# Patient Record
Sex: Female | Born: 1995
Health system: Southern US, Community
[De-identification: ages and names within clinical notes are randomized; demographics above are authoritative.]

## PROBLEM LIST (undated history)

## (undated) ENCOUNTER — Emergency Department (HOSPITAL_BASED_OUTPATIENT_CLINIC_OR_DEPARTMENT_OTHER): Admission: EM | Payer: No Typology Code available for payment source | Source: Home / Self Care

## (undated) ENCOUNTER — Emergency Department (HOSPITAL_BASED_OUTPATIENT_CLINIC_OR_DEPARTMENT_OTHER): Admission: EM | Payer: PRIVATE HEALTH INSURANCE | Source: Home / Self Care

## (undated) DIAGNOSIS — IMO0001 Reserved for inherently not codable concepts without codable children: Secondary | ICD-10-CM

## (undated) DIAGNOSIS — J45909 Unspecified asthma, uncomplicated: Secondary | ICD-10-CM

## (undated) DIAGNOSIS — K219 Gastro-esophageal reflux disease without esophagitis: Secondary | ICD-10-CM

## (undated) DIAGNOSIS — K900 Celiac disease: Secondary | ICD-10-CM

## (undated) DIAGNOSIS — E669 Obesity, unspecified: Secondary | ICD-10-CM

## (undated) HISTORY — PX: NO PAST SURGERIES: SHX2092

---
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
## 2015-01-27 ENCOUNTER — Emergency Department (HOSPITAL_BASED_OUTPATIENT_CLINIC_OR_DEPARTMENT_OTHER)
Admission: EM | Admit: 2015-01-27 | Discharge: 2015-01-27 | Disposition: A | Discharge status: Home / Self Care | Payer: Managed Care, Other (non HMO) | Attending: Emergency Medicine | Admitting: Emergency Medicine

## 2015-01-27 ENCOUNTER — Encounter (HOSPITAL_BASED_OUTPATIENT_CLINIC_OR_DEPARTMENT_OTHER): Payer: Self-pay | Admitting: *Deleted

## 2015-01-27 DIAGNOSIS — R110 Nausea: Secondary | ICD-10-CM | POA: Insufficient documentation

## 2015-01-27 DIAGNOSIS — R1033 Periumbilical pain: Secondary | ICD-10-CM | POA: Diagnosis not present

## 2015-01-27 DIAGNOSIS — R1013 Epigastric pain: Secondary | ICD-10-CM | POA: Diagnosis not present

## 2015-01-27 DIAGNOSIS — J45909 Unspecified asthma, uncomplicated: Secondary | ICD-10-CM | POA: Insufficient documentation

## 2015-01-27 DIAGNOSIS — Z3202 Encounter for pregnancy test, result negative: Secondary | ICD-10-CM | POA: Insufficient documentation

## 2015-01-27 DIAGNOSIS — R630 Anorexia: Secondary | ICD-10-CM | POA: Diagnosis not present

## 2015-01-27 HISTORY — DX: Unspecified asthma, uncomplicated: J45.909

## 2015-01-27 LAB — COMPREHENSIVE METABOLIC PANEL
ALT: 11 U/L — ABNORMAL LOW (ref 14–54)
AST: 17 U/L (ref 15–41)
Albumin: 3.8 g/dL (ref 3.5–5.0)
Alkaline Phosphatase: 73 U/L (ref 38–126)
Anion gap: 7 (ref 5–15)
BUN: 8 mg/dL (ref 6–20)
CO2: 27 mmol/L (ref 22–32)
Calcium: 8.7 mg/dL — ABNORMAL LOW (ref 8.9–10.3)
Chloride: 107 mmol/L (ref 101–111)
Creatinine, Ser: 0.72 mg/dL (ref 0.44–1.00)
GFR calc Af Amer: >60 mL/min (ref >60)
GFR calc non Af Amer: >60 mL/min (ref >60)
Glucose, Bld: 100 mg/dL — ABNORMAL HIGH (ref 65–99)
Potassium: 3.5 mmol/L (ref 3.5–5.1)
Sodium: 141 mmol/L (ref 135–145)
Total Bilirubin: 0.5 mg/dL (ref 0.3–1.2)
Total Protein: 7.5 g/dL (ref 6.5–8.1)

## 2015-01-27 LAB — CBC WITH DIFFERENTIAL/PLATELET
Basophils Absolute: 0 10*3/uL (ref 0.0–0.1)
Basophils Relative: 0 %
Eosinophils Absolute: 0.1 10*3/uL (ref 0.0–0.7)
Eosinophils Relative: 2 %
HCT: 36.2 % (ref 36.0–46.0)
Hemoglobin: 11.6 g/dL — ABNORMAL LOW (ref 12.0–15.0)
Lymphocytes Relative: 25 %
Lymphs Abs: 1.8 10*3/uL (ref 0.7–4.0)
MCH: 26.6 pg (ref 26.0–34.0)
MCHC: 32 g/dL (ref 30.0–36.0)
MCV: 83 fL (ref 78.0–100.0)
Monocytes Absolute: 0.6 10*3/uL (ref 0.1–1.0)
Monocytes Relative: 9 %
Neutro Abs: 4.7 10*3/uL (ref 1.7–7.7)
Neutrophils Relative %: 64 %
Platelets: 341 10*3/uL (ref 150–400)
RBC: 4.36 MIL/uL (ref 3.87–5.11)
RDW: 13.5 % (ref 11.5–15.5)
WBC: 7.2 10*3/uL (ref 4.0–10.5)

## 2015-01-27 LAB — URINALYSIS, ROUTINE W REFLEX MICROSCOPIC
Bilirubin Urine: NEGATIVE
Glucose, UA: NEGATIVE mg/dL
Hgb urine dipstick: NEGATIVE
Ketones, ur: NEGATIVE mg/dL
Nitrite: NEGATIVE
Protein, ur: NEGATIVE mg/dL
Specific Gravity, Urine: 1.028 (ref 1.005–1.030)
Urobilinogen, UA: 0.2 mg/dL (ref 0.0–1.0)
pH: 6 (ref 5.0–8.0)

## 2015-01-27 LAB — LIPASE, BLOOD: Lipase: 25 U/L (ref 11–51)

## 2015-01-27 LAB — URINE MICROSCOPIC-ADD ON

## 2015-01-27 LAB — PREGNANCY, URINE: Preg Test, Ur: NEGATIVE

## 2015-01-27 MED ORDER — GI COCKTAIL ~~LOC~~
30.0000 mL | Freq: Once | ORAL | Status: AC
  Administered 2015-01-27: 30 mL via ORAL
  Filled 2015-01-27: qty 30

## 2015-01-27 MED ORDER — ONDANSETRON HCL 4 MG PO TABS: 4.0000 mg | ORAL_TABLET | Freq: Four times a day (QID) | ORAL | Status: DC

## 2015-01-27 MED ORDER — RANITIDINE HCL 150 MG PO TABS: 150.0000 mg | ORAL_TABLET | Freq: Two times a day (BID) | ORAL | Status: DC

## 2015-01-27 MED ORDER — ONDANSETRON 4 MG PO TBDP
4.0000 mg | ORAL_TABLET | Freq: Once | ORAL | Status: AC
  Administered 2015-01-27: 4 mg via ORAL
  Filled 2015-01-27: qty 1

## 2015-01-27 NOTE — ED Notes (Signed)
Attempt x1 to start IV. 

## 2015-01-27 NOTE — Discharge Instructions (Signed)

## 2015-01-27 NOTE — ED Notes (Signed)
C/o mid abd pain to above umbilicus onset 0500 this am. Nausea, no vomiting. No problems urinating or discharge.

## 2015-01-27 NOTE — ED Notes (Signed)
Patient preparing for discharge. 

## 2015-01-27 NOTE — ED Provider Notes (Signed)
CSN: [id number]     Arrival date & time 01/27/15  0865 History   First MD Initiated Contact with Patient 01/27/15 [phone]     No chief complaint on file.    (Consider location/radiation/quality/duration/timing/severity/associated sxs/prior Treatment) Patient is a 19 y.o. female presenting with abdominal pain. The history is provided by the patient.  Abdominal Pain Pain location:  Epigastric and periumbilical Pain quality: aching, gnawing and shooting   Pain radiates to:  Does not radiate Pain severity:  Severe Onset quality:  Sudden Duration:  2 hours Timing:  Constant Progression:  Worsening Chronicity:  New Context: awakening from sleep   Relieved by:  Nothing Exacerbated by: drank some cold water and it got much worse. Ineffective treatments:  None tried Associated symptoms: anorexia and nausea   Associated symptoms: no chest pain, no chills, no constipation, no cough, no diarrhea, no dysuria, no fever, no hematuria, no shortness of breath, no vaginal bleeding, no vaginal discharge and no vomiting   Risk factors: no alcohol abuse, has not had multiple surgeries and no NSAID use   Risk factors comment:  LMP was sept 7th.  normally menses are normal but neg pregnancy test at home.   Past Medical History  Diagnosis Date  . Asthma    History reviewed. No pertinent past surgical history. No family history on file. Social History  Substance Use Topics  . Smoking status: Passive Smoke Exposure - Never Smoker  . Smokeless tobacco: Never Used  . Alcohol Use: No   OB History    No data available     Review of Systems  Constitutional: Negative for fever and chills.  Respiratory: Negative for cough and shortness of breath.   Cardiovascular: Negative for chest pain.  Gastrointestinal: Positive for nausea, abdominal pain and anorexia. Negative for vomiting, diarrhea and constipation.  Genitourinary: Negative for dysuria, hematuria, vaginal bleeding and vaginal discharge.  All  other systems reviewed and are negative.     Allergies  Peach  Home Medications   Prior to Admission medications   Not on File   BP 118/79 mmHg  Pulse 96  Temp(Src) 98.6 F (37 C) (Oral)  Resp 18  Ht  (1.575 m)  Wt 194 lb (87.998 kg)  BMI 35.47 kg/m2  SpO2 100%  LMP 12/04/2014 Physical Exam  Constitutional: She is oriented to person, place, and time. She appears well-developed and well-nourished. No distress.  HENT:  Head: Normocephalic and atraumatic.  Mouth/Throat: Oropharynx is clear and moist.  Eyes: Conjunctivae and EOM are normal. Pupils are equal, round, and reactive to light.  Neck: Normal range of motion. Neck supple.  Cardiovascular: Normal rate, regular rhythm and intact distal pulses.   No murmur heard. Pulmonary/Chest: Effort normal and breath sounds normal. No respiratory distress. She has no wheezes. She has no rales.  Abdominal: Soft. Normal appearance. She exhibits no distension. There is tenderness in the epigastric area and periumbilical area. There is guarding. There is no rebound, no CVA tenderness and negative Murphy's sign.  Musculoskeletal: Normal range of motion. She exhibits no edema or tenderness.  Neurological: She is alert and oriented to person, place, and time.  Skin: Skin is warm and dry. No rash noted. No erythema.  Psychiatric: She has a normal mood and affect. Her behavior is normal.  Nursing note and vitals reviewed.   ED Course  Procedures (including critical care time) Labs Review Labs Reviewed  URINALYSIS, ROUTINE W REFLEX MICROSCOPIC (NOT AT [facility]) - Abnormal; Notable for the following:  APPearance CLOUDY (*)    Leukocytes, UA SMALL (*)    All other components within normal limits  CBC WITH DIFFERENTIAL/PLATELET - Abnormal; Notable for the following:    Hemoglobin 11.6 (*)    All other components within normal limits  COMPREHENSIVE METABOLIC PANEL - Abnormal; Notable for the following:    Glucose, Bld 100 (*)     Calcium 8.7 (*)    ALT 11 (*)    All other components within normal limits  URINE MICROSCOPIC-ADD ON - Abnormal; Notable for the following:    Squamous Epithelial / LPF FEW (*)    Bacteria, UA FEW (*)    All other components within normal limits  PREGNANCY, URINE  LIPASE, BLOOD    Imaging Review No results found. I have personally reviewed and evaluated these images and lab results as part of my medical decision-making.   EKG Interpretation None      MDM   Final diagnoses:  Abdominal pain, acute, epigastric    Pt is an 19 y/o female who presents today with epigastric and periumbilical abd pain starting at 5am.  Pt appears uncomfortable on exam with significant tenderness mostly in the epigastrium.  No prior sx of similar.  Felt fine when she went to bed.  Denies fever, constipation or diarrhea.  LMP sept 7th with some spotting on Oct 28th but neg pregnancy test at home.  Pt states she normally has regular periods.  No vag bleeding, d/c, dysuria or other GU complaints today.  No NSAID or ASA use.  No prior surgery.  No EtOH, tobacco or drug use.  Concern for possible gastritis vs cholecystitis vs pancreatitis.  Also pt may be pregnant.  Low suspicion for pyelo, UTI, STD, appy or diverticulitis as the cause of her sx today.  Low suspicion for heart or lung pathology including MI, dissection, PE or PNA.  CBC, CMP, Lipase, UA, UPT pending.  Pt given GI cocktail and odt zofran  8:08 AM Labs are all wnl.  UPT is neg.  After GI cocktail pt tolerating po's and feeling better.  Will d/c home with antacid  Gwyneth SproutWhitney Teressa Mcglocklin, MD 01/27/15 [phone]

## 2015-02-20 ENCOUNTER — Encounter (HOSPITAL_BASED_OUTPATIENT_CLINIC_OR_DEPARTMENT_OTHER): Payer: Self-pay | Admitting: Emergency Medicine

## 2015-02-20 ENCOUNTER — Emergency Department (HOSPITAL_BASED_OUTPATIENT_CLINIC_OR_DEPARTMENT_OTHER)
Admission: EM | Admit: 2015-02-20 | Discharge: 2015-02-20 | Disposition: A | Discharge status: Home / Self Care | Payer: Managed Care, Other (non HMO) | Attending: Emergency Medicine | Admitting: Emergency Medicine

## 2015-02-20 DIAGNOSIS — J029 Acute pharyngitis, unspecified: Secondary | ICD-10-CM | POA: Diagnosis present

## 2015-02-20 DIAGNOSIS — J040 Acute laryngitis: Secondary | ICD-10-CM

## 2015-02-20 DIAGNOSIS — J069 Acute upper respiratory infection, unspecified: Secondary | ICD-10-CM | POA: Diagnosis not present

## 2015-02-20 DIAGNOSIS — J45909 Unspecified asthma, uncomplicated: Secondary | ICD-10-CM | POA: Insufficient documentation

## 2015-02-20 LAB — RAPID STREP SCREEN (MED CTR MEBANE ONLY): Streptococcus, Group A Screen (Direct): NEGATIVE

## 2015-02-20 MED ORDER — LORATADINE-PSEUDOEPHEDRINE ER 5-120 MG PO TB12: 1.0000 | ORAL_TABLET | Freq: Two times a day (BID) | ORAL | Status: DC

## 2015-02-20 MED ORDER — IBUPROFEN 800 MG PO TABS: 800.0000 mg | ORAL_TABLET | Freq: Three times a day (TID) | ORAL | Status: DC

## 2015-02-20 MED ORDER — IBUPROFEN 800 MG PO TABS
800.0000 mg | ORAL_TABLET | Freq: Once | ORAL | Status: AC
  Administered 2015-02-20: 800 mg via ORAL
  Filled 2015-02-20: qty 1

## 2015-02-20 NOTE — ED Notes (Signed)
Awoke with sore throat and hoarseness this am

## 2015-02-20 NOTE — ED Provider Notes (Signed)
CSN: [id number]     Arrival date & time 02/20/15  [id number] History   First MD Initiated Contact with Patient 02/20/15 1042     Chief Complaint  Patient presents with  . Sore Throat     (Consider location/radiation/quality/duration/timing/severity/associated sxs/prior Treatment) HPI Patient states she's had a sore throat starting today. Her voice is very hoarse. She reports it hurts down lower in her throat. No fever that she is aware. She reports she's had just mild nasal drainage and discharge. Slight associated cough but no chest pain or shortness of breath. No difficulty swallowing fluids or breathing. She reports that her cousin got diagnosed with strep throat several weeks ago. Past Medical History  Diagnosis Date  . Asthma    History reviewed. No pertinent past surgical history. History reviewed. No pertinent family history. Social History  Substance Use Topics  . Smoking status: Passive Smoke Exposure - Never Smoker  . Smokeless tobacco: Never Used  . Alcohol Use: No   OB History    No data available     Review of Systems 10 Systems reviewed and are negative for acute change except as noted in the HPI.    Allergies  [place]  Home Medications   Prior to Admission medications   Medication Sig Start Date End Date Taking? Authorizing Provider  ibuprofen (ADVIL,MOTRIN) 800 MG tablet Take 1 tablet (800 mg total) by mouth 3 (three) times daily. 02/20/15   Arby BarretteMarcy Ladashia Demarinis, MD  loratadine-pseudoephedrine (CLARITIN-D 12 HOUR) 5-120 MG tablet Take 1 tablet by mouth 2 (two) times daily. 02/20/15   Arby BarretteMarcy Emmalise Huard, MD   BP 122/77 mmHg  Pulse 88  Temp(Src) 99 F (37.2 C) (Oral)  Resp 20  Ht 5\' 2"  (1.575 m)  Wt 194 lb (87.998 kg)  BMI 35.47 kg/m2  SpO2 100%  LMP 12/04/2014 Physical Exam  Constitutional: She is oriented to person, place, and time. She appears well-developed and well-nourished.  HENT:  Head: Normocephalic and atraumatic.  Mouth/Throat: Oropharynx is clear and  moist.  Voice is hoarse laryngitic. No stridor or respiratory distress. Posterior oropharynx is widely patent without any erythema or exudate on the tonsillar pillars. His memories are pink and moist. Lateral TMs are normal.  Eyes: EOM are normal. Pupils are equal, round, and reactive to light.  Neck: Neck supple.  Cardiovascular: Normal rate, regular rhythm, normal heart sounds and intact distal pulses.   Pulmonary/Chest: Effort normal and breath sounds normal.  Abdominal: Soft. Bowel sounds are normal. She exhibits no distension. There is no tenderness.  Musculoskeletal: Normal range of motion. She exhibits no edema.  Lymphadenopathy:    She has no cervical adenopathy.  Neurological: She is alert and oriented to person, place, and time. She has normal strength. Coordination normal. GCS eye subscore is 4. GCS verbal subscore is 5. GCS motor subscore is 6.  Skin: Skin is warm, dry and intact.  Psychiatric: She has a normal mood and affect.    ED Course  Procedures (including critical care time) Labs Review Labs Reviewed  RAPID STREP SCREEN (NOT AT [facility])  CULTURE, GROUP A STREP    Imaging Review No results found. I have personally reviewed and evaluated these images and lab results as part of my medical decision-making.   EKG Interpretation None      MDM   Final diagnoses:  Laryngitis  URI, acute   Patient is well in appearance. No stridor, no difficulty handling secretions and no short of breath. At this time findings are most suggestive  of a viral URI with laryngitis. Rapid strep is negative and patient does not have erythema or exudate. She is counseled on pending culture. She does advise of a positive strep contact approximately 2 weeks ago, however at this time based on clinical findings and negative rapid strep I do not feel that appear antibiotics are indicated. Patient is prescribed Motrin and Claritin-D for symptoms.    Arby Barrette, MD 02/20/15 1106

## 2015-02-20 NOTE — Discharge Instructions (Signed)
Laryngitis Laryngitis is inflammation of your vocal cords. This causes hoarseness, coughing, loss of voice, sore throat, or a dry throat. Your vocal cords are two bands of muscles that are found in your throat. When you speak, these cords come together and vibrate. These vibrations come out through your mouth as sound. When your vocal cords are inflamed, your voice sounds different. Laryngitis can be temporary (acute) or long-term (chronic). Most cases of acute laryngitis improve with time. Chronic laryngitis is laryngitis that lasts for more than three weeks. CAUSES Acute laryngitis may be caused by:  A viral infection.  Lots of talking, yelling, or singing. This is also called vocal strain.  Bacterial infections. Chronic laryngitis may be caused by:  Vocal strain.  Injury to your vocal cords.  Acid reflux (gastroesophageal reflux disease or GERD).  Allergies.  Sinus infection.  Smoking.  Alcohol abuse.  Breathing in chemicals or dust.  Growths on the vocal cords. RISK FACTORS Risk factors for laryngitis include:  Smoking.  Alcohol abuse.  Having allergies. SIGNS AND SYMPTOMS Symptoms of laryngitis may include:  Low, hoarse voice.  Loss of voice.  Dry cough.  Sore throat.  Stuffy nose. DIAGNOSIS Laryngitis may be diagnosed by:  Physical exam.  Throat culture.  Blood test.  Laryngoscopy. This procedure allows your health care provider to look at your vocal cords with a mirror or viewing tube. TREATMENT Treatment for laryngitis depends on what is causing it. Usually, treatment involves resting your voice and using medicines to soothe your throat. However, if your laryngitis is caused by a bacterial infection, you may need to take antibiotic medicine. If your laryngitis is caused by a growth, you may need to have a procedure to remove it. HOME CARE INSTRUCTIONS  Drink enough fluid to keep your urine clear or pale yellow.  Breathe in moist air. Use a  humidifier if you live in a dry climate.  Take medicines only as directed by your health care provider.  If you were prescribed an antibiotic medicine, finish it all even if you start to feel better.  Do not smoke cigarettes or electronic cigarettes. If you need help quitting, ask your health care provider.  Talk as little as possible. Also avoid whispering, which can cause vocal strain.  Write instead of talking. Do this until your voice is back to normal. SEEK MEDICAL CARE IF:  You have a fever.  You have increasing pain.  You have difficulty swallowing. SEEK IMMEDIATE MEDICAL CARE IF:  You cough up blood.  You have trouble breathing.   This information is not intended to replace advice given to you by your health care provider. Make sure you discuss any questions you have with your health care provider.   Document Released: 03/05/2005 Document Revised: 03/26/2014 Document Reviewed: 08/18/2013 Elsevier Interactive Patient Education 2016 Elsevier Inc. Upper Respiratory Infection, Adult Most upper respiratory infections (URIs) are a viral infection of the air passages leading to the lungs. A URI affects the nose, throat, and upper air passages. The most common type of URI is nasopharyngitis and is typically referred to as "the common cold." URIs run their course and usually go away on their own. Most of the time, a URI does not require medical attention, but sometimes a bacterial infection in the upper airways can follow a viral infection. This is called a secondary infection. Sinus and middle ear infections are common types of secondary upper respiratory infections. Bacterial pneumonia can also complicate a URI. A URI can worsen asthma  and chronic obstructive pulmonary disease (COPD). Sometimes, these complications can require emergency medical care and may be life threatening.  CAUSES Almost all URIs are caused by viruses. A virus is a type of germ and can spread from one person  to another.  RISKS FACTORS You may be at risk for a URI if:   You smoke.   You have chronic heart or lung disease.  You have a weakened defense (immune) system.   You are very young or very old.   You have nasal allergies or asthma.  You work in crowded or poorly ventilated areas.  You work in health care facilities or schools. SIGNS AND SYMPTOMS  Symptoms typically develop 2-3 days after you come in contact with a cold virus. Most viral URIs last 7-10 days. However, viral URIs from the influenza virus (flu virus) can last 14-18 days and are typically more severe. Symptoms may include:   Runny or stuffy (congested) nose.   Sneezing.   Cough.   Sore throat.   Headache.   Fatigue.   Fever.   Loss of appetite.   Pain in your forehead, behind your eyes, and over your cheekbones (sinus pain).  Muscle aches.  DIAGNOSIS  Your health care provider may diagnose a URI by:  Physical exam.  Tests to check that your symptoms are not due to another condition such as:  Strep throat.  Sinusitis.  Pneumonia.  Asthma. TREATMENT  A URI goes away on its own with time. It cannot be cured with medicines, but medicines may be prescribed or recommended to relieve symptoms. Medicines may help:  Reduce your fever.  Reduce your cough.  Relieve nasal congestion. HOME CARE INSTRUCTIONS   Take medicines only as directed by your health care provider.   Gargle warm saltwater or take cough drops to comfort your throat as directed by your health care provider.  Use a warm mist humidifier or inhale steam from a shower to increase air moisture. This may make it easier to breathe.  Drink enough fluid to keep your urine clear or pale yellow.   Eat soups and other clear broths and maintain good nutrition.   Rest as needed.   Return to work when your temperature has returned to normal or as your health care provider advises. You may need to stay home longer to avoid  infecting others. You can also use a face mask and careful hand washing to prevent spread of the virus.  Increase the usage of your inhaler if you have asthma.   Do not use any tobacco products, including cigarettes, chewing tobacco, or electronic cigarettes. If you need help quitting, ask your health care provider. PREVENTION  The best way to protect yourself from getting a cold is to practice good hygiene.   Avoid oral or hand contact with people with cold symptoms.   Wash your hands often if contact occurs.  There is no clear evidence that vitamin C, vitamin E, echinacea, or exercise reduces the chance of developing a cold. However, it is always recommended to get plenty of rest, exercise, and practice good nutrition.  SEEK MEDICAL CARE IF:   You are getting worse rather than better.   Your symptoms are not controlled by medicine.   You have chills.  You have worsening shortness of breath.  You have brown or red mucus.  You have yellow or brown nasal discharge.  You have pain in your face, especially when you bend forward.  You have a fever.  You have swollen neck glands.  You have pain while swallowing.  You have white areas in the back of your throat. SEEK IMMEDIATE MEDICAL CARE IF:   You have severe or persistent:  Headache.  Ear pain.  Sinus pain.  Chest pain.  You have chronic lung disease and any of the following:  Wheezing.  Prolonged cough.  Coughing up blood.  A change in your usual mucus.  You have a stiff neck.  You have changes in your:  Vision.  Hearing.  Thinking.  Mood. MAKE SURE YOU:   Understand these instructions.  Will watch your condition.  Will get help right away if you are not doing well or get worse.   This information is not intended to replace advice given to you by your health care provider. Make sure you discuss any questions you have with your health care provider.   Document Released: 08/29/2000  Document Revised: 07/20/2014 Document Reviewed: 06/10/2013 Elsevier Interactive Patient Education 2016 [employer].  [employer] Vaporizers Vaporizers may help relieve the symptoms of a cough and cold. They add moisture to the air, which helps mucus to become thinner and less sticky. This makes it easier to breathe and cough up secretions. Cool mist vaporizers do not cause serious burns like hot mist vaporizers, which may also be called steamers or humidifiers. Vaporizers have not been proven to help with colds. You should not use a vaporizer if you are allergic to mold. HOME CARE INSTRUCTIONS  Follow the package instructions for the vaporizer.  Do not use anything other than distilled water in the vaporizer.  Do not run the vaporizer all of the time. This can cause mold or bacteria to grow in the vaporizer.  Clean the vaporizer after each time it is used.  Clean and dry the vaporizer well before storing it.  Stop using the vaporizer if worsening respiratory symptoms develop.   This information is not intended to replace advice given to you by your health care provider. Make sure you discuss any questions you have with your health care provider.   Document Released: 12/01/2003 Document Revised: 03/10/2013 Document Reviewed: 07/23/2012 Elsevier Interactive Patient Education 2016 [employer].  Emergency Department Resource Guide 1) Find a Doctor and Pay Out of Pocket Although you won't have to find out who is covered by your insurance plan, it is a good idea to ask around and get recommendations. You will then need to call the office and see if the doctor you have chosen will accept you as a new patient and what types of options they offer for patients who are self-pay. Some doctors offer discounts or will set up payment plans for their patients who do not have insurance, but you will need to ask so you aren't surprised when you get to your appointment.  2) Contact Your Local Health  Department Not all health departments have doctors that can see patients for sick visits, but many do, so it is worth a call to see if yours does. If you don't know where your local health department is, you can check in your phone book. The CDC also has a tool to help you locate your state's health department, and many state websites also have listings of all of their local health departments.  3) Find a Walk-in Clinic If your illness is not likely to be very severe or complicated, you may want to try a walk in clinic. These are popping up all over the country in pharmacies, drugstores,  and shopping centers. They're usually staffed by nurse practitioners or physician assistants that have been trained to treat common illnesses and complaints. They're usually fairly quick and inexpensive. However, if you have serious medical issues or chronic medical problems, these are probably not your best option.  No Primary Care Doctor: - Call Health Connect at  [phone] - they can help you locate a primary care doctor that  accepts your insurance, provides certain services, etc. - Physician Referral Service- [phone]  Chronic Pain Problems: Organization         Address  Phone   Notes  [facility]  [phone] Patients need to be referred by their primary care doctor.   Medication Assistance: Organization         Address  Phone   Notes  [facility] Med[facility] [address] [place] [place], [place] [address] [phone] --Must be a resident of [facility] -- Must have NO insurance coverage whatsoever (no Medicaid/ Medicare, etc.) -- The pt. MUST have a primary care doctor that directs their care regularly and follows them in the community   [facility]  [phone]   [employer]  [phone]    Agencies that provide inexpensive medical care: Organization         Address  Phone   Notes  [facility]  [phone]   [facility]    [phone]   [facility] [address] [place], [place] [address] [phone]   [facility] [address] [place] [phone]   Planned Parenthood    [phone]   [facility]    [phone]   [facility]  [address], [place] Phone:  [phone], Fax:  [phone] Hours of Operation:  9 am - 6 pm, M-F.  Also accepts Medicaid/Medicare and self-pay.  [facility]  [address], [place] Phone: [phone], Fax: [phone]. Hours of Operation:  8:30 am - 5:30 pm, M-F.  Also accepts Medicaid and self-pay.  [facility] [place] [address] [place] Phone: [phone]   [facility] [address] Natasha Bence [place], [place] [phone], Ext. [phone] Mondays & Thursdays: 7-9 AM.  First 15 patients are seen on a first come, first serve basis.    Medicaid-accepting [facility] Providers:  Organization         Address  Phone   Notes  [facility] [address], [place] [phone] Also accepts self-pay patients.  [facility] [address] Laurell Josephs [place], [place]  [phone]   [facility] [address], [place] [phone]   [facility] [address] [place] [phone]   Renaye Rakers [address], [place]   [phone] Only accepts [place] patients after they have their name applied to their card.   Self-Pay (no insurance) in [facility]:  Organization         Address  Phone   Notes  Sickle Cell Patients, [place] [facility] [address] [place], [place] [phone]   [facility] [address] [place], [place] [phone]   [facility] [place]  [address],  [place] [phone]  [phone]   [facility]/Dr. Julio Sicks  [address] [place] or [address], [place] [phone] Phone number for both [place] and [place] locations is the same.  [facility] [address] [place] [phone]   [facility] [address], [place] or [address] [phone]   [facility] [address], [place] [phone], phone; [phone], fax Sees patients 1st and 3rd Saturday of every month.  Must not qualify for public or private insurance (i.e. Medicaid, Medicare, [place] Health Choice, Veterans' Benefits)  Household income should be no more than 200% of the poverty level The clinic cannot treat you if you are pregnant or think you are pregnant  Sexually transmitted diseases are not treated at the clinic.    Dental Care: Organization         Address  Phone  Notes  [facility] [address] [place], [place] [phone] Accepts children up to age 64 who are enrolled in [place] or [place] Health Choice; pregnant women with a Medicaid card; and children who have applied for Medicaid or [place] Health Choice, but were declined, whose parents can pay a reduced fee at time of service.  [facility]  [address], [place] [phone] Accepts children up to age 78 who are enrolled in [place] or [place] Health Choice; pregnant women with a Medicaid card; and children who have applied for Medicaid or [place] Health Choice, but were declined, whose parents can pay a reduced fee at time of service.  [facility]  [address] [place], [place] [phone] Patients are seen by appointment only. Walk-ins are not accepted. [facility] will see patients 83 years of age and older. Monday - Tuesday (8am-5pm) Most Wednesdays  (8:30-5pm) $30 per visit, cash only  [facility]  [address], [facility] [phone] Patients are seen by appointment only. Walk-ins are not accepted. [facility] will see patients 56 years of age and older. One Wednesday Evening (Monthly: Volunteer Based).  $30 per visit, cash only  [employer]  [phone] for adults; Children under age 8, call Graduate Pediatric Dentistry at [phone]. Children aged 30-14, please call [phone] to request a pediatric application.  Dental services are provided in all areas of dental care including fillings, crowns and bridges, complete and partial dentures, implants, gum treatment, root canals, and extractions. Preventive care is also provided. Treatment is provided to both adults and children. Patients are selected via a lottery and there is often a waiting list.   [facility] [address] [place]  [phone] [email or web address]   [facility] [address] [place], [place] [phone], Ext. [phone] Second and Fourth Thursday of each month, opens at 6:30 AM; Clinic ends at 9 AM.  Patients are seen on a first-come first-served basis, and a limited number are seen during each clinic.   [facility]  [address] Ether Griffins [place], [place] [phone]   Eligibility Requirements You must have lived in [place], [place], or [place] counties for at least the last three months.   You cannot be eligible for state or federal sponsored [place], including [employer], [place], or [employer].   You generally cannot be eligible for  healthcare insurance through your employer.    How to apply: Eligibility screenings are held every Tuesday and Wednesday afternoon from 1:00 pm until 4:00 pm. You do not need an appointment for the interview!  [facility] [address], [place], [place] [phone]   [facility]  Health Department  [phone]   [facility] Health Department  [phone]   [facility] Health Department  [phone]    Behavioral Health Resources in the Community: Intensive Outpatient Programs Organization         Address  Phone  Notes  [facility] Services [address], [place], [place] [phone]   [facility] Outpatient [address], [place], [place] [phone]   [facility] [address] [place], [place]  [phone]   [facility] [address]  [place], [place] [phone] or [phone]   Substance Abuse Resources Organization         Address  Phone  Notes  [facility]  [phone]   [facility]  [phone]   [facility]  [phone]   Floydene Flock  [phone]   Residential & Outpatient Substance Abuse Program  [phone]   Psychological Services Organization         Address  Phone  Notes  [facility]  [phone]   [facility] Services  [phone]   [facility] Mental Health [address] [place] [phone] or [phone]    Mobile Crisis Teams Organization         Address  Phone  Notes  [facility], Mobile Crisis Care Unit  [phone]   [facility]  [address] [place], [place] [phone]   Doristine Locks [address] [place] [phone]    Self-Help/Support Groups Organization         Address  Phone             Notes  Mental Health Assoc. of  - variety of support groups  336- I7437963 Call for more information  Narcotics Anonymous (NA), [facility] [address], [employer] [place]  2 meetings at this location   Statistician         Address  Phone  Notes  [facility] [address],    [place]  [phone]   [facility]  [address], [place] [address], [place], [place]  [phone]   [facility] [address] [place], [place] [phone] Admissions: 8am-3pm M-F  [facility] [address],    [place], [place] [phone]   [facility] [address] [place], [place], [place] [phone]   [facility] [address] [place] [address],  [place], [place] [phone]   [facility] - Intensive Outpatient [address] Laurell Joseph[address], [place], [place] [phone]   [facility] ([facility] [address] [place]  [place], [place] [phone] or [phone]   [facility] ([facility]) [address], [place], [place] [phone] Accepts Medicaid  [facility] [address],  [place] [phone] Substance Abuse/Addiction Treatment   [facility] Organization         Address  Phone  Notes  [facility]  [phone]   Angie Fava, PhD [address] Ervin Knack [place], [place]   [phone] or [phone]   [facility]   [address] [place], [place] (  [phone]   [facility] [address] [place], [place] [phone] Insurance/Medicaid/sponsorship through [employer] and Families [address]                                    [place], [place] [phone] Therapy/tele-psych/case  [facility] [address]   [place], [place] [phone]    Dr. Lolly Mustache  [phone]   [facility]  [employer] [facility] Dept. 1) [address] [place] 2) [address] [place] 3)  [address], [place] [phone]  [phone]   [facility] Child Abuse Hotline 678-217-1891 or [phone] (After Hours)

## 2015-02-22 LAB — CULTURE, GROUP A STREP: Strep A Culture: NEGATIVE

## 2015-03-16 ENCOUNTER — Encounter (HOSPITAL_BASED_OUTPATIENT_CLINIC_OR_DEPARTMENT_OTHER): Payer: Self-pay | Admitting: *Deleted

## 2015-03-16 ENCOUNTER — Emergency Department (HOSPITAL_BASED_OUTPATIENT_CLINIC_OR_DEPARTMENT_OTHER)
Admission: EM | Admit: 2015-03-16 | Discharge: 2015-03-16 | Disposition: A | Discharge status: Home / Self Care | Payer: Managed Care, Other (non HMO) | Attending: Emergency Medicine | Admitting: Emergency Medicine

## 2015-03-16 DIAGNOSIS — J45909 Unspecified asthma, uncomplicated: Secondary | ICD-10-CM | POA: Insufficient documentation

## 2015-03-16 DIAGNOSIS — Z79899 Other long term (current) drug therapy: Secondary | ICD-10-CM | POA: Insufficient documentation

## 2015-03-16 DIAGNOSIS — K529 Noninfective gastroenteritis and colitis, unspecified: Secondary | ICD-10-CM | POA: Insufficient documentation

## 2015-03-16 DIAGNOSIS — A599 Trichomoniasis, unspecified: Secondary | ICD-10-CM

## 2015-03-16 DIAGNOSIS — Z791 Long term (current) use of non-steroidal anti-inflammatories (NSAID): Secondary | ICD-10-CM | POA: Insufficient documentation

## 2015-03-16 DIAGNOSIS — Z3202 Encounter for pregnancy test, result negative: Secondary | ICD-10-CM | POA: Insufficient documentation

## 2015-03-16 DIAGNOSIS — R112 Nausea with vomiting, unspecified: Secondary | ICD-10-CM | POA: Diagnosis present

## 2015-03-16 DIAGNOSIS — A5901 Trichomonal vulvovaginitis: Secondary | ICD-10-CM | POA: Insufficient documentation

## 2015-03-16 LAB — URINALYSIS, ROUTINE W REFLEX MICROSCOPIC
Bilirubin Urine: NEGATIVE
Glucose, UA: NEGATIVE mg/dL
Hgb urine dipstick: NEGATIVE
Ketones, ur: NEGATIVE mg/dL
Nitrite: NEGATIVE
Protein, ur: 30 mg/dL — AB
Specific Gravity, Urine: 1.023 (ref 1.005–1.030)
pH: 8.5 — ABNORMAL HIGH (ref 5.0–8.0)

## 2015-03-16 LAB — URINE MICROSCOPIC-ADD ON: RBC / HPF: NONE SEEN RBC/hpf (ref 0–5)

## 2015-03-16 LAB — PREGNANCY, URINE: Preg Test, Ur: NEGATIVE

## 2015-03-16 MED ORDER — ONDANSETRON HCL 4 MG PO TABS: 4.0000 mg | ORAL_TABLET | Freq: Four times a day (QID) | ORAL | Status: DC

## 2015-03-16 MED ORDER — ONDANSETRON 4 MG PO TBDP
4.0000 mg | ORAL_TABLET | Freq: Once | ORAL | Status: AC
  Administered 2015-03-16: 4 mg via ORAL
  Filled 2015-03-16: qty 1

## 2015-03-16 MED ORDER — PROMETHAZINE HCL 25 MG RE SUPP: 25.0000 mg | Freq: Four times a day (QID) | RECTAL | Status: DC | PRN

## 2015-03-16 MED ORDER — METRONIDAZOLE 500 MG PO TABS: 500.0000 mg | ORAL_TABLET | Freq: Two times a day (BID) | ORAL | Status: DC

## 2015-03-16 NOTE — ED Notes (Signed)
PT tolerated sprite.

## 2015-03-16 NOTE — ED Notes (Signed)
C/o n/v/d since 0100 this am. Abd pain in mid abd. 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 No fever no problems with urination.

## 2015-03-16 NOTE — ED Provider Notes (Signed)
CSN: [id number]     Arrival date & time 03/16/15  [id number] History   First MD Initiated Contact with Patient 03/16/15 0830     No chief complaint on file.    (Consider location/radiation/quality/duration/timing/severity/associated sxs/prior Treatment) HPI  Pt presenting with c/o nausea, vomiting and diarrhea.  She states symptoms started acutely at 1am this morning.  She has had 5 episodes of vomiting- nonbloody and nonbilious and 5 episodes of watery diarrhea.  No focal abdominal pain but has had some cramping relieved after having diarrhea stool.  No fever/chills.  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  There are no other associated systemic symptoms, there are no other alleviating or modifying factors.  She has not had any treatment prior to arrival.    Past Medical History  Diagnosis Date  . Asthma    History reviewed. No pertinent past surgical history. No family history on file. Social History  Substance Use Topics  . Smoking status: Passive Smoke Exposure - Never Smoker  . Smokeless tobacco: Never Used  . Alcohol Use: No   OB History    No data available     Review of Systems  ROS reviewed and all otherwise negative except for mentioned in HPI    Allergies  [place]  Home Medications   Prior to Admission medications   Medication Sig Start Date End Date Taking? Authorizing Provider  ibuprofen (ADVIL,MOTRIN) 800 MG tablet Take 1 tablet (800 mg total) by mouth 3 (three) times daily. 02/20/15  Yes Arby BarretteMarcy Pfeiffer, MD  loratadine-pseudoephedrine (CLARITIN-D 12 HOUR) 5-120 MG tablet Take 1 tablet by mouth 2 (two) times daily. 02/20/15  Yes Arby BarretteMarcy Pfeiffer, MD  metroNIDAZOLE (FLAGYL) 500 MG tablet Take 1 tablet (500 mg total) by mouth 2 (two) times daily. 03/16/15   Jerelyn ScottMartha Linker, MD  ondansetron (ZOFRAN) 4 MG tablet Take 1 tablet (4 mg total) by mouth every 6 (six) hours. 03/16/15   Jerelyn ScottMartha Linker, MD  promethazine (PHENERGAN) 25 MG suppository Place 1  suppository (25 mg total) rectally every 6 (six) hours as needed for nausea or vomiting. 03/16/15   Jerelyn ScottMartha Linker, MD   BP 102/61 mmHg  Pulse 86  Temp(Src) 98.4 F (36.9 C) (Oral)  Resp 16  Ht 5\' 1"  (1.549 m)  Wt 87.998 kg  BMI 36.67 kg/m2  SpO2 99%  LMP 01/29/2015  Vitals reviewed Physical Exam  Physical Examination: General appearance - alert, well appearing, and in no distress Mental status - alert, oriented to person, place, and time Eyes - no conjunctival injection, no scleral icterus Mouth - mucous membranes moist, pharynx normal without lesions Chest - clear to auscultation, no wheezes, rales or rhonchi, symmetric air entry Heart - normal rate, regular rhythm, normal S1, S2, no murmurs, rubs, clicks or gallops Abdomen - soft, nontender, nondistended, no masses or organomegaly Neurological - alert, oriented,  Extremities - peripheral pulses normal, no pedal edema, no clubbing or cyanosis Skin - normal coloration and turgor, no rashes  ED Course  Procedures (including critical care time) Labs Review Labs Reviewed  URINALYSIS, ROUTINE W REFLEX MICROSCOPIC (NOT AT [facility]) - Abnormal; Notable for the following:    APPearance CLOUDY (*)    pH 8.5 (*)    Protein, ur 30 (*)    Leukocytes, UA SMALL (*)    All other components within normal limits  URINE MICROSCOPIC-ADD ON - Abnormal; Notable for the following:    Squamous Epithelial / LPF 0-5 (*)    Bacteria,  UA MANY (*)    All other components within normal limits  URINE CULTURE  PREGNANCY, URINE  GC/CHLAMYDIA PROBE AMP ([place]) NOT AT [facility]    Imaging Review No results found. I have personally reviewed and evaluated these images and lab results as part of my medical decision-making.   EKG Interpretation None      MDM   Final diagnoses:  Gastroenteritis  Trichomonas vaginalis infection    Pt presenting with c/o vomiting and diarrhea with acute onset this morning.  No significant abdominal pain and  abdominal exam is benign.  Pt feels much improved after zofran, she is able to drink po fluids.  Urine shows trichomonas- urine sent for gc/chlamydia as well.  Discharged with strict return precautions.  Pt agreeable with plan.    Jerelyn Scott, MD 03/17/15 [phone]

## 2015-03-16 NOTE — Discharge Instructions (Signed)
Return to the ED with any concerns including vomiting and not able to keep down liquids, abdominal pain especially if it localizes to the right lower abdomen, decreased level of alertness/lethargy, or any other alarming symptoms  The prescription for flagyl if for trichomonas- this is a sexually transmitted infection, we are testing for other infections as well.  The flagyl can cause nausea and vomiting, so you may want to take this medication after your nausea has resolved.  You should not have sexual contact until you are treated and all sexual partners are treated as well.

## 2015-03-16 NOTE — ED Notes (Signed)
Received call from the lab.  Not enough urine to run GC/CH.  Update to Dr. Karma GanjaLinker.  Call placed to patient to come back today and give additional urine sample.  Patient verbalized understanding and stated she will come back today.

## 2015-03-16 NOTE — ED Notes (Signed)
Pt given sprite to drink for po challenge.

## 2015-03-17 LAB — URINE CULTURE

## 2015-03-19 LAB — GC/CHLAMYDIA PROBE AMP (~~LOC~~) NOT AT ARMC
Chlamydia: NEGATIVE
Neisseria Gonorrhea: NEGATIVE

## 2015-04-01 ENCOUNTER — Encounter (HOSPITAL_BASED_OUTPATIENT_CLINIC_OR_DEPARTMENT_OTHER): Payer: Self-pay | Admitting: *Deleted

## 2015-04-01 ENCOUNTER — Emergency Department (HOSPITAL_BASED_OUTPATIENT_CLINIC_OR_DEPARTMENT_OTHER)
Admission: EM | Admit: 2015-04-01 | Discharge: 2015-04-01 | Disposition: A | Discharge status: Home / Self Care | Payer: Managed Care, Other (non HMO) | Attending: Emergency Medicine | Admitting: Emergency Medicine

## 2015-04-01 DIAGNOSIS — Z792 Long term (current) use of antibiotics: Secondary | ICD-10-CM | POA: Insufficient documentation

## 2015-04-01 DIAGNOSIS — M722 Plantar fascial fibromatosis: Secondary | ICD-10-CM | POA: Diagnosis not present

## 2015-04-01 DIAGNOSIS — Z791 Long term (current) use of non-steroidal anti-inflammatories (NSAID): Secondary | ICD-10-CM | POA: Diagnosis not present

## 2015-04-01 DIAGNOSIS — S29012A Strain of muscle and tendon of back wall of thorax, initial encounter: Secondary | ICD-10-CM | POA: Insufficient documentation

## 2015-04-01 DIAGNOSIS — S29002A Unspecified injury of muscle and tendon of back wall of thorax, initial encounter: Secondary | ICD-10-CM | POA: Diagnosis present

## 2015-04-01 DIAGNOSIS — S299XXA Unspecified injury of thorax, initial encounter: Secondary | ICD-10-CM | POA: Insufficient documentation

## 2015-04-01 DIAGNOSIS — J45909 Unspecified asthma, uncomplicated: Secondary | ICD-10-CM | POA: Insufficient documentation

## 2015-04-01 DIAGNOSIS — Y9289 Other specified places as the place of occurrence of the external cause: Secondary | ICD-10-CM | POA: Diagnosis not present

## 2015-04-01 DIAGNOSIS — X500XXA Overexertion from strenuous movement or load, initial encounter: Secondary | ICD-10-CM | POA: Insufficient documentation

## 2015-04-01 DIAGNOSIS — Y998 Other external cause status: Secondary | ICD-10-CM | POA: Diagnosis not present

## 2015-04-01 DIAGNOSIS — Z79899 Other long term (current) drug therapy: Secondary | ICD-10-CM | POA: Insufficient documentation

## 2015-04-01 DIAGNOSIS — Y93F2 Activity, caregiving, lifting: Secondary | ICD-10-CM | POA: Insufficient documentation

## 2015-04-01 MED ORDER — HYDROCODONE-ACETAMINOPHEN 5-325 MG PO TABS
1.0000 | ORAL_TABLET | Freq: Once | ORAL | Status: AC
Start: 2015-04-01 — End: 2015-04-01
  Administered 2015-04-01: 1 via ORAL
  Filled 2015-04-01: qty 1

## 2015-04-01 MED ORDER — CYCLOBENZAPRINE HCL 10 MG PO TABS: 10.0000 mg | ORAL_TABLET | Freq: Three times a day (TID) | ORAL | Status: DC | PRN

## 2015-04-01 MED ORDER — HYDROCODONE-ACETAMINOPHEN 5-325 MG PO TABS: 1.0000 | ORAL_TABLET | ORAL | Status: DC | PRN

## 2015-04-01 MED ORDER — CYCLOBENZAPRINE HCL 10 MG PO TABS
10.0000 mg | ORAL_TABLET | Freq: Once | ORAL | Status: AC
  Administered 2015-04-01: 10 mg via ORAL
  Filled 2015-04-01: qty 1

## 2015-04-01 NOTE — ED Notes (Signed)
Woke from w chest upper middle and also back  Pain increased w movement and palpation, feels like heart burn,  Denies n/v.  States when was asleep had sob

## 2015-04-01 NOTE — ED Provider Notes (Signed)
CSN: [id number]     Arrival date & time 04/01/15  [id number] History   First MD Initiated Contact with Patient 04/01/15 0510     Chief Complaint  Patient presents with  . 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 She is having moderate pain in her left upper back and less so in her right upper back. Pain is worse with movement or palpation. She is having lesser pain in her left upper chest. She is not presently short of breath but states she had shortness of breath while she was asleep earlier. She does have a history of asthma. She is also having pain in the arch of her left foot, worse with standing.  Past Medical History  Diagnosis Date  . Asthma    History reviewed. No pertinent past surgical history. No family history on file. Social History  Substance Use Topics  . Smoking status: Passive Smoke Exposure - Never Smoker  . Smokeless tobacco: Never Used  . Alcohol Use: No   OB History    No data available     Review of Systems  All other systems reviewed and are negative.   Allergies  [place]  Home Medications   Prior to Admission medications   Medication Sig Start Date End Date Taking? Authorizing Provider  ibuprofen (ADVIL,MOTRIN) 800 MG tablet Take 1 tablet (800 mg total) by mouth 3 (three) times daily. 02/20/15   Arby BarretteMarcy Pfeiffer, MD  loratadine-pseudoephedrine (CLARITIN-D 12 HOUR) 5-120 MG tablet Take 1 tablet by mouth 2 (two) times daily. 02/20/15   Arby BarretteMarcy Pfeiffer, MD  metroNIDAZOLE (FLAGYL) 500 MG tablet Take 1 tablet (500 mg total) by mouth 2 (two) times daily. 03/16/15   Jerelyn ScottMartha Linker, MD  ondansetron (ZOFRAN) 4 MG tablet Take 1 tablet (4 mg total) by mouth every 6 (six) hours. 03/16/15   Jerelyn ScottMartha Linker, MD  promethazine (PHENERGAN) 25 MG  suppository Place 1 suppository (25 mg total) rectally every 6 (six) hours as needed for nausea or vomiting. 03/16/15   Jerelyn ScottMartha Linker, MD   BP 109/66 mmHg  Pulse 72  Temp(Src) 98.2 F (36.8 C) (Oral)  Resp 20  Ht 5\' 1"  (1.549 m)  Wt 194 lb (87.998 kg)  BMI 36.67 kg/m2  SpO2 100%  LMP 01/29/2015   Physical Exam General: Well-developed, well-nourished female in no acute distress; appearance consistent with age of record HENT: normocephalic; atraumatic Eyes: pupils equal, round and reactive to light; extraocular muscles intact Neck: supple Heart: regular rate and rhythm Lungs: clear to auscultation bilaterally Chest: Mild left upper chest tenderness Back: Bilateral trapezoid tenderness, left greater than right; left trapezoid spasm palpated Abdomen: soft; nondistended Extremities: No deformity; full range of motion; pulses normal; tenderness of the arch of left foot Neurologic: Awake, alert and oriented; motor function intact in all extremities and symmetric; no facial droop Skin: Warm and dry Psychiatric: Normal mood and affect    ED Course  Procedures (including critical care time)   MDM    EKG Interpretation  Date/Time:  Friday April 01 2015 04:05:36 EST Ventricular Rate:  73 PR Interval:  137 QRS Duration: 87 QT Interval:  391 QTC Calculation: 431 R Axis:   94 Text Interpretation:  Sinus arrhythmia Borderline right axis deviation No significant change was found Confirmed by Jacquilyn Seldon  MD, Jonny RuizJOHN ([id number]) on  04/01/2015 5:11:32 AM      Examination consistent with muscle strain and plantar fasciitis. She was advised that arch supports for treatment of plantar fasciitis are available over-the-counter.      Paula Libra, MD 04/01/15 [phone]

## 2015-04-01 NOTE — ED Notes (Signed)
Pt now c/o rt shoulder pain

## 2015-04-01 NOTE — ED Notes (Signed)
C/o mid upper chest, back pain and left shoulder pain increased w movement and palpation onset 30 min pta,  Denies n/v,  States had sob while asleep

## 2015-04-30 ENCOUNTER — Emergency Department (HOSPITAL_BASED_OUTPATIENT_CLINIC_OR_DEPARTMENT_OTHER)
Admission: EM | Admit: 2015-04-30 | Discharge: 2015-04-30 | Disposition: A | Discharge status: Home / Self Care | Payer: No Typology Code available for payment source | Attending: Emergency Medicine | Admitting: Emergency Medicine

## 2015-04-30 ENCOUNTER — Encounter (HOSPITAL_BASED_OUTPATIENT_CLINIC_OR_DEPARTMENT_OTHER): Payer: Self-pay | Admitting: Emergency Medicine

## 2015-04-30 DIAGNOSIS — M549 Dorsalgia, unspecified: Secondary | ICD-10-CM

## 2015-04-30 DIAGNOSIS — J45909 Unspecified asthma, uncomplicated: Secondary | ICD-10-CM | POA: Diagnosis not present

## 2015-04-30 DIAGNOSIS — S29002A Unspecified injury of muscle and tendon of back wall of thorax, initial encounter: Secondary | ICD-10-CM | POA: Insufficient documentation

## 2015-04-30 DIAGNOSIS — Y998 Other external cause status: Secondary | ICD-10-CM | POA: Diagnosis not present

## 2015-04-30 DIAGNOSIS — Y9389 Activity, other specified: Secondary | ICD-10-CM | POA: Diagnosis not present

## 2015-04-30 DIAGNOSIS — Y92410 Unspecified street and highway as the place of occurrence of the external cause: Secondary | ICD-10-CM | POA: Insufficient documentation

## 2015-04-30 DIAGNOSIS — S199XXA Unspecified injury of neck, initial encounter: Secondary | ICD-10-CM | POA: Diagnosis not present

## 2015-04-30 DIAGNOSIS — Z791 Long term (current) use of non-steroidal anti-inflammatories (NSAID): Secondary | ICD-10-CM | POA: Diagnosis not present

## 2015-04-30 DIAGNOSIS — M542 Cervicalgia: Secondary | ICD-10-CM

## 2015-04-30 DIAGNOSIS — Z3202 Encounter for pregnancy test, result negative: Secondary | ICD-10-CM | POA: Insufficient documentation

## 2015-04-30 LAB — PREGNANCY, URINE: Preg Test, Ur: NEGATIVE

## 2015-04-30 MED ORDER — METHOCARBAMOL 500 MG PO TABS: 500.0000 mg | ORAL_TABLET | Freq: Two times a day (BID) | ORAL | Status: DC | PRN

## 2015-04-30 MED ORDER — NAPROXEN 250 MG PO TABS: 250.0000 mg | ORAL_TABLET | Freq: Two times a day (BID) | ORAL | Status: DC

## 2015-04-30 MED ORDER — ACETAMINOPHEN 325 MG PO TABS
650.0000 mg | ORAL_TABLET | Freq: Once | ORAL | Status: AC
  Administered 2015-04-30: 650 mg via ORAL
  Filled 2015-04-30: qty 2
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
## 2015-05-26 ENCOUNTER — Emergency Department (HOSPITAL_BASED_OUTPATIENT_CLINIC_OR_DEPARTMENT_OTHER)
Admission: EM | Admit: 2015-05-26 | Discharge: 2015-05-26 | Disposition: A | Discharge status: Home / Self Care | Payer: Self-pay | Attending: Emergency Medicine | Admitting: Emergency Medicine

## 2015-05-26 ENCOUNTER — Emergency Department (HOSPITAL_BASED_OUTPATIENT_CLINIC_OR_DEPARTMENT_OTHER): Payer: Self-pay

## 2015-05-26 ENCOUNTER — Encounter (HOSPITAL_BASED_OUTPATIENT_CLINIC_OR_DEPARTMENT_OTHER): Payer: Self-pay | Admitting: *Deleted

## 2015-05-26 DIAGNOSIS — Z791 Long term (current) use of non-steroidal anti-inflammatories (NSAID): Secondary | ICD-10-CM | POA: Insufficient documentation

## 2015-05-26 DIAGNOSIS — J45909 Unspecified asthma, uncomplicated: Secondary | ICD-10-CM | POA: Insufficient documentation

## 2015-05-26 DIAGNOSIS — R079 Chest pain, unspecified: Secondary | ICD-10-CM | POA: Insufficient documentation

## 2015-05-26 DIAGNOSIS — Z79899 Other long term (current) drug therapy: Secondary | ICD-10-CM | POA: Insufficient documentation

## 2015-05-26 MED ORDER — OMEPRAZOLE 20 MG PO CPDR: 20.0000 mg | DELAYED_RELEASE_CAPSULE | Freq: Every day | ORAL | Status: DC

## 2015-05-26 MED ORDER — ALBUTEROL SULFATE HFA 108 (90 BASE) MCG/ACT IN AERS: 1.0000 | INHALATION_SPRAY | Freq: Four times a day (QID) | RESPIRATORY_TRACT | Status: DC | PRN

## 2015-05-26 NOTE — ED Notes (Signed)
Weak dizzy tightness over her body and chest pain while at work today.

## 2015-05-26 NOTE — ED Provider Notes (Signed)
CSN: [id number]     Arrival date & time 05/26/15  1350 History   First MD Initiated Contact with Patient 05/26/15 1446     Chief Complaint  Patient presents with  . Chest Pain     (Consider location/radiation/quality/duration/timing/severity/associated sxs/prior Treatment) HPI Comments: Patient presents today with a chief complaint of chest pain.  She states that the she began having pain earlier this afternoon after eating chicken nuggets.  She states that the pain lasted approximately 1.5 hours and has now completely resolved at this time.  She did not take any medications for her symptoms.  She states that the pain was a burning pain and "felt like her acid reflux."   She denies any fever, chills, cough, hemoptysis, SOB, nausea, vomiting, or LE edema.  No history of PE or DVT.  No prolonged surgeries or prolonged travel in the past 4 weeks.  Denies any cardiac history.  No family history of cardiac disease aside from CHF.  No family history of death of unknown cause at a young age.   Patient is a 20 y.o. female presenting with chest pain.  Chest Pain   Past Medical History  Diagnosis Date  . Asthma    History reviewed. No pertinent past surgical history. No family history on file. Social History  Substance Use Topics  . Smoking status: Passive Smoke Exposure - Never Smoker  . Smokeless tobacco: Never Used  . Alcohol Use: No   OB History    No data available     Review of Systems  Cardiovascular: Positive for chest pain.  All other systems reviewed and are negative.     Allergies  Peach  Home Medications   Prior to Admission medications   Medication Sig Start Date End Date Taking? Authorizing Provider  cyclobenzaprine (FLEXERIL) 10 MG tablet Take 1 tablet (10 mg total) by mouth 3 (three) times daily as needed for muscle spasms. 04/01/15   John Molpus, MD  HYDROcodone-acetaminophen (NORCO) 5-325 MG tablet Take 1-2 tablets by mouth every 4 (four) hours as needed (for  pain). 04/01/15   John Molpus, MD  ibuprofen (ADVIL,MOTRIN) 800 MG tablet Take 1 tablet (800 mg total) by mouth 3 (three) times daily. 02/20/15   Arby Barrette, MD  loratadine-pseudoephedrine (CLARITIN-D 12 HOUR) 5-120 MG tablet Take 1 tablet by mouth 2 (two) times daily. 02/20/15   Arby Barrette, MD  methocarbamol (ROBAXIN) 500 MG tablet Take 1 tablet (500 mg total) by mouth 2 (two) times daily as needed for muscle spasms. 04/30/15   Everlene Farrier, PA-C  naproxen (NAPROSYN) 250 MG tablet Take 1 tablet (250 mg total) by mouth 2 (two) times daily with a meal. 04/30/15   Everlene Farrier, PA-C  ondansetron (ZOFRAN) 4 MG tablet Take 1 tablet (4 mg total) by mouth every 6 (six) hours. 03/16/15   Jerelyn Scott, MD  promethazine (PHENERGAN) 25 MG suppository Place 1 suppository (25 mg total) rectally every 6 (six) hours as needed for nausea or vomiting. 03/16/15   Jerelyn Scott, MD   BP 119/72 mmHg  Pulse 94  Temp(Src) 98.1 F (36.7 C) (Oral)  Resp 20  Ht  (1.549 m)  Wt 86.183 kg  BMI 35.92 kg/m2  SpO2 99%  LMP 03/31/2015 Physical Exam  Constitutional: She appears well-developed and well-nourished. No distress.  HENT:  Head: Normocephalic and atraumatic.  Neck: Normal range of motion. Neck supple.  Cardiovascular: Normal rate, regular rhythm, normal heart sounds and intact distal pulses.   Pulmonary/Chest: Effort normal and  breath sounds normal. No respiratory distress. She has no wheezes. She has no rales. She exhibits no tenderness.  Abdominal: Soft. There is no tenderness.  Musculoskeletal: Normal range of motion.  No LE edema or erythema  Neurological: She is alert.  Skin: Skin is warm and dry. She is not diaphoretic.  Psychiatric: She has a normal mood and affect.  Nursing note and vitals reviewed.   ED Course  Procedures (including critical care time) Labs Review Labs Reviewed - No data to display  Imaging Review Dg Chest 2 View  05/26/2015  CLINICAL DATA:  Chest tightness  for 2 days,nonsmoker EXAM: CHEST  2 VIEW COMPARISON:  06/26/2014 FINDINGS: The heart size and mediastinal contours are within normal limits. Both lungs are clear. The visualized skeletal structures are unremarkable. IMPRESSION: No active cardiopulmonary disease. Electronically Signed   By: Norva PavlovElizabeth  Brown M.D.   On: 05/26/2015 15:14   I have personally reviewed and evaluated these images and lab results as part of my medical decision-making.   EKG Interpretation None      MDM   Final diagnoses:  Chest pain   Patient presents today with a chief complaint of chest pain.  She reports onset of pain after eating chicken nuggets and stated that the pain resolved without intervention.  No chest pain by the time of my evaluation in the ED.  VSS. No hypoxia.  No ischemic changes on EKG.  CXR is negative.  Symptoms most consistent with GERD.  Patient stable for discharge.  Return precautions given.      Santiago GladHeather Demetreus Lothamer, PA-C 05/26/15 2218  Vanetta MuldersScott Zackowski, MD 05/27/15 [phone]

## 2015-05-26 NOTE — ED Provider Notes (Signed)
Medical screening examination/treatment/procedure(s) were performed by non-physician practitioner and as supervising physician I was immediately available for consultation/collaboration.   EKG Interpretation None      ED ECG REPORT   Date: 05/26/2015  Rate: 102  Rhythm: normal sinus rhythm and sinus tachycardia  QRS Axis: normal  Intervals: normal  ST/T Wave abnormalities: normal  Conduction Disutrbances:none  Narrative Interpretation:   Old EKG Reviewed: none available  I have personally reviewed the EKG tracing and agree with the computerized printout as noted. \  Vanetta MuldersScott Hershell Brandl, MD 05/27/15 [phone]

## 2015-05-31 ENCOUNTER — Emergency Department (HOSPITAL_BASED_OUTPATIENT_CLINIC_OR_DEPARTMENT_OTHER)
Admission: EM | Admit: 2015-05-31 | Discharge: 2015-05-31 | Disposition: A | Discharge status: Home / Self Care | Payer: Managed Care, Other (non HMO) | Attending: Emergency Medicine | Admitting: Emergency Medicine

## 2015-05-31 ENCOUNTER — Encounter (HOSPITAL_BASED_OUTPATIENT_CLINIC_OR_DEPARTMENT_OTHER): Payer: Self-pay | Admitting: Emergency Medicine

## 2015-05-31 DIAGNOSIS — Z79899 Other long term (current) drug therapy: Secondary | ICD-10-CM | POA: Insufficient documentation

## 2015-05-31 DIAGNOSIS — Z791 Long term (current) use of non-steroidal anti-inflammatories (NSAID): Secondary | ICD-10-CM | POA: Insufficient documentation

## 2015-05-31 DIAGNOSIS — J45909 Unspecified asthma, uncomplicated: Secondary | ICD-10-CM | POA: Insufficient documentation

## 2015-05-31 DIAGNOSIS — J029 Acute pharyngitis, unspecified: Secondary | ICD-10-CM

## 2015-05-31 NOTE — Discharge Instructions (Signed)

## 2015-05-31 NOTE — ED Notes (Signed)
Pt states sore throat for 1 hour

## 2015-05-31 NOTE — ED Notes (Signed)
Pt verbalizes understanding of d/c instructions and denies any further needs at this time. 

## 2015-05-31 NOTE — ED Provider Notes (Signed)
CSN: [id number]     Arrival date & time 05/31/15  0217 History   First MD Initiated Contact with Patient 05/31/15 [phone]     Chief Complaint  Patient presents with  . Sore Throat     (Consider location/radiation/quality/duration/timing/severity/associated sxs/prior Treatment) HPI  This is a 20 year old female who developed a sore throat about 1 hour prior to arrival. The symptoms are mild and described as a burning sensation worse with swallowing. She is here to make sure she doesn't have strep throat. She denies fever, chills, nasal congestion, cough, nausea, vomiting and diarrhea.  Past Medical History  Diagnosis Date  . Asthma    History reviewed. No pertinent past surgical history. History reviewed. No pertinent family history. Social History  Substance Use Topics  . Smoking status: Passive Smoke Exposure - Never Smoker  . Smokeless tobacco: Never Used  . Alcohol Use: No   OB History    No data available     Review of Systems  All other systems reviewed and are negative.   Allergies  Peach  Home Medications   Prior to Admission medications   Medication Sig Start Date End Date Taking? Authorizing Provider  albuterol (PROVENTIL HFA;VENTOLIN HFA) 108 (90 Base) MCG/ACT inhaler Inhale 1-2 puffs into the lungs every 6 (six) hours as needed for wheezing or shortness of breath. 05/26/15   Heather Laisure, PA-C  cyclobenzaprine (FLEXERIL) 10 MG tablet Take 1 tablet (10 mg total) by mouth 3 (three) times daily as needed for muscle spasms. 04/01/15   Lonzell Dorris, MD  HYDROcodone-acetaminophen (NORCO) 5-325 MG tablet Take 1-2 tablets by mouth every 4 (four) hours as needed (for pain). 04/01/15   Jarrette Dehner, MD  ibuprofen (ADVIL,MOTRIN) 800 MG tablet Take 1 tablet (800 mg total) by mouth 3 (three) times daily. 02/20/15   Arby BarretteMarcy Pfeiffer, MD  loratadine-pseudoephedrine (CLARITIN-D 12 HOUR) 5-120 MG tablet Take 1 tablet by mouth 2 (two) times daily. 02/20/15   Arby BarretteMarcy Pfeiffer, MD   methocarbamol (ROBAXIN) 500 MG tablet Take 1 tablet (500 mg total) by mouth 2 (two) times daily as needed for muscle spasms. 04/30/15   Everlene FarrierWilliam Dansie, PA-C  naproxen (NAPROSYN) 250 MG tablet Take 1 tablet (250 mg total) by mouth 2 (two) times daily with a meal. 04/30/15   Everlene FarrierWilliam Dansie, PA-C  omeprazole (PRILOSEC) 20 MG capsule Take 1 capsule (20 mg total) by mouth daily. 05/26/15   Heather Laisure, PA-C  ondansetron (ZOFRAN) 4 MG tablet Take 1 tablet (4 mg total) by mouth every 6 (six) hours. 03/16/15   Jerelyn ScottMartha Linker, MD  promethazine (PHENERGAN) 25 MG suppository Place 1 suppository (25 mg total) rectally every 6 (six) hours as needed for nausea or vomiting. 03/16/15   Jerelyn ScottMartha Linker, MD   BP 116/76 mmHg  Pulse 95  Temp(Src) 98.4 F (36.9 C) (Oral)  Resp 18  Ht 5\' 2"  (1.575 m)  Wt 194 lb (87.998 kg)  BMI 35.47 kg/m2  SpO2 100%  LMP 04/30/2015   Physical Exam  General: Well-developed, well-nourished female in no acute distress; appearance consistent with age of record HENT: normocephalic; atraumatic; pharynx normal Eyes: pupils equal, round and reactive to light; extraocular muscles intact Neck: supple; no lymphadenopathy Heart: regular rate and rhythm Lungs: clear to auscultation bilaterally Abdomen: soft; nondistended; nontender; bowel sounds present Extremities: No deformity; full range of motion; pulses normal Neurologic: Awake, alert and oriented; motor function intact in all extremities and symmetric; no facial droop Skin: Warm and dry Psychiatric: Normal mood and affect  ED Course  Procedures (including critical care time)   MDM  4:43 AM The patient insists on leaving because she has to be at work by 5 AM. Throat swab has been sent for strep culture.    Paula Libra, MD 05/31/15 [phone]

## 2015-06-02 LAB — CULTURE, GROUP A STREP (THRC)

## 2015-07-03 ENCOUNTER — Encounter (HOSPITAL_BASED_OUTPATIENT_CLINIC_OR_DEPARTMENT_OTHER): Payer: Self-pay | Admitting: Emergency Medicine

## 2015-07-03 ENCOUNTER — Emergency Department (HOSPITAL_BASED_OUTPATIENT_CLINIC_OR_DEPARTMENT_OTHER)
Admission: EM | Admit: 2015-07-03 | Discharge: 2015-07-03 | Disposition: A | Discharge status: Home / Self Care | Payer: Managed Care, Other (non HMO) | Attending: Emergency Medicine | Admitting: Emergency Medicine

## 2015-07-03 DIAGNOSIS — Z79899 Other long term (current) drug therapy: Secondary | ICD-10-CM | POA: Insufficient documentation

## 2015-07-03 DIAGNOSIS — K219 Gastro-esophageal reflux disease without esophagitis: Secondary | ICD-10-CM

## 2015-07-03 DIAGNOSIS — J45909 Unspecified asthma, uncomplicated: Secondary | ICD-10-CM | POA: Insufficient documentation

## 2015-07-03 DIAGNOSIS — Z791 Long term (current) use of non-steroidal anti-inflammatories (NSAID): Secondary | ICD-10-CM | POA: Insufficient documentation

## 2015-07-03 DIAGNOSIS — Z760 Encounter for issue of repeat prescription: Secondary | ICD-10-CM | POA: Insufficient documentation

## 2015-07-03 MED ORDER — FAMOTIDINE 20 MG PO TABS: 20.0000 mg | ORAL_TABLET | Freq: Two times a day (BID) | ORAL | Status: DC

## 2015-07-03 MED ORDER — GI COCKTAIL ~~LOC~~
30.0000 mL | Freq: Once | ORAL | Status: AC
  Administered 2015-07-03: 30 mL via ORAL
  Filled 2015-07-03: qty 30

## 2015-07-03 MED ORDER — ALBUTEROL SULFATE HFA 108 (90 BASE) MCG/ACT IN AERS: 1.0000 | INHALATION_SPRAY | Freq: Four times a day (QID) | RESPIRATORY_TRACT | Status: DC | PRN

## 2015-07-03 NOTE — ED Provider Notes (Signed)
CSN: [id number]     Arrival date & time 07/03/15  0432 History   First MD Initiated Contact with Patient 07/03/15 [phone]     Chief Complaint  Patient presents with  . Cough     (Consider location/radiation/quality/duration/timing/severity/associated sxs/prior Treatment) Patient is a 20 y.o. female presenting with cough. The history is provided by the patient.  Cough Cough characteristics:  Non-productive Severity:  Mild Onset quality:  Sudden Duration:  2 hours Timing:  Rare Progression:  Unchanged Chronicity:  Recurrent Smoker: no   Context: not animal exposure   Relieved by:  Nothing Worsened by:  Nothing tried Ineffective treatments:  None tried Associated symptoms: no chest pain, no fever, no shortness of breath and no wheezing   Risk factors: no recent travel     Past Medical History  Diagnosis Date  . Asthma    History reviewed. No pertinent past surgical history. History reviewed. No pertinent family history. Social History  Substance Use Topics  . Smoking status: Passive Smoke Exposure - Never Smoker  . Smokeless tobacco: Never Used  . Alcohol Use: No   OB History    No data available     Review of Systems  Constitutional: Negative for fever.  Respiratory: Positive for cough. Negative for choking, chest tightness, shortness of breath and wheezing.   Cardiovascular: Negative for chest pain, palpitations and leg swelling.  All other systems reviewed and are negative.     Allergies  [place]  Home Medications   Prior to Admission medications   Medication Sig Start Date End Date Taking? Authorizing Provider  albuterol (PROVENTIL HFA;VENTOLIN HFA) 108 (90 Base) MCG/ACT inhaler Inhale 1-2 puffs into the lungs every 6 (six) hours as needed for wheezing or shortness of breath. 05/26/15   Heather Laisure, PA-C  albuterol (PROVENTIL HFA;VENTOLIN HFA) 108 (90 Base) MCG/ACT inhaler Inhale 1-2 puffs into the lungs every 6 (six) hours as needed for wheezing or  shortness of breath. 07/03/15   Jadarius Commons, MD  cyclobenzaprine (FLEXERIL) 10 MG tablet Take 1 tablet (10 mg total) by mouth 3 (three) times daily as needed for muscle spasms. 04/01/15   John Molpus, MD  famotidine (PEPCID) 20 MG tablet Take 1 tablet (20 mg total) by mouth 2 (two) times daily. 07/03/15   Abdirizak Richison, MD  HYDROcodone-acetaminophen (NORCO) 5-325 MG tablet Take 1-2 tablets by mouth every 4 (four) hours as needed (for pain). 04/01/15   John Molpus, MD  ibuprofen (ADVIL,MOTRIN) 800 MG tablet Take 1 tablet (800 mg total) by mouth 3 (three) times daily. 02/20/15   Arby BarretteMarcy Pfeiffer, MD  loratadine-pseudoephedrine (CLARITIN-D 12 HOUR) 5-120 MG tablet Take 1 tablet by mouth 2 (two) times daily. 02/20/15   Arby BarretteMarcy Pfeiffer, MD  methocarbamol (ROBAXIN) 500 MG tablet Take 1 tablet (500 mg total) by mouth 2 (two) times daily as needed for muscle spasms. 04/30/15   Everlene FarrierWilliam Dansie, PA-C  naproxen (NAPROSYN) 250 MG tablet Take 1 tablet (250 mg total) by mouth 2 (two) times daily with a meal. 04/30/15   Everlene FarrierWilliam Dansie, PA-C  omeprazole (PRILOSEC) 20 MG capsule Take 1 capsule (20 mg total) by mouth daily. 05/26/15   Heather Laisure, PA-C  ondansetron (ZOFRAN) 4 MG tablet Take 1 tablet (4 mg total) by mouth every 6 (six) hours. 03/16/15   Jerelyn ScottMartha Linker, MD  promethazine (PHENERGAN) 25 MG suppository Place 1 suppository (25 mg total) rectally every 6 (six) hours as needed for nausea or vomiting. 03/16/15   Jerelyn ScottMartha Linker, MD   BP 111/84 mmHg  Pulse 76  Temp(Src) 98 F (36.7 C) (Oral)  Resp 17  Ht  (1.575 m)  Wt 194 lb (87.998 kg)  BMI 35.47 kg/m2  SpO2 99%  LMP 06/08/2015 Physical Exam  Constitutional: She is oriented to person, place, and time. She appears well-developed and well-nourished. No distress.  HENT:  Head: Normocephalic and atraumatic.  Mouth/Throat: Oropharynx is clear and moist.  Eyes: Conjunctivae are normal. Pupils are equal, round, and reactive to light.  Neck: Normal range of  motion. Neck supple.  Cardiovascular: Normal rate, regular rhythm and intact distal pulses.   Pulmonary/Chest: Effort normal and breath sounds normal. No respiratory distress. She has no wheezes. She has no rales. She exhibits no tenderness.  Abdominal: Soft. There is no tenderness. There is no rebound and no guarding.  Gassy with hyperactive bowel sounds heard into the thoracic cavity  Musculoskeletal: Normal range of motion.  Neurological: She is alert and oriented to person, place, and time.  Skin: Skin is warm and dry.  Psychiatric: She has a normal mood and affect.    ED Course  Procedures (including critical care time) Labs Review Labs Reviewed - No data to display  Imaging Review No results found. I have personally reviewed and evaluated these images and lab results as part of my medical decision-making.   EKG Interpretation None      MDM   Final diagnoses:  Gastroesophageal reflux disease without esophagitis  Encounter for medication refill   Filed Vitals:   07/03/15 0518 07/03/15 0625  BP:  111/84  Pulse: 84 76  Temp:  98 F (36.7 C)  Resp: 14 17   Medications  gi cocktail (Maalox,Lidocaine,Donnatal) (30 mLs Oral Given 07/03/15 0615)    Exam and time course consistent with GERD.  Neither RT nor I heard wheezes and there has been no coughing in the ED.  There is no indication for imaging and no signs of infections.  Patient states she lost her inhaler.  We will wrist for pepcid and a gerd friendly diet without nothing to eat or drink several hours before bed and will refill inhaler.      Cy Blamer, MD 07/03/15 [phone]

## 2015-07-03 NOTE — ED Notes (Signed)
Pt states personal hx of asthma. States she "feels like her asthma is acting up". Reports coughing, shortness of breath and chest tightness. Denies fever, recent illness or other symptoms.

## 2015-07-10 ENCOUNTER — Encounter (HOSPITAL_BASED_OUTPATIENT_CLINIC_OR_DEPARTMENT_OTHER): Payer: Self-pay | Admitting: *Deleted

## 2015-07-10 ENCOUNTER — Emergency Department (HOSPITAL_BASED_OUTPATIENT_CLINIC_OR_DEPARTMENT_OTHER)
Admission: EM | Admit: 2015-07-10 | Discharge: 2015-07-10 | Disposition: A | Discharge status: Home / Self Care | Payer: Managed Care, Other (non HMO) | Attending: Emergency Medicine | Admitting: Emergency Medicine

## 2015-07-10 DIAGNOSIS — Z711 Person with feared health complaint in whom no diagnosis is made: Secondary | ICD-10-CM

## 2015-07-10 DIAGNOSIS — Z202 Contact with and (suspected) exposure to infections with a predominantly sexual mode of transmission: Secondary | ICD-10-CM | POA: Insufficient documentation

## 2015-07-10 DIAGNOSIS — J45909 Unspecified asthma, uncomplicated: Secondary | ICD-10-CM | POA: Insufficient documentation

## 2015-07-10 DIAGNOSIS — K5900 Constipation, unspecified: Secondary | ICD-10-CM | POA: Insufficient documentation

## 2015-07-10 DIAGNOSIS — K2100 Gastro-esophageal reflux disease with esophagitis, without bleeding: Secondary | ICD-10-CM

## 2015-07-10 DIAGNOSIS — A599 Trichomoniasis, unspecified: Secondary | ICD-10-CM | POA: Insufficient documentation

## 2015-07-10 DIAGNOSIS — Z791 Long term (current) use of non-steroidal anti-inflammatories (NSAID): Secondary | ICD-10-CM | POA: Insufficient documentation

## 2015-07-10 DIAGNOSIS — Z79899 Other long term (current) drug therapy: Secondary | ICD-10-CM | POA: Insufficient documentation

## 2015-07-10 HISTORY — DX: Reserved for inherently not codable concepts without codable children: IMO0001

## 2015-07-10 HISTORY — DX: Gastro-esophageal reflux disease without esophagitis: K21.9

## 2015-07-10 LAB — URINE MICROSCOPIC-ADD ON: RBC / HPF: NONE SEEN RBC/hpf (ref 0–5)

## 2015-07-10 LAB — WET PREP, GENITAL
Sperm: NONE SEEN
Yeast Wet Prep HPF POC: NONE SEEN

## 2015-07-10 LAB — PREGNANCY, URINE: Preg Test, Ur: NEGATIVE

## 2015-07-10 LAB — URINALYSIS, ROUTINE W REFLEX MICROSCOPIC
Bilirubin Urine: NEGATIVE
Glucose, UA: NEGATIVE mg/dL
Hgb urine dipstick: NEGATIVE
Ketones, ur: NEGATIVE mg/dL
Nitrite: NEGATIVE
Protein, ur: NEGATIVE mg/dL
Specific Gravity, Urine: 1.021 (ref 1.005–1.030)
pH: 6.5 (ref 5.0–8.0)

## 2015-07-10 MED ORDER — CEFTRIAXONE SODIUM 250 MG IJ SOLR
250.0000 mg | Freq: Once | INTRAMUSCULAR | Status: AC
  Administered 2015-07-10: 250 mg via INTRAMUSCULAR
  Filled 2015-07-10: qty 250

## 2015-07-10 MED ORDER — OMEPRAZOLE 20 MG PO CPDR: 20.0000 mg | DELAYED_RELEASE_CAPSULE | Freq: Every day | ORAL | Status: DC

## 2015-07-10 MED ORDER — AZITHROMYCIN 250 MG PO TABS
1000.0000 mg | ORAL_TABLET | Freq: Once | ORAL | Status: AC
  Administered 2015-07-10: 1000 mg via ORAL
  Filled 2015-07-10: qty 4

## 2015-07-10 MED ORDER — GI COCKTAIL ~~LOC~~
30.0000 mL | Freq: Once | ORAL | Status: AC
  Administered 2015-07-10: 30 mL via ORAL
  Filled 2015-07-10: qty 30

## 2015-07-10 MED ORDER — POLYETHYLENE GLYCOL 3350 17 GM/SCOOP PO POWD
17.0000 g | Freq: Two times a day (BID) | ORAL | Status: DC
Start: 2015-07-10 — End: 2015-08-15

## 2015-07-10 MED ORDER — METRONIDAZOLE 500 MG PO TABS: 500.0000 mg | ORAL_TABLET | Freq: Two times a day (BID) | ORAL | Status: DC

## 2015-07-10 MED ORDER — LIDOCAINE HCL (PF) 1 % IJ SOLN
INTRAMUSCULAR | Status: AC
  Administered 2015-07-10: 5 mL
  Filled 2015-07-10: qty 5

## 2015-07-10 NOTE — ED Notes (Signed)
States having heartburn as well, having a lot of belching.

## 2015-07-10 NOTE — ED Notes (Signed)
Patient c/o abd pain and heartburn that started this morning.  Took pepto bismol two days ago for same

## 2015-07-10 NOTE — ED Notes (Signed)
Pt tolerated IM med well, no S/S of reaction noted.

## 2015-07-10 NOTE — ED Notes (Signed)
Recently had a vaginal infection, has completed treatment, states she feels as if infection is still present. Denies any vaginal drainage, states urine is cloudy as well. Denies any N/V/D, appetite is poor.

## 2015-07-10 NOTE — ED Provider Notes (Signed)
CSN: [id number]     Arrival date & time 07/10/15  0946 History   First MD Initiated Contact with Patient 07/10/15 [phone]     Chief Complaint  Patient presents with  . Abdominal Pain    Brianna Byrd is a 20 y.o. female who presents to the ED complaining of symptoms of acid reflux recently and intermittent bilateral lower abdominal pain with constipation. The patient reports she has been having acid reflux symptoms recently with lots of burping, belching and bad taste in her mouth. She was Prescribed omeprazole but has only been taking this intermittently. She reports she last took 1 tablet 3 days ago. Patient also reports she's been having some bilateral lower abdominal pain when she sits on the toilet and feels that she is constipated. She reports her last bowel movement was yesterday and had a hard stool. She reports she's been having to strain to have a bowel movement and has been constipated. She has been passing gas. He describes the abdominal pain as a sensation of needing to sit on the toilet and have a bowel movement. She has taken nothing for treatment of her constipation. The patient denies fevers, shortness of breath, coughing, nausea, vomiting, diarrhea, rashes, urinary symptoms, hematuria, vaginal bleeding, vaginal discharge or vaginal discomfort.  The history is provided by the patient. No language interpreter was used.    Past Medical History  Diagnosis Date  . Asthma   . Reflux    History reviewed. No pertinent past surgical history. No family history on file. Social History  Substance Use Topics  . Smoking status: Passive Smoke Exposure - Never Smoker  . Smokeless tobacco: Never Used  . Alcohol Use: No   OB History    No data available     Review of Systems  Constitutional: Negative for fever and chills.  HENT: Negative for congestion and sore throat.   Eyes: Negative for visual disturbance.  Respiratory: Negative for cough, shortness of breath and wheezing.    Cardiovascular: Negative for chest pain.  Gastrointestinal: Positive for abdominal pain and constipation. Negative for nausea, vomiting, diarrhea, blood in stool and rectal pain.  Genitourinary: Negative for dysuria, urgency, frequency, hematuria, flank pain, decreased urine volume, vaginal bleeding, vaginal discharge and difficulty urinating.  Musculoskeletal: Negative for back pain and neck pain.  Skin: Negative for rash.  Neurological: Negative for headaches.      Allergies  [place]  Home Medications   Prior to Admission medications   Medication Sig Start Date End Date Taking? Authorizing Provider  albuterol (PROVENTIL HFA;VENTOLIN HFA) 108 (90 Base) MCG/ACT inhaler Inhale 1-2 puffs into the lungs every 6 (six) hours as needed for wheezing or shortness of breath. 05/26/15   Heather Laisure, PA-C  albuterol (PROVENTIL HFA;VENTOLIN HFA) 108 (90 Base) MCG/ACT inhaler Inhale 1-2 puffs into the lungs every 6 (six) hours as needed for wheezing or shortness of breath. 07/03/15   April Palumbo, MD  cyclobenzaprine (FLEXERIL) 10 MG tablet Take 1 tablet (10 mg total) by mouth 3 (three) times daily as needed for muscle spasms. 04/01/15   John Molpus, MD  famotidine (PEPCID) 20 MG tablet Take 1 tablet (20 mg total) by mouth 2 (two) times daily. 07/03/15   April Palumbo, MD  HYDROcodone-acetaminophen (NORCO) 5-325 MG tablet Take 1-2 tablets by mouth every 4 (four) hours as needed (for pain). 04/01/15   John Molpus, MD  ibuprofen (ADVIL,MOTRIN) 800 MG tablet Take 1 tablet (800 mg total) by mouth 3 (three) times daily. 02/20/15  Arby BarretteMarcy Pfeiffer, MD  loratadine-pseudoephedrine (CLARITIN-D 12 HOUR) 5-120 MG tablet Take 1 tablet by mouth 2 (two) times daily. 02/20/15   Arby BarretteMarcy Pfeiffer, MD  methocarbamol (ROBAXIN) 500 MG tablet Take 1 tablet (500 mg total) by mouth 2 (two) times daily as needed for muscle spasms. 04/30/15   Everlene FarrierWilliam Ellijah Leffel, PA-C  metroNIDAZOLE (FLAGYL) 500 MG tablet Take 1 tablet (500 mg total) by  mouth 2 (two) times daily. 07/10/15   Everlene FarrierWilliam Joylene Wescott, PA-C  naproxen (NAPROSYN) 250 MG tablet Take 1 tablet (250 mg total) by mouth 2 (two) times daily with a meal. 04/30/15   Everlene FarrierWilliam Kamaal Cast, PA-C  omeprazole (PRILOSEC) 20 MG capsule Take 1 capsule (20 mg total) by mouth daily. 07/10/15   Everlene FarrierWilliam Ahman Dugdale, PA-C  ondansetron (ZOFRAN) 4 MG tablet Take 1 tablet (4 mg total) by mouth every 6 (six) hours. 03/16/15   Jerelyn ScottMartha Linker, MD  polyethylene glycol powder (GLYCOLAX/MIRALAX) powder Take 17 g by mouth 2 (two) times daily. Until daily soft stools  OTC 07/10/15   Everlene FarrierWilliam Teneil Shiller, PA-C  promethazine (PHENERGAN) 25 MG suppository Place 1 suppository (25 mg total) rectally every 6 (six) hours as needed for nausea or vomiting. 03/16/15   Jerelyn ScottMartha Linker, MD   BP 102/73 mmHg  Pulse 68  Temp(Src) 98.4 F (36.9 C) (Oral)  Resp 20  Ht 5\' 2"  (1.575 m)  Wt 87.998 kg  BMI 35.47 kg/m2  SpO2 100%  LMP 06/08/2015 Physical Exam  Constitutional: She appears well-developed and well-nourished. No distress.  Nontoxic appearing.  HENT:  Head: Normocephalic and atraumatic.  Mouth/Throat: Oropharynx is clear and moist.  Throat is clear.  Eyes: Conjunctivae are normal. Pupils are equal, round, and reactive to light. Right eye exhibits no discharge. Left eye exhibits no discharge.  Neck: Neck supple.  Cardiovascular: Normal rate, regular rhythm, normal heart sounds and intact distal pulses.  Exam reveals no gallop and no friction rub.   No murmur heard. Pulmonary/Chest: Effort normal and breath sounds normal. No respiratory distress. She has no wheezes. She has no rales.  Lungs clear to auscultation bilaterally.  Abdominal: Soft. Bowel sounds are normal. She exhibits no distension. There is no tenderness. There is no guarding.  Abdomen is soft and nontender to palpation. Bowel sounds are present. No peritoneal signs. No psoas sign.  Genitourinary: Vaginal discharge found.  Pelvic exam performed by me with female RN  chaperone. No external lesions or rashes noted. Mild clear vaginal discharge noted. No vaginal bleeding. Cervix is closed. No cervical motion tenderness. No adnexal tenderness or fullness.  Musculoskeletal: She exhibits no edema.  Lymphadenopathy:    She has no cervical adenopathy.  Neurological: She is alert. Coordination normal.  Skin: Skin is warm and dry. No rash noted. She is not diaphoretic. No erythema. No pallor.  Psychiatric: She has a normal mood and affect. Her behavior is normal.  Nursing note and vitals reviewed.   ED Course  Procedures (including critical care time) Labs Review Labs Reviewed  WET PREP, GENITAL - Abnormal; Notable for the following:    Trich, Wet Prep PRESENT (*)    Clue Cells Wet Prep HPF POC PRESENT (*)    WBC, Wet Prep HPF POC MODERATE (*)    All other components within normal limits  URINALYSIS, ROUTINE W REFLEX MICROSCOPIC (NOT [facility]) - Abnormal; Notable for the following:    APPearance CLOUDY (*)    Leukocytes, UA SMALL (*)    All other components within normal limits  URINE MICROSCOPIC-ADD ON - Abnormal;  Notable for the following:    Squamous Epithelial / LPF 6-30 (*)    Bacteria, UA MANY (*)    All other components within normal limits  URINE CULTURE  PREGNANCY, URINE  GC/CHLAMYDIA PROBE AMP ([place]) NOT AT [facility]  GC/CHLAMYDIA PROBE AMP ([place]) NOT AT [facility]    Imaging Review No results found. I have personally reviewed and evaluated these lab results as part of my medical decision-making.   EKG Interpretation None     Filed Vitals:   07/10/15 0952  BP: 102/73  Pulse: 68  Temp: 98.4 F (36.9 C)  TempSrc: Oral  Resp: 20  Height:  (1.575 m)  Weight: 87.998 kg  SpO2: 100%    MDM   Meds given in ED:  Medications  cefTRIAXone (ROCEPHIN) injection 250 mg (not administered)  azithromycin (ZITHROMAX) tablet 1,000 mg (not administered)  gi cocktail (Maalox,Lidocaine,Donnatal) (30 mLs Oral Given 07/10/15 1038)     New Prescriptions   METRONIDAZOLE (FLAGYL) 500 MG TABLET    Take 1 tablet (500 mg total) by mouth 2 (two) times daily.   OMEPRAZOLE (PRILOSEC) 20 MG CAPSULE    Take 1 capsule (20 mg total) by mouth daily.   POLYETHYLENE GLYCOL POWDER (GLYCOLAX/MIRALAX) POWDER    Take 17 g by mouth 2 (two) times daily. Until daily soft stools  OTC    Final diagnoses:  Concern about STD in female without diagnosis  Gastroesophageal reflux disease with esophagitis  Constipation, unspecified constipation type  Trichimoniasis   This is a 20 y.o. female who presents to the ED complaining of symptoms of acid reflux recently and intermittent bilateral lower abdominal pain with constipation. The patient reports she has been having acid reflux symptoms recently with lots of burping, belching and bad taste in her mouth. She was Prescribed omeprazole but has only been taking this intermittently. She reports she last took 1 tablet 3 days ago. Patient also reports she's been having some bilateral lower abdominal pain when she sits on the toilet and feels that she is constipated. She reports her last bowel movement was yesterday and had a hard stool. She reports she's been having to strain to have a bowel movement and has been constipated. She has been passing gas. He describes the abdominal pain as a sensation of needing to sit on the toilet and have a bowel movement. On exam the patient is afebrile and nontoxic appearing. Her abdomen is soft and nontender to palpation. On pelvic exam patient has clear vaginal discharge. No cervical motion tenderness. No adnexal tenderness or fullness. Urinalysis shows small leukocytes with many bacteria and trichomonas present. Patient has no urinary symptoms. Will send urine for culture. Pregnancy test is negative. Prep reveals trichomoniasis, clue cells and moderate white blood cells. I'm concern for STD. The patient is sexually active and does not use protection. Will treat with  azithromycin and Rocephin. At reevaluation I advised the patient of the test findings. She reports she had a large bowel movement in the emergency department and had a GI cocktail and she reports her pain and symptoms of acid reflux are completely gone. She is feeling back to normal and is ready for discharge. We'll discharge after she receives Rocephin and azithromycin and discharged with prescription for Flagyl for BV and Trichomonas. I discussed safe sex practices. I educated on treatment of GERD and encouraged her to take omeprazole daily for at least 2 weeks. Will discharge with MiraLAX for her constipation. I educated on high fiber diet.  I discussed return precautions. I advised the patient to follow-up with their primary care provider this week. I advised the patient to return to the emergency department with new or worsening symptoms or new concerns. The patient verbalized understanding and agreement with plan.      Everlene Farrier, PA-C 07/10/15 1213  Benjiman Core, MD 07/10/15 1535

## 2015-07-10 NOTE — Discharge Instructions (Signed)
Sexually Transmitted Disease °A sexually transmitted disease (STD) is a disease or infection that may be passed (transmitted) from person to person, usually during sexual activity. This may happen by way of saliva, semen, blood, vaginal mucus, or urine. Common STDs include: °· Gonorrhea. °· Chlamydia. °· Syphilis. °· HIV and AIDS. °· Genital herpes. °· Hepatitis B and C. °· Trichomonas. °· Human papillomavirus (HPV). °· Pubic lice. °· Scabies. °· Mites. °· Bacterial vaginosis. °WHAT ARE CAUSES OF STDs? °An STD may be caused by bacteria, a virus, or parasites. STDs are often transmitted during sexual activity if one person is infected. However, they may also be transmitted through nonsexual means. STDs may be transmitted after:  °· Sexual intercourse with an infected person. °· Sharing sex toys with an infected person. °· Sharing needles with an infected person or using unclean piercing or tattoo needles. °· Having intimate contact with the genitals, mouth, or rectal areas of an infected person. °· Exposure to infected fluids during birth. °WHAT ARE THE SIGNS AND SYMPTOMS OF STDs? °Different STDs have different symptoms. Some people may not have any symptoms. If symptoms are present, they may include: °· Painful or bloody urination. °· Pain in the pelvis, abdomen, vagina, anus, throat, or eyes. °· A skin rash, itching, or irritation. °· Growths, ulcerations, blisters, or sores in the genital and anal areas. °· Abnormal vaginal discharge with or without bad odor. °· Penile discharge in men. °· Fever. °· Pain or bleeding during sexual intercourse. °· Swollen glands in the groin area. °· Yellow skin and eyes (jaundice). This is seen with hepatitis. °· Swollen testicles. °· Infertility. °· Sores and blisters in the mouth. °HOW ARE STDs DIAGNOSED? °To make a diagnosis, your health care provider may: °· Take a medical history. °· Perform a physical exam. °· Take a sample of any discharge to examine. °· Swab the throat,  cervix, opening to the penis, rectum, or vagina for testing. °· Test a sample of your first morning urine. °· Perform blood tests. °· Perform a Pap test, if this applies. °· Perform a colposcopy. °· Perform a laparoscopy. °HOW ARE STDs TREATED? °Treatment depends on the STD. Some STDs may be treated but not cured. °· Chlamydia, gonorrhea, trichomonas, and syphilis can be cured with antibiotic medicine. °· Genital herpes, hepatitis, and HIV can be treated, but not cured, with prescribed medicines. The medicines lessen symptoms. °· Genital warts from HPV can be treated with medicine or by freezing, burning (electrocautery), or surgery. Warts may come back. °· HPV cannot be cured with medicine or surgery. However, abnormal areas may be removed from the cervix, vagina, or vulva. °· If your diagnosis is confirmed, your recent sexual partners need treatment. This is true even if they are symptom-free or have a negative culture or evaluation. They should not have sex until their health care providers say it is okay. °· Your health care provider may test you for infection again 3 months after treatment. °HOW CAN I REDUCE MY RISK OF GETTING AN STD? °Take these steps to reduce your risk of getting an STD: °· Use latex condoms, dental dams, and water-soluble lubricants during sexual activity. Do not use petroleum jelly or oils. °· Avoid having multiple sex partners. °· Do not have sex with someone who has other sex partners °· Do not have sex with anyone you do not know or who is at high risk for an STD. °· Avoid risky sex practices that can break your skin. °· Do not have sex   if you have open sores on your mouth or skin.  Avoid drinking too much alcohol or taking illegal drugs. Alcohol and drugs can affect your judgment and put you in a vulnerable position.  Avoid engaging in oral and anal sex acts.  Get vaccinated for HPV and hepatitis. If you have not received these vaccines in the past, talk to your health care  provider about whether one or both might be right for you.  If you are at risk of being infected with HIV, it is recommended that you take a prescription medicine daily to prevent HIV infection. This is called pre-exposure prophylaxis (PrEP). You are considered at risk if:  You are a man who has sex with other men (MSM).  You are a heterosexual man or woman and are sexually active with more than one partner.  You take drugs by injection.  You are sexually active with a partner who has HIV.  Talk with your health care provider about whether you are at high risk of being infected with HIV. If you choose to begin PrEP, you should first be tested for HIV. You should then be tested every 3 months for as long as you are taking PrEP. WHAT SHOULD I DO IF I THINK I HAVE AN STD?  See your health care provider.  Tell your sexual partner(s). They should be tested and treated for any STDs.  Do not have sex until your health care provider says it is okay. WHEN SHOULD I GET IMMEDIATE MEDICAL CARE? Contact your health care provider right away if:   You have severe abdominal pain.  You are a man and notice swelling or pain in your testicles.  You are a woman and notice swelling or pain in your vagina.   This information is not intended to replace advice given to you by your health care provider. Make sure you discuss any questions you have with your health care provider.   Document Released: 05/26/2002 Document Revised: 03/26/2014 Document Reviewed: 09/23/2012 Elsevier Interactive Patient Education 2016 [employer] Inc[employer] Gastroesophageal Reflux Disease, Adult Normally, food travels down the esophagus and stays in the stomach to be digested. However, when a person has gastroesophageal reflux disease (GERD), food and stomach acid move back up into the esophagus. When this happens, the esophagus becomes sore and inflamed. Over time, GERD can create small holes (ulcers) in the lining of the esophagus.    CAUSES This condition is caused by a problem with the muscle between the esophagus and the stomach (lower esophageal sphincter, or LES). Normally, the LES muscle closes after food passes through the esophagus to the stomach. When the LES is weakened or abnormal, it does not close properly, and that allows food and stomach acid to go back up into the esophagus. The LES can be weakened by certain dietary substances, medicines, and medical conditions, including:  Tobacco use.  Pregnancy.  Having a hiatal hernia.  Heavy alcohol use.  Certain foods and beverages, such as coffee, chocolate, onions, and peppermint. RISK FACTORS This condition is more likely to develop in:  People who have an increased body weight.  People who have connective tissue disorders.  People who use NSAID medicines. SYMPTOMS Symptoms of this condition include:  Heartburn.  Difficult or painful swallowing.  The feeling of having a lump in the throat.  Abitter taste in the mouth.  Bad breath.  Having a large amount of saliva.  Having an upset or bloated stomach.  Belching.  Chest pain.  Shortness of  breath or wheezing.  Ongoing (chronic) cough or a night-time cough.  Wearing away of tooth enamel.  Weight loss. Different conditions can cause chest pain. Make sure to see your health care provider if you experience chest pain. DIAGNOSIS Your health care provider will take a medical history and perform a physical exam. To determine if you have mild or severe GERD, your health care provider may also monitor how you respond to treatment. You may also have other tests, including:  An endoscopy toexamine your stomach and esophagus with a small camera.  A test thatmeasures the acidity level in your esophagus.  A test thatmeasures how much pressure is on your esophagus.  A barium swallow or modified barium swallow to show the shape, size, and functioning of your esophagus. TREATMENT The goal of  treatment is to help relieve your symptoms and to prevent complications. Treatment for this condition may vary depending on how severe your symptoms are. Your health care provider may recommend:  Changes to your diet.  Medicine.  Surgery. HOME CARE INSTRUCTIONS Diet  Follow a diet as recommended by your health care provider. This may involve avoiding foods and drinks such as:  Coffee and tea (with or without caffeine).  Drinks that containalcohol.  Energy drinks and sports drinks.  Carbonated drinks or sodas.  Chocolate and cocoa.  Peppermint and mint flavorings.  Garlic and onions.  Horseradish.  Spicy and acidic foods, including peppers, chili powder, curry powder, vinegar, hot sauces, and barbecue sauce.  Citrus fruit juices and citrus fruits, such as oranges, lemons, and limes.  Tomato-based foods, such as red sauce, chili, salsa, and pizza with red sauce.  Fried and fatty foods, such as donuts, french fries, potato chips, and high-fat dressings.  High-fat meats, such as hot dogs and fatty cuts of red and white meats, such as rib eye steak, sausage, ham, and bacon.  High-fat dairy items, such as whole milk, butter, and cream cheese.  Eat small, frequent meals instead of large meals.  Avoid drinking large amounts of liquid with your meals.  Avoid eating meals during the 2-3 hours before bedtime.  Avoid lying down right after you eat.  Do not exercise right after you eat. General Instructions  Pay attention to any changes in your symptoms.  Take over-the-counter and prescription medicines only as told by your health care provider. Do not take aspirin, ibuprofen, or other NSAIDs unless your health care provider told you to do so.  Do not use any tobacco products, including cigarettes, chewing tobacco, and e-cigarettes. If you need help quitting, ask your health care provider.  Wear loose-fitting clothing. Do not wear anything tight around your waist that  causes pressure on your abdomen.  Raise (elevate) the head of your bed 6 inches (15cm).  Try to reduce your stress, such as with yoga or meditation. If you need help reducing stress, ask your health care provider.  If you are overweight, reduce your weight to an amount that is healthy for you. Ask your health care provider for guidance about a safe weight loss goal.  Keep all follow-up visits as told by your health care provider. This is important. SEEK MEDICAL CARE IF:  You have new symptoms.  You have unexplained weight loss.  You have difficulty swallowing, or it hurts to swallow.  You have wheezing or a persistent cough.  Your symptoms do not improve with treatment.  You have a hoarse voice. SEEK IMMEDIATE MEDICAL CARE IF:  You have pain in your  arms, neck, jaw, teeth, or back.  You feel sweaty, dizzy, or light-headed.  You have chest pain or shortness of breath.  You vomit and your vomit looks like blood or coffee grounds.  You faint.  Your stool is bloody or black.  You cannot swallow, drink, or eat.   This information is not intended to replace advice given to you by your health care provider. Make sure you discuss any questions you have with your health care provider.   Constipation, Adult Constipation is when a person has fewer than three bowel movements a week, has difficulty having a bowel movement, or has stools that are dry, hard, or larger than normal. As people grow older, constipation is more common. A low-fiber diet, not taking in enough fluids, and taking certain medicines may make constipation worse.  CAUSES   Certain medicines, such as antidepressants, pain medicine, iron supplements, antacids, and water pills.   Certain diseases, such as diabetes, irritable bowel syndrome (IBS), thyroid disease, or depression.   Not drinking enough water.   Not eating enough fiber-rich foods.   Stress or travel.   Lack of physical activity or exercise.    Ignoring the urge to have a bowel movement.   Using laxatives too much.  SIGNS AND SYMPTOMS   Having fewer than three bowel movements a week.   Straining to have a bowel movement.   Having stools that are hard, dry, or larger than normal.   Feeling full or bloated.   Pain in the lower abdomen.   Not feeling relief after having a bowel movement.  DIAGNOSIS  Your health care provider will take a medical history and perform a physical exam. Further testing may be done for severe constipation. Some tests may include:  A barium enema X-ray to examine your rectum, colon, and, sometimes, your small intestine.   A sigmoidoscopy to examine your lower colon.   A colonoscopy to examine your entire colon. TREATMENT  Treatment will depend on the severity of your constipation and what is causing it. Some dietary treatments include drinking more fluids and eating more fiber-rich foods. Lifestyle treatments may include regular exercise. If these diet and lifestyle recommendations do not help, your health care provider may recommend taking over-the-counter laxative medicines to help you have bowel movements. Prescription medicines may be prescribed if over-the-counter medicines do not work.  HOME CARE INSTRUCTIONS   Eat foods that have a lot of fiber, such as fruits, vegetables, whole grains, and beans.  Limit foods high in fat and processed sugars, such as french fries, hamburgers, cookies, candies, and soda.   A fiber supplement may be added to your diet if you cannot get enough fiber from foods.   Drink enough fluids to keep your urine clear or pale yellow.   Exercise regularly or as directed by your health care provider.   Go to the restroom when you have the urge to go. Do not hold it.   Only take over-the-counter or prescription medicines as directed by your health care provider. Do not take other medicines for constipation without talking to your health care provider  first.  SEEK IMMEDIATE MEDICAL CARE IF:   You have bright red blood in your stool.   Your constipation lasts for more than 4 days or gets worse.   You have abdominal or rectal pain.   You have thin, pencil-like stools.   You have unexplained weight loss. MAKE SURE YOU:   Understand these instructions.  Will watch your condition.  Will get help right away if you are not doing well or get worse.   This information is not intended to replace advice given to you by your health care provider. Make sure you discuss any questions you have with your health care provider.   Document Released: 12/02/2003 Document Revised: 03/26/2014 Document Reviewed: 12/15/2012 Elsevier Interactive Patient Education 2016 Elsevier Inc.  Document Released: 12/13/2004 Document Revised: 11/24/2014 Document Reviewed: 06/30/2014 Elsevier Interactive Patient Education [employer]

## 2015-07-11 LAB — URINE CULTURE

## 2015-07-11 LAB — GC/CHLAMYDIA PROBE AMP (~~LOC~~) NOT AT ARMC
Chlamydia: NEGATIVE
Neisseria Gonorrhea: NEGATIVE

## 2015-08-15 ENCOUNTER — Telehealth (HOSPITAL_COMMUNITY): Payer: Self-pay

## 2015-08-15 ENCOUNTER — Encounter (HOSPITAL_BASED_OUTPATIENT_CLINIC_OR_DEPARTMENT_OTHER): Payer: Self-pay

## 2015-08-15 ENCOUNTER — Emergency Department (HOSPITAL_BASED_OUTPATIENT_CLINIC_OR_DEPARTMENT_OTHER)
Admission: EM | Admit: 2015-08-15 | Discharge: 2015-08-15 | Disposition: A | Discharge status: Home / Self Care | Payer: Managed Care, Other (non HMO) | Attending: Emergency Medicine | Admitting: Emergency Medicine

## 2015-08-15 DIAGNOSIS — K120 Recurrent oral aphthae: Secondary | ICD-10-CM | POA: Insufficient documentation

## 2015-08-15 DIAGNOSIS — J020 Streptococcal pharyngitis: Secondary | ICD-10-CM | POA: Insufficient documentation

## 2015-08-15 DIAGNOSIS — J45909 Unspecified asthma, uncomplicated: Secondary | ICD-10-CM | POA: Insufficient documentation

## 2015-08-15 DIAGNOSIS — Z7722 Contact with and (suspected) exposure to environmental tobacco smoke (acute) (chronic): Secondary | ICD-10-CM | POA: Insufficient documentation

## 2015-08-15 DIAGNOSIS — R000 Tachycardia, unspecified: Secondary | ICD-10-CM | POA: Insufficient documentation

## 2015-08-15 MED ORDER — GI COCKTAIL ~~LOC~~
30.0000 mL | Freq: Once | ORAL | Status: AC
  Administered 2015-08-15: 30 mL via ORAL
  Filled 2015-08-15: qty 30

## 2015-08-15 MED ORDER — DEXAMETHASONE 10 MG/ML FOR PEDIATRIC ORAL USE
10.0000 mg | Freq: Once | INTRAMUSCULAR | Status: DC
  Filled 2015-08-15: qty 1

## 2015-08-15 MED ORDER — PENICILLIN G BENZATHINE 1200000 UNIT/2ML IM SUSP
1.2000 10*6.[IU] | Freq: Once | INTRAMUSCULAR | Status: AC
  Administered 2015-08-15: 1.2 10*6.[IU] via INTRAMUSCULAR
  Filled 2015-08-15: qty 2

## 2015-08-15 MED ORDER — DEXAMETHASONE 4 MG PO TABS
ORAL_TABLET | ORAL | Status: AC
  Filled 2015-08-15: qty 1

## 2015-08-15 MED ORDER — MAGIC MOUTHWASH W/LIDOCAINE: 5.0000 mL | Freq: Three times a day (TID) | ORAL | Status: DC | PRN

## 2015-08-15 MED ORDER — DEXAMETHASONE 6 MG PO TABS
ORAL_TABLET | ORAL | Status: AC
  Filled 2015-08-15: qty 1

## 2015-08-15 MED ORDER — DEXAMETHASONE 6 MG PO TABS
10.0000 mg | ORAL_TABLET | Freq: Once | ORAL | Status: AC
  Administered 2015-08-15: 10 mg via ORAL

## 2015-08-15 NOTE — ED Provider Notes (Signed)
CSN: [id number]     Arrival date & time 08/15/15  1240 History   First MD Initiated Contact with Patient 08/15/15 1246     Chief Complaint  Patient presents with  . Dental Pain     (Consider location/radiation/quality/duration/timing/severity/associated sxs/prior Treatment) The history is provided by the patient and medical records. No language interpreter was used.     Brianna Byrd is a 20 y.o. female  with a hx of asthma presents to the Emergency Department complaining of gradual, persistent, progressively worsening sore throat onset 3 days ago.  Pt reports that she had fever last night to 101 which resolved with motrin.  She reports that the sore throat is not causing right sided dental pain where her right inferior wisdom tooth is attempting to errupt.  She reports pain with swallowing, but is able to do so.  She reports anterior neck pain, but no neck stiffness.  She reports decreased PO intake simply due to pain.  She states no known sick contacts.  Nothing makes her symptoms worse.  Pt denies posterior neck pain, neck stiffness, chest pain, cough, abd pain, N/V/D, rash.     Past Medical History  Diagnosis Date  . Asthma   . Reflux    History reviewed. No pertinent past surgical history. No family history on file. Social History  Substance Use Topics  . Smoking status: Passive Smoke Exposure - Never Smoker  . Smokeless tobacco: Never Used  . Alcohol Use: No   OB History    No data available     Review of Systems  Constitutional: Positive for fever. Negative for diaphoresis, appetite change, fatigue and unexpected weight change.  HENT: Positive for dental problem and sore throat. Negative for mouth sores.   Eyes: Negative for visual disturbance.  Respiratory: Negative for cough, chest tightness, shortness of breath and wheezing.   Cardiovascular: Negative for chest pain.  Gastrointestinal: Negative for nausea, vomiting, abdominal pain, diarrhea and constipation.   Endocrine: Negative for polydipsia, polyphagia and polyuria.  Genitourinary: Negative for dysuria, urgency, frequency and hematuria.  Musculoskeletal: Positive for neck pain ( anterior). Negative for back pain and neck stiffness.  Skin: Negative for rash.  Allergic/Immunologic: Negative for immunocompromised state.  Neurological: Negative for syncope, light-headedness and headaches.  Hematological: Positive for adenopathy. Does not bruise/bleed easily.  Psychiatric/Behavioral: Negative for sleep disturbance. The patient is not nervous/anxious.       Allergies  [place]  Home Medications   Prior to Admission medications   Medication Sig Start Date End Date Taking? Authorizing Provider  magic mouthwash w/lidocaine SOLN Take 5 mLs by mouth 3 (three) times daily as needed for mouth pain. 08/15/15   Nayleen Janosik, PA-C   BP 114/87 mmHg  Pulse 116  Temp(Src) 99.1 F (37.3 C) (Oral)  Resp 18  Ht 5\' 2"  (1.575 m)  Wt 87.998 kg  BMI 35.47 kg/m2  SpO2 99%  LMP  (LMP Unknown) Physical Exam  Constitutional: She appears well-developed and well-nourished. No distress.  HENT:  Head: Normocephalic and atraumatic.  Right Ear: Tympanic membrane, external ear and ear canal normal.  Left Ear: Tympanic membrane, external ear and ear canal normal.  Nose: Nose normal. No mucosal edema or rhinorrhea.  Mouth/Throat: Uvula is midline and mucous membranes are normal. Mucous membranes are not dry. No trismus in the jaw. No uvula swelling. Oropharyngeal exudate, posterior oropharyngeal edema and posterior oropharyngeal erythema present. No tonsillar abscesses.  Posterior oropharynx with erythema, symmetrical edema and exudate on the tonsils Base of  right posterior tongue and right lower gingival with several aphthous ulcers  Eyes: Conjunctivae are normal.  Neck: Normal range of motion, full passive range of motion without pain and phonation normal. No tracheal tenderness, no spinous process  tenderness and no muscular tenderness present. No rigidity. No erythema and normal range of motion present. No Brudzinski's sign and no Kernig's sign noted.  Range of motion without pain  No midline or paraspinal tenderness Normal phonation No stridor Handling secretions without difficulty No nuchal rigidity or meningeal signs  Cardiovascular: Regular rhythm and normal heart sounds.  Tachycardia present.   Pulses:      Radial pulses are 2+ on the right side, and 2+ on the left side.  Pulmonary/Chest: Effort normal and breath sounds normal. No stridor. No respiratory distress. She has no decreased breath sounds. She has no wheezes.  Equal chest expansion, clear and equal breath sounds without focal wheezes, rhonchi or rales  Musculoskeletal: Normal range of motion.  Lymphadenopathy:       Head (right side): Submandibular and tonsillar adenopathy present. No submental, no preauricular, no posterior auricular and no occipital adenopathy present.       Head (left side): Submandibular and tonsillar adenopathy present. No submental, no preauricular, no posterior auricular and no occipital adenopathy present.    She has cervical adenopathy.       Right cervical: Superficial cervical adenopathy present. No deep cervical and no posterior cervical adenopathy present.      Left cervical: No superficial cervical, no deep cervical and no posterior cervical adenopathy present.  Neurological: She is alert.  Alert and oriented Moves all extremities without ataxia  Skin: Skin is warm and dry. She is not diaphoretic.  Psychiatric: She has a normal mood and affect.  Nursing note and vitals reviewed.   ED Course  Procedures (including critical care time)   MDM   Final diagnoses:  Strep pharyngitis  Aphthous ulcer of tongue   Alfa Popko presents with sore throat and dental pain.  Pt with tonsillar exudate, cervical lymphadenopathy, & dysphagia; diagnosis of strep. Reported fevers at home.  Treated in the Ed with steroids and PCN IM.  Pt appears mildly dehydrated, discussed importance of water rehydration. Presentation non concerning for PTA or infxn spread to soft tissue. Patient significant right-sided pain is likely secondary to her cyst ulcers. GI cocktail given for swish and swallow with improvement in pain. No trismus or uvula deviation. Mild tachycardia is noted on physical exam. Likely secondary to low-grade fever, pain and mild dehydration. Specific return precautions discussed. Pt able to drink water in ED without difficulty with intact air way. Recommended PCP follow up.    Dahlia Client Nayib Remer, PA-C 08/15/15 1656  Jacalyn Lefevre, MD 08/16/15 [phone]

## 2015-08-15 NOTE — Telephone Encounter (Signed)
Pharmacy calling for clarification of Magic Mouthwash w/lidocaine ingredients.  Spoke w/Dr Roselyn BeringJ. Knapp and was asked to clarify w/pharmacy what their recipe is.  Spoke with Pharmacy and given the following recipe: Magic Mouthwash w/lidocaine  120 ml Maalox extra strength 60 ml of Nystatin 300 ml of liquid Benadryl 160 ml of Viscous Lidocaine 2%  Pharmacy informed

## 2015-08-15 NOTE — Discharge Instructions (Signed)
1. Medications: magic mouthwash, usual home medications 2. Treatment: rest, drink plenty of fluids, use medications as directed 3. Follow Up: Please followup with your primary doctor in 3-5 days for discussion of your diagnoses and further evaluation after today's visit; if you do not have a primary care doctor use the resource guide provided to find one; Please return to the ER for worsening difficulty swallowing, persistent high fevers, swelling in your neck or other concerns   Oral Ulcers Oral ulcers are painful, shallow sores around the lining of the mouth. They can affect the gums, the inside of the lips, and the cheeks. (Sores on the outside of the lips and on the face are different.) They typically first occur in school-aged children and teenagers. Oral ulcers may also be called canker sores or cold sores. CAUSES  Canker sores and cold sores can be caused by many factors including:  Infection.  Injury.  Sun exposure.  Medications.  Emotional stress.  Food allergies.  Vitamin deficiencies.  Toothpastes containing sodium lauryl sulfate. The herpes virus can be the cause of mouth ulcers. The first infection can be severe and cause 10 or more ulcers on the gums, tongue, and lips with fever and difficulty in swallowing. This infection usually occurs between the ages of 1 and 3 years.  SYMPTOMS  The typical sore is about  inch (6 mm) in size and is an oval or round ulcer with red borders. DIAGNOSIS  Your caregiver can diagnose simple oral ulcers by examination. Additional testing is usually not required.  TREATMENT  Treatment is aimed at pain relief. Generally, oral ulcers resolve by themselves within 1 to 2 weeks without medication and are not contagious unless caused by herpes (and other viruses). Antibiotics are not effective with mouth sores. Avoid direct contact with others until the ulcer is completely healed. See your caregiver for follow-up care as recommended. Also:  Offer  a soft diet.  Encourage plenty of fluids to prevent dehydration. Popsicles and milk shakes can be helpful.  Avoid acidic and salty foods and drinks such as orange juice.  Infants and young children will often refuse to drink because of pain. Using a teaspoon, cup, or syringe to give small amounts of fluids frequently can help prevent dehydration.  Cold compresses on the face may help reduce pain.  Pain medication can help control soreness.  A solution of diphenhydramine mixed with a liquid antacid can be useful to decrease the soreness of ulcers. Consult a caregiver for the dosing.  Liquids or ointments with a numbing ingredient may be helpful when used as recommended.  Older children and teenagers can rinse their mouth with a salt-water mixture (1/2 teaspoon of salt in 8 ounces of water) four times a day. This treatment is uncomfortable but may reduce the time the ulcers are present.  There are many over-the-counter throat lozenges and medications available for oral ulcers. Their effectiveness has not been studied.  Consult your medical caregiver prior to using homeopathic treatments for oral ulcers. SEEK MEDICAL CARE IF:   You think your child needs to be seen.  The pain worsens and you cannot control it.  There are 4 or more ulcers.  The lips and gums begin to bleed and crust.  A single mouth ulcer is near a tooth that is causing a toothache or pain.  Your child has a fever, swollen face, or swollen glands.  The ulcers began after starting a medication.  Mouth ulcers keep reoccurring or last more than 2  weeks.  You think your child is not taking adequate fluids. SEEK IMMEDIATE MEDICAL CARE IF:   Your child has a high fever.  Your child is unable to swallow or becomes dehydrated.  Your child looks or acts very ill.  An ulcer caused by a chemical your child accidentally put in their mouth.   This information is not intended to replace advice given to you by your  health care provider. Make sure you discuss any questions you have with your health care provider.   Document Released: 04/12/2004 Document Revised: 03/26/2014 Document Reviewed: 07/21/2014 Elsevier Interactive Patient Education [employer]

## 2015-08-15 NOTE — ED Notes (Signed)
C/o pain to gums, right lower tooth ache and right side neck pain-NAD-steady gait

## 2015-08-16 MED FILL — MAGIC MOUTHWASH MCH/LIDOCAI: 2 days supply | Qty: 30 | Fill #0

## 2015-08-17 ENCOUNTER — Encounter (HOSPITAL_BASED_OUTPATIENT_CLINIC_OR_DEPARTMENT_OTHER): Payer: Self-pay | Admitting: *Deleted

## 2015-08-17 ENCOUNTER — Emergency Department (HOSPITAL_BASED_OUTPATIENT_CLINIC_OR_DEPARTMENT_OTHER)
Admission: EM | Admit: 2015-08-17 | Discharge: 2015-08-17 | Disposition: A | Discharge status: Home / Self Care | Payer: Managed Care, Other (non HMO) | Attending: Emergency Medicine | Admitting: Emergency Medicine

## 2015-08-17 DIAGNOSIS — K219 Gastro-esophageal reflux disease without esophagitis: Secondary | ICD-10-CM

## 2015-08-17 DIAGNOSIS — Z7722 Contact with and (suspected) exposure to environmental tobacco smoke (acute) (chronic): Secondary | ICD-10-CM | POA: Insufficient documentation

## 2015-08-17 DIAGNOSIS — J45909 Unspecified asthma, uncomplicated: Secondary | ICD-10-CM | POA: Insufficient documentation

## 2015-08-17 MED ORDER — GI COCKTAIL ~~LOC~~
30.0000 mL | Freq: Once | ORAL | Status: AC
  Administered 2015-08-17: 30 mL via ORAL
  Filled 2015-08-17: qty 30

## 2015-08-17 MED ORDER — OMEPRAZOLE 20 MG PO CPDR: 20.0000 mg | DELAYED_RELEASE_CAPSULE | Freq: Two times a day (BID) | ORAL | Status: DC

## 2015-08-17 NOTE — ED Notes (Signed)
Seen here 2 days ago for positive strep, pt states she has throat and chest pain when she eats, generalized weakness and " dehydration"

## 2015-08-17 NOTE — Discharge Instructions (Signed)
Gastroesophageal Reflux Disease, Adult Normally, food travels down the esophagus and stays in the stomach to be digested. If a person has gastroesophageal reflux disease (GERD), food and stomach acid move back up into the esophagus. When this happens, the esophagus becomes sore and swollen (inflamed). Over time, GERD can make small holes (ulcers) in the lining of the esophagus. HOME CARE Diet  Follow a diet as told by your doctor. You may need to avoid foods and drinks such as:  Coffee and tea (with or without caffeine).  Drinks that contain alcohol.  Energy drinks and sports drinks.  Carbonated drinks or sodas.  Chocolate and cocoa.  Peppermint and mint flavorings.  Garlic and onions.  Horseradish.  Spicy and acidic foods, such as peppers, chili powder, curry powder, vinegar, hot sauces, and BBQ sauce.  Citrus fruit juices and citrus fruits, such as oranges, lemons, and limes.  Tomato-based foods, such as red sauce, chili, salsa, and pizza with red sauce.  Fried and fatty foods, such as donuts, french fries, potato chips, and high-fat dressings.  High-fat meats, such as hot dogs, rib eye steak, sausage, ham, and bacon.  High-fat dairy items, such as whole milk, butter, and cream cheese.  Eat small meals often. Avoid eating large meals.  Avoid drinking large amounts of liquid with your meals.  Avoid eating meals during the 2-3 hours before bedtime.  Avoid lying down right after you eat.  Do not exercise right after you eat. General Instructions  Pay attention to any changes in your symptoms.  Take over-the-counter and prescription medicines only as told by your doctor. Do not take aspirin, ibuprofen, or other NSAIDs unless your doctor says it is okay.  Do not use any tobacco products, including cigarettes, chewing tobacco, and e-cigarettes. If you need help quitting, ask your doctor.  Wear loose clothes. Do not wear anything tight around your waist.  Raise  (elevate) the head of your bed about 6 inches (15 cm).  Try to lower your stress. If you need help doing this, ask your doctor.  If you are overweight, lose an amount of weight that is healthy for you. Ask your doctor about a safe weight loss goal.  Keep all follow-up visits as told by your doctor. This is important. GET HELP IF:  You have new symptoms.  You lose weight and you do not know why it is happening.  You have trouble swallowing, or it hurts to swallow.  You have wheezing or a cough that keeps happening.  Your symptoms do not get better with treatment.  You have a hoarse voice. GET HELP RIGHT AWAY IF:  You have pain in your arms, neck, jaw, teeth, or back.  You feel sweaty, dizzy, or light-headed.  You have chest pain or shortness of breath.  You throw up (vomit) and your throw up looks like blood or coffee grounds.  You pass out (faint).  Your poop (stool) is bloody or black.  You cannot swallow, drink, or eat.   This information is not intended to replace advice given to you by your health care provider. Make sure you discuss any questions you have with your health care provider.   Document Released: 08/22/2007 Document Revised: 11/24/2014 Document Reviewed: 06/30/2014 Elsevier Interactive Patient Education 2016 Else[employer] Inc[employer]  -   

## 2015-08-17 NOTE — ED Notes (Signed)
MD at bedside. 

## 2015-08-17 NOTE — ED Notes (Signed)
Pt watching TV with family at bedside. Pt states the strep has continued to get worse and pt is unable to eat or drink. Pt states she was unable to find a PCP to follow up with from previous visit.

## 2015-08-28 NOTE — ED Provider Notes (Signed)
CSN: [id number]     Arrival date & time 08/17/15  1537 History   First MD Initiated Contact with Patient 08/17/15 1616     Chief Complaint  Patient presents with  . Weakness     (Consider location/radiation/quality/duration/timing/severity/associated sxs/prior Treatment) HPI   [name] with CP. Seen here 2 days ago for positive strep, pt states she has throat and chest pain when she eats. Throat pain has been improving But she is having a burning sensation in her upper abdomen and chest. This is worse right after eating. Mild nausea. No vomiting. Does not feel short of breath. Dizziness lightheadedness. Her by mouth intake has been decreased secondary to not feeling well in her throat pain.  Past Medical History  Diagnosis Date  . Asthma   . Reflux    History reviewed. No pertinent past surgical history. History reviewed. No pertinent family history. Social History  Substance Use Topics  . Smoking status: Passive Smoke Exposure - Never Smoker  . Smokeless tobacco: Never Used  . Alcohol Use: No   OB History    No data available     Review of Systems  All systems reviewed and negative, other than as noted in HPI.   Allergies  Peach  Home Medications   Prior to Admission medications   Medication Sig Start Date End Date Taking? Authorizing Provider  magic mouthwash w/lidocaine SOLN Take 5 mLs by mouth 3 (three) times daily as needed for mouth pain. 08/15/15   Hannah Muthersbaugh, PA-C  omeprazole (PRILOSEC) 20 MG capsule Take 1 capsule (20 mg total) by mouth 2 (two) times daily before a meal. 08/17/15   Raeford RazorStephen Ariabella Brien, MD   BP 111/80 mmHg  Pulse 101  Temp(Src) 99.2 F (37.3 C) (Oral)  Resp 16  Ht 5\' 2"  (1.575 m)  Wt 194 lb (87.998 kg)  BMI 35.47 kg/m2  SpO2 100%  LMP  (LMP Unknown) Physical Exam  Constitutional: She appears well-developed and well-nourished. No distress.  HENT:  Head: Normocephalic and atraumatic.  Some pharyngeal erythema. Uvula is midline. Handling  secretions. Normal sounding voice. Neck supple. Submental tissues are soft.  Eyes: Conjunctivae are normal. Right eye exhibits no discharge. Left eye exhibits no discharge.  Neck: Neck supple.  Cardiovascular: Normal rate, regular rhythm and normal heart sounds.  Exam reveals no gallop and no friction rub.   No murmur heard. Pulmonary/Chest: Effort normal and breath sounds normal. No respiratory distress.  Abdominal: Soft. She exhibits no distension. There is no tenderness.  Musculoskeletal: She exhibits no edema or tenderness.  Neurological: She is alert.  Skin: Skin is warm and dry.  Psychiatric: She has a normal mood and affect. Her behavior is normal. Thought content normal.  Nursing note and vitals reviewed.   ED Course  Procedures (including critical care time) Labs Review Labs Reviewed - No data to display  Imaging Review No results found. I have personally reviewed and evaluated these images and lab results as part of my medical decision-making.   EKG Interpretation None      MDM   Final diagnoses:  Gastroesophageal reflux disease, esophagitis presence not specified    20 year old female with symptoms seem consistent with reflux and/or gastritis. She was just in the emergency room and diagnosed with strep pharyngitis 2 days ago. Symptoms then have improved although still persistent. She was just seen in the ED a couple days ago and treated for strep pharyngitis. Those symptoms improving and I think current complaint s from different process.  Raeford Razor, MD 08/29/15 [phone]

## 2015-09-27 ENCOUNTER — Encounter: Payer: Self-pay | Admitting: *Deleted

## 2015-09-27 ENCOUNTER — Telehealth: Payer: Self-pay | Admitting: *Deleted

## 2015-09-27 NOTE — Telephone Encounter (Signed)
Pre-Visit Call completed with patient and chart updated.   Pre-Visit Info documented in Specialty Comments under [employer].    

## 2015-09-28 ENCOUNTER — Ambulatory Visit: Payer: Self-pay | Admitting: Family

## 2015-09-28 DIAGNOSIS — Z0289 Encounter for other administrative examinations: Secondary | ICD-10-CM

## 2015-09-29 ENCOUNTER — Telehealth: Payer: Self-pay | Admitting: General Practice

## 2015-09-29 NOTE — Telephone Encounter (Signed)
Patient was scheduled with you as a New Patient yesterday 7/12 and did not show up. Charge or No Charge?

## 2015-09-29 NOTE — Telephone Encounter (Signed)
No charge. Do not reschedule patient with me.

## 2015-12-16 ENCOUNTER — Emergency Department (HOSPITAL_BASED_OUTPATIENT_CLINIC_OR_DEPARTMENT_OTHER)
Admission: EM | Admit: 2015-12-16 | Discharge: 2015-12-16 | Disposition: A | Discharge status: Home / Self Care | Payer: Self-pay | Attending: Emergency Medicine | Admitting: Emergency Medicine

## 2015-12-16 ENCOUNTER — Encounter (HOSPITAL_BASED_OUTPATIENT_CLINIC_OR_DEPARTMENT_OTHER): Payer: Self-pay

## 2015-12-16 DIAGNOSIS — J029 Acute pharyngitis, unspecified: Secondary | ICD-10-CM | POA: Insufficient documentation

## 2015-12-16 DIAGNOSIS — J45909 Unspecified asthma, uncomplicated: Secondary | ICD-10-CM | POA: Insufficient documentation

## 2015-12-16 LAB — RAPID STREP SCREEN (MED CTR MEBANE ONLY): Streptococcus, Group A Screen (Direct): NEGATIVE

## 2015-12-16 NOTE — ED Provider Notes (Signed)
[facility]   CSN: [id number] Arrival date & time: 12/16/15  1548   By signing my name below, I, Christel Mormon, attest that this documentation has been prepared under the direction and in the presence of Buel Ream, PA-C. Electronically Signed: Christel Mormon, Scribe. 12/16/2015. 4:13 PM.   History   Chief Complaint Chief Complaint  Patient presents with  . Sore Throat    The history is provided by the patient. No language interpreter was used.   Associated symptoms include headaches. Pertinent negatives include no chest pain, no abdominal pain and no shortness of breath.   HPI Comments:  Brianna Byrd is a 20 y.o. female with PMHx of asthma who presents to the Emergency Department complaining of sore throat x3 days. Pt reports "white pockets" on tonsils, associated pain with swallowing, and intermittent dull, mild headache. Pt states that she had a fever of 100.1 last night but it has since resolved. She has been taking extra strength Tylenol with minimal relief. Pt denies cough, congestion, chest pain, abdominal pain, nausea, vomiting, SOB, drooling, inability to open mouth, voice change, asymmetrical swelling of her tonsils or neck.  Past Medical History:  Diagnosis Date  . Asthma   . Reflux     There are no active problems to display for this patient.   History reviewed. No pertinent surgical history.  OB History    No data available       Home Medications    Prior to Admission medications   Not on File    Family History No family history on file.  Social History Social History  Substance Use Topics  . Smoking status: Never Smoker  . Smokeless tobacco: Never Used  . Alcohol use No     Allergies   Peach [prunus persica]   Review of Systems Review of Systems  Constitutional: Negative for chills and fever.  HENT: Positive for sore throat and trouble swallowing (pain but without difficulty). Negative for  congestion, facial swelling and voice change.   Respiratory: Negative for cough and shortness of breath.   Cardiovascular: Negative for chest pain.  Gastrointestinal: Negative for abdominal pain, nausea and vomiting.  Genitourinary: Negative for dysuria.  Musculoskeletal: Negative for back pain.  Skin: Negative for rash and wound.  Neurological: Positive for headaches.  Psychiatric/Behavioral: The patient is not nervous/anxious.      Physical Exam Updated Vital Signs BP 116/80 (BP Location: Left Arm)   Pulse 94   Temp 98.4 F (36.9 C) (Oral)   Resp 18   Ht 5\' 1"  (1.549 m)   Wt 88 kg   LMP 11/29/2015   SpO2 100%   BMI 36.66 kg/m   Physical Exam  Constitutional: She appears well-developed and well-nourished. No distress.  HENT:  Head: Normocephalic and atraumatic.  Mouth/Throat: Mucous membranes are normal. No trismus in the jaw. No uvula swelling. Posterior oropharyngeal edema present. No oropharyngeal exudate, posterior oropharyngeal erythema or tonsillar abscesses. Tonsils are 1+ on the right. Tonsils are 1+ on the left. No tonsillar exudate.  Eyes: Conjunctivae are normal. Pupils are equal, round, and reactive to light. Right eye exhibits no discharge. Left eye exhibits no discharge. No scleral icterus.  Neck: Normal range of motion. Neck supple. No thyromegaly present.  Cardiovascular: Normal rate, regular rhythm, normal heart sounds and intact distal pulses.  Exam reveals no gallop and no friction rub.   No murmur heard. Pulmonary/Chest: Effort normal and breath sounds normal. No stridor. No respiratory distress. She has no  wheezes. She has no rales.  Abdominal: Soft. Bowel sounds are normal. She exhibits no distension. There is no tenderness. There is no rebound and no guarding.  Musculoskeletal: She exhibits no edema.  Lymphadenopathy:    She has no cervical adenopathy.  Neurological: She is alert. Coordination normal.  Skin: Skin is warm and dry. No rash noted. She is  not diaphoretic. No pallor.  Psychiatric: She has a normal mood and affect.  Nursing note and vitals reviewed.    ED Treatments / Results  DIAGNOSTIC STUDIES:  Oxygen Saturation is 100% on RA, normal by my interpretation.    COORDINATION OF CARE:  4:13 PM Discussed treatment plan with pt at bedside and pt agreed to plan.   Labs (all labs ordered are listed, but only abnormal results are displayed) Labs Reviewed  RAPID STREP SCREEN (NOT AT [facility])  CULTURE, GROUP A STREP [facility])    EKG  EKG Interpretation None       Radiology No results found.  Procedures Procedures (including critical care time)  Medications Ordered in ED Medications - No data to display   Initial Impression / Assessment and Plan / ED Course  I have reviewed the triage vital signs and the nursing notes.  Pertinent labs & imaging results that were available during my care of the patient were reviewed by me and considered in my medical decision making (see chart for details).  Clinical Course    Pt with negative strep. Diagnosis of viral pharyngitis. No abx indicated at this time. Discussed that results of strep culture are pending and patient will be informed if positive result and abx will be called in at that time. Discharge with symptomatic tx. No evidence of dehydration. Pt is tolerating secretions. Presentation not concerning for peritonsillar abscess or spread of infection to deep spaces of the throat; patent airway, afebrile. Specific return precautions discussed. Patient vitals stable. ED course and discharge in satisfactory condition.  Final Clinical Impressions(s) / ED Diagnoses   Final diagnoses:  Viral pharyngitis    New Prescriptions New Prescriptions   No medications on file  I personally performed the services described in this documentation, which was scribed in my presence. The recorded information has been reviewed and is accurate.     Emi Holeslexandra M Cloud Graham, PA-C 12/16/15  1627    Alvira MondayErin Schlossman, MD 12/17/15 [phone]

## 2015-12-16 NOTE — ED Triage Notes (Signed)
C/o sore throat x 3 days-NAD-steady gait 

## 2015-12-16 NOTE — Discharge Instructions (Signed)
Treatment: Continue taking Tylenol or ibuprofen as you have been doing at home. You can also try warm, salt water gargles 3-4 times daily to soothe your throat. You can also find Chloroseptic spray other the counter, which can also help numb your throat. You will be called in 2-3 days if your strep culture returns positive and you need antibiotics for treatment.  Follow-up: Please return to the emergency department if you develop any new or worsening symptoms such as the ones we discussed, or any other new or concerning symptoms.

## 2015-12-19 LAB — CULTURE, GROUP A STREP (THRC)

## 2016-01-08 ENCOUNTER — Encounter (HOSPITAL_BASED_OUTPATIENT_CLINIC_OR_DEPARTMENT_OTHER): Payer: Self-pay | Admitting: Emergency Medicine

## 2016-01-08 ENCOUNTER — Emergency Department (HOSPITAL_BASED_OUTPATIENT_CLINIC_OR_DEPARTMENT_OTHER)
Admission: EM | Admit: 2016-01-08 | Discharge: 2016-01-08 | Disposition: A | Discharge status: Home / Self Care | Payer: Self-pay | Attending: Emergency Medicine | Admitting: Emergency Medicine

## 2016-01-08 DIAGNOSIS — Y9339 Activity, other involving climbing, rappelling and jumping off: Secondary | ICD-10-CM | POA: Insufficient documentation

## 2016-01-08 DIAGNOSIS — J45909 Unspecified asthma, uncomplicated: Secondary | ICD-10-CM | POA: Insufficient documentation

## 2016-01-08 DIAGNOSIS — X509XXA Other and unspecified overexertion or strenuous movements or postures, initial encounter: Secondary | ICD-10-CM | POA: Insufficient documentation

## 2016-01-08 DIAGNOSIS — Y999 Unspecified external cause status: Secondary | ICD-10-CM | POA: Insufficient documentation

## 2016-01-08 DIAGNOSIS — Y929 Unspecified place or not applicable: Secondary | ICD-10-CM | POA: Insufficient documentation

## 2016-01-08 DIAGNOSIS — S29011A Strain of muscle and tendon of front wall of thorax, initial encounter: Secondary | ICD-10-CM

## 2016-01-08 MED ORDER — METHOCARBAMOL 500 MG PO TABS: 500.0000 mg | ORAL_TABLET | Freq: Two times a day (BID) | ORAL | 0 refills | Status: DC

## 2016-01-08 MED ORDER — NAPROXEN 500 MG PO TABS: 500.0000 mg | ORAL_TABLET | Freq: Two times a day (BID) | ORAL | 0 refills | Status: DC

## 2016-01-08 MED ORDER — METHOCARBAMOL 500 MG PO TABS
1000.0000 mg | ORAL_TABLET | Freq: Once | ORAL | Status: AC
  Administered 2016-01-08: 1000 mg via ORAL
  Filled 2016-01-08: qty 2

## 2016-01-08 MED ORDER — KETOROLAC TROMETHAMINE 30 MG/ML IJ SOLN
30.0000 mg | Freq: Once | INTRAMUSCULAR | Status: AC
  Administered 2016-01-08: 30 mg via INTRAMUSCULAR
  Filled 2016-01-08: qty 1

## 2016-01-08 NOTE — ED Provider Notes (Signed)
[facility]   CSN: [id number] Arrival date & time: 01/08/16  1842  By signing my name below, I, Soijett Blue, attest that this documentation has been prepared under the direction and in the presence of Audry Pili, PA-C Electronically Signed: Soijett Blue, ED Scribe. 01/08/16. 6:58 PM.   History   Chief Complaint Chief Complaint  Patient presents with  . Chest Pain    HPI  Brianna Byrd is a 20 y.o. female with a PMHx of asthma, who presents to the Emergency Department complaining of sharp/aching, left sided CP onset 1 hour ago. She notes that she was outside and attempted to jump onto a trunk while running from dogs when the left sided CP began. Notes lifting her body into the truck with her arms when initial pain started. Pt notes that her left sided CP is worsened with movement of her left arm and alleviated with rest. She states that she has not tried any medications for the relief of her symptoms. She denies color change, rash, wound, fever, chills, and any other symptoms. Denies allergies to any medications. Pt states that she is otherwise healthy.   The history is provided by the patient. No language interpreter was used.    Past Medical History:  Diagnosis Date  . Asthma   . Reflux     There are no active problems to display for this patient.   No past surgical history on file.  OB History    No data available       Home Medications    Prior to Admission medications   Not on File    Family History No family history on file.  Social History Social History  Substance Use Topics  . Smoking status: Never Smoker  . Smokeless tobacco: Never Used  . Alcohol use No     Allergies   Peach [prunus persica]   Review of Systems Review of Systems A complete 10 system review of systems was obtained and all systems are negative except as noted in the HPI and PMH.   Physical Exam Updated Vital Signs BP 136/82   Pulse 105    Temp 99.4 F (37.4 C)   Resp 25   Ht 5\' 1"  (1.549 m)   Wt 194 lb (88 kg)   LMP 11/29/2015   SpO2 100%   BMI 36.66 kg/m   Physical Exam  Constitutional: She is oriented to person, place, and time. She appears well-developed and well-nourished. No distress.  HENT:  Head: Normocephalic and atraumatic.  Eyes: EOM are normal.  Neck: Neck supple.  Cardiovascular: Regular rhythm and normal heart sounds.  Tachycardia present.  Exam reveals no gallop and no friction rub.   No murmur heard. Pulmonary/Chest: Effort normal and breath sounds normal. No respiratory distress. She has no wheezes. She has no rales. She exhibits tenderness. She exhibits no deformity.  TTP of anterior chest wall. No visible or palpable deformities. No erythema or ecchymosis. Pain reproduced with abduction of left shoulder. Lungs are clear to auscultation.  Abdominal: She exhibits no distension.  Musculoskeletal: Normal range of motion.  Neurological: She is alert and oriented to person, place, and time.  Skin: Skin is warm and dry.  Psychiatric: She has a normal mood and affect. Her behavior is normal.  Nursing note and vitals reviewed.   ED Treatments / Results  DIAGNOSTIC STUDIES: Oxygen Saturation is 100% on RA, nl by my interpretation.    COORDINATION OF CARE: 6:54 PM Discussed treatment plan  with pt at bedside which includes robaxin and toradol injection and pt agreed to plan.  Procedures Procedures (including critical care time)  Medications Ordered in ED Medications - No data to display   Initial Impression / Assessment and Plan / ED Course  I have reviewed the triage vital signs and the nursing notes.   Clinical Course   I have reviewed the relevant previous healthcare records. I obtained HPI from historian. Patient discussed with supervising physician  ED Course:  Assessment: Pt is a [name] with no significant PMH who presents with left chest wall pain s/p mechanical injury with lifting into  truck. On exam, pt in NAD.  Chest pain is not likely of cardiac or pulmonary etiology d/t presentation, perc negative, VSS, no tracheal deviation, no JVD or new murmur, RRR, breath sounds equal bilaterally. Left chest wall TTP. Pain worsened with abduction of shoulder of left. Likely strain of pectoral muscle. Lungs CTA. No indication for pneumothorax. Given Toradol and robaxin in ED. Symptoms improved in ED. Plan is to DC home with follow up to PCP. Given Rx Robaxin. At time of discharge, Patient is in no acute distress. Vital Signs are stable. Patient is able to ambulate. Patient able to tolerate PO.   Disposition/Plan:  DC Home Additional Verbal discharge instructions given and discussed with patient.  Pt Instructed to f/u with PCP in the next week for evaluation and treatment of symptoms. Return precautions given Pt acknowledges and agrees with plan  Supervising Physician Alvira MondayErin Schlossman, MD   Final Clinical Impressions(s) / ED Diagnoses   Final diagnoses:  Muscle strain of chest wall, initial encounter    New Prescriptions New Prescriptions   No medications on file   I personally performed the services described in this documentation, which was scribed in my presence. The recorded information has been reviewed and is accurate.    Audry Piliyler Shianne Zeiser, PA-C 01/08/16 1943    Alvira MondayErin Schlossman, MD 01/09/16 [phone]

## 2016-01-08 NOTE — ED Notes (Signed)
Small ice pack applied to chest.

## 2016-01-08 NOTE — Discharge Instructions (Signed)
Please read and follow all provided instructions.  Your diagnoses today include:  1. Muscle strain of chest wall, initial encounter    Tests performed today include: Vital signs. See below for your results today.   Medications prescribed:  Take as prescribed   Home care instructions:  Follow any educational materials contained in this packet.  Follow-up instructions: Please follow-up with your primary care provider for further evaluation of symptoms and treatment   Return instructions:  Please return to the Emergency Department if you do not get better, if you get worse, or new symptoms OR  - Fever (temperature greater than 101.53F)  - Bleeding that does not stop with holding pressure to the area    -Severe pain (please note that you may be more sore the day after your accident)  - Chest Pain  - Difficulty breathing  - Severe nausea or vomiting  - Inability to tolerate food and liquids  - Passing out  - Skin becoming red around your wounds  - Change in mental status (confusion or lethargy)  - New numbness or weakness    Please return if you have any other emergent concerns.  Additional Information:  Your vital signs today were: BP 136/82    Pulse 105    Temp 99.4 F (37.4 C)    Resp 25    Ht 5\' 1"  (1.549 m)    Wt 88 kg    LMP 11/29/2015    SpO2 100%    BMI 36.66 kg/m  If your blood pressure (BP) was elevated above 135/85 this visit, please have this repeated by your doctor within one month. ---------------

## 2016-01-08 NOTE — ED Notes (Signed)
patient is in good condition, medications discussed, pain management discussed, follow up care discussed, patient verbalized understanding, patient is ambulatory and went home with a friend

## 2016-01-08 NOTE — ED Triage Notes (Signed)
Pt in c/o L chest pain after jumping on the back of a car and feeling a sharp pain in L chest. Pt is tearful, alert, interactive, ambulatory in NAD.

## 2016-01-28 ENCOUNTER — Encounter (HOSPITAL_BASED_OUTPATIENT_CLINIC_OR_DEPARTMENT_OTHER): Payer: Self-pay | Admitting: Emergency Medicine

## 2016-01-28 ENCOUNTER — Emergency Department (HOSPITAL_BASED_OUTPATIENT_CLINIC_OR_DEPARTMENT_OTHER)
Admission: EM | Admit: 2016-01-28 | Discharge: 2016-01-28 | Disposition: A | Discharge status: Home / Self Care | Payer: Self-pay | Attending: Emergency Medicine | Admitting: Emergency Medicine

## 2016-01-28 DIAGNOSIS — J45909 Unspecified asthma, uncomplicated: Secondary | ICD-10-CM | POA: Insufficient documentation

## 2016-01-28 DIAGNOSIS — H1032 Unspecified acute conjunctivitis, left eye: Secondary | ICD-10-CM | POA: Insufficient documentation

## 2016-01-28 MED ORDER — ERYTHROMYCIN 5 MG/GM OP OINT: TOPICAL_OINTMENT | OPHTHALMIC | 0 refills | Status: DC

## 2016-01-28 MED ORDER — ONDANSETRON HCL 4 MG/2ML IJ SOLN: 4.0000 mg | Freq: Once | INTRAMUSCULAR | Status: DC

## 2016-01-28 MED ORDER — SODIUM CHLORIDE 0.9 % IV BOLUS (SEPSIS): 1000.0000 mL | Freq: Once | INTRAVENOUS | Status: DC

## 2016-01-28 NOTE — ED Triage Notes (Signed)
L eye drainage, pain, and redness since yesterday. 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

## 2016-01-28 NOTE — ED Notes (Signed)
Called for triage, no answer

## 2016-01-28 NOTE — Discharge Instructions (Signed)
Please read attached information. If you experience any new or worsening signs or symptoms please return to the emergency room for evaluation. Please follow-up with your primary care provider or specialist as discussed. Please use medication prescribed only as directed and discontinue taking if you have any concerning signs or symptoms.   °

## 2016-01-28 NOTE — ED Provider Notes (Signed)
[facility]   CSN: [id number] Arrival date & time: 01/28/16  1132     History   Chief Complaint Chief Complaint  Patient presents with  . Eye Drainage    HPI Brianna Byrd is a 20 y.o. female.  HPI    20 year old female presents today with redness to left eye. Patient reports she noticed yesterday that her eye was uncomfortable, red, and had puffiness around the eyelids. She reports her vision is intact, sometimes blurry due to watery drainage. She denies any painful ocular movements, denies any pain surrounding the orbit. Patient reports she's also recently had a upper respiratory infection with sinus congestion and rhinorrhea, denies any fever. Patient does not wear contacts. No known foreign body exposure. Patient reports she has been around numerous people with conjunctivitis.   Past Medical History:  Diagnosis Date  . Asthma   . Reflux     There are no active problems to display for this patient.   History reviewed. No pertinent surgical history.  OB History    No data available       Home Medications    Prior to Admission medications   Medication Sig Start Date End Date Taking? Authorizing Provider  erythromycin ophthalmic ointment Place a 1/2 inch ribbon of ointment into the lower eyelid 6 times per day for 5 days 01/28/16   Eyvonne MechanicJeffrey Aitan Rossbach, PA-C  methocarbamol (ROBAXIN) 500 MG tablet Take 1 tablet (500 mg total) by mouth 2 (two) times daily. 01/08/16   Audry Piliyler Mohr, PA-C  naproxen (NAPROSYN) 500 MG tablet Take 1 tablet (500 mg total) by mouth 2 (two) times daily. 01/08/16   Audry Piliyler Mohr, PA-C    Family History No family history on file.  Social History Social History  Substance Use Topics  . Smoking status: Never Smoker  . Smokeless tobacco: Never Used  . Alcohol use No     Allergies   Peach [prunus persica]   Review of Systems Review of Systems  All other systems reviewed and are negative.    Physical  Exam Updated Vital Signs BP 110/73 (BP Location: Left Arm)   Pulse 94   Temp 98.4 F (36.9 C) (Oral)   Resp 16   Ht 5\' 2"  (1.575 m)   Wt 88 kg   LMP 12/12/2015   SpO2 100%   BMI 35.48 kg/m   Physical Exam  Constitutional: She is oriented to person, place, and time. She appears well-developed and well-nourished.  HENT:  Head: Normocephalic and atraumatic.  Nose: Rhinorrhea present.  Eyes: Conjunctivae are normal. Pupils are equal, round, and reactive to light. Right eye exhibits no discharge. Left eye exhibits no discharge. No scleral icterus.  Bulbar palpebral conjunctival injection, watery discharge, no purulent discharge. Pupils are equal round and reactive to light. Extraocular movements are intact and pain-free. No signs of surrounding cellulitis. No signs of periorbital or orbital cellulitis  Neck: Normal range of motion. No JVD present. No tracheal deviation present.  Pulmonary/Chest: Effort normal. No stridor.  Neurological: She is alert and oriented to person, place, and time. Coordination normal.  Psychiatric: She has a normal mood and affect. Her behavior is normal. Judgment and thought content normal.  Nursing note and vitals reviewed.    ED Treatments / Results  Labs (all labs ordered are listed, but only abnormal results are displayed) Labs Reviewed - No data to display  EKG  EKG Interpretation None       Radiology No results found.  Procedures Procedures (  including critical care time)  Medications Ordered in ED Medications - No data to display   Initial Impression / Assessment and Plan / ED Course  I have reviewed the triage vital signs and the nursing notes.  Pertinent labs & imaging results that were available during my care of the patient were reviewed by me and considered in my medical decision making (see chart for details).  Clinical Course      Final Clinical Impressions(s) / ED Diagnoses   Final diagnoses:  Acute conjunctivitis of  left eye, unspecified acute conjunctivitis type   Labs:  Imaging:  Consults:  Therapeutics:  Discharge Meds:   Assessment/Plan:  20 year old female presents today with likely conjunctivitis. Patient has watery discharge, reports that she had mucous discharge. She is non-contact lens wear. She has no signs of septal or preseptal cellulitis. Patient will be discharged with appropriate antibiotics, instructed to follow-up in 2-3 days for reevaluation of symptoms persist, return immediately if they worsen. She verbalizes understanding and agreement to today's plan.     New Prescriptions New Prescriptions   ERYTHROMYCIN OPHTHALMIC OINTMENT    Place a 1/2 inch ribbon of ointment into the lower eyelid 6 times per day for 5 days     Eyvonne MechanicJeffrey Audreena Sachdeva, PA-C 01/28/16 1243    Laurence Spatesachel Morgan Little, MD 01/29/16 [phone]

## 2016-01-30 ENCOUNTER — Emergency Department (HOSPITAL_BASED_OUTPATIENT_CLINIC_OR_DEPARTMENT_OTHER)
Admission: EM | Admit: 2016-01-30 | Discharge: 2016-01-30 | Disposition: A | Discharge status: Home / Self Care | Payer: Self-pay | Attending: Emergency Medicine | Admitting: Emergency Medicine

## 2016-01-30 ENCOUNTER — Encounter (HOSPITAL_BASED_OUTPATIENT_CLINIC_OR_DEPARTMENT_OTHER): Payer: Self-pay | Admitting: *Deleted

## 2016-01-30 DIAGNOSIS — J45909 Unspecified asthma, uncomplicated: Secondary | ICD-10-CM | POA: Insufficient documentation

## 2016-01-30 DIAGNOSIS — H1032 Unspecified acute conjunctivitis, left eye: Secondary | ICD-10-CM | POA: Insufficient documentation

## 2016-01-30 MED ORDER — CIPROFLOXACIN HCL 0.3 % OP SOLN: 2.0000 [drp] | OPHTHALMIC | 0 refills | Status: DC

## 2016-01-30 MED ORDER — MOXIFLOXACIN HCL 0.5 % OP SOLN: 1.0000 [drp] | Freq: Three times a day (TID) | OPHTHALMIC | 0 refills | Status: DC

## 2016-01-30 MED FILL — CIPROFLOXACIN 0.3% EYE DROP: 0.3 | 30 days supply | Qty: 5 | Fill #0

## 2016-01-30 NOTE — Discharge Instructions (Signed)
Return here as needed. Stop the ointment. Follow up with the eye doctor provided.

## 2016-01-30 NOTE — ED Triage Notes (Signed)
Pt reports being seen here Saturday for eye infection, states she is worse today.

## 2016-01-30 NOTE — ED Provider Notes (Signed)
[facility]   CSN: [id number] Arrival date & time: 01/30/16  1546  By signing my name below, I, Phillis HaggisGabriella Gaje, attest that this documentation has been prepared under the direction and in the presence of Eli Lilly and CompanyChristopher Kristena Wilhelmi, PA-C. Electronically Signed: Phillis HaggisGabriella Gaje, ED Scribe. 01/30/16. 4:17 PM.  History   Chief Complaint Chief Complaint  Patient presents with  . Eye Problem   The history is provided by the patient. No language interpreter was used.   HPI Comments: Brianna Byrd is a 20 y.o. female who presents to the Emergency Department complaining of continued left eye discharge onset 3 days ago. She reports associated eye redness and swelling. Pt was seen on 01/28/16 in the ED for the same and prescribed erythromycin ointment. Pt says she has been using the ointment every 4 hours for her symptoms to no relief. She has not followed up with an ophthalmologist. She does not wear glasses or contacts. She denies fever, chills, eye pain, or photophobia. 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 Asthma   . Reflux     There are no active problems to display for this patient.   History reviewed. No pertinent surgical history.  OB History    No data available       Home Medications    Prior to Admission medications   Medication Sig Start Date End Date Taking? Authorizing Provider  erythromycin ophthalmic ointment Place a 1/2 inch ribbon of ointment into the lower eyelid 6 times per day for 5 days 01/28/16   Eyvonne MechanicJeffrey Hedges, PA-C  methocarbamol (ROBAXIN) 500 MG tablet Take 1 tablet (500 mg total) by mouth 2 (two) times daily. 01/08/16   Audry Piliyler Mohr, PA-C  naproxen (NAPROSYN) 500 MG tablet Take 1 tablet (500 mg total) by mouth 2 (two) times daily. 01/08/16   Audry Piliyler Mohr, PA-C    Family History History reviewed. No pertinent family  history.  Social History Social History  Substance Use Topics  . Smoking status: Never Smoker  . Smokeless tobacco: Never Used  . Alcohol use No     Allergies   Peach [prunus persica]   Review of Systems Review of Systems  Constitutional: Negative for chills and fever.  HENT: Positive for facial swelling.   Eyes: Positive for discharge and redness. Negative for photophobia and pain.  All other systems reviewed and are negative.  Physical Exam Updated Vital Signs BP 110/78 (BP Location: Right Arm)   Pulse 100   Temp 98.3 F (36.8 C) (Oral)   Resp 18   Ht 5\' 2"  (1.575 m)   Wt 194 lb (88 kg)   LMP 12/12/2015   SpO2 100%   BMI 35.48 kg/m   Physical Exam  Constitutional: She is oriented to person, place, and time. She appears well-developed and well-nourished.  HENT:  Head: Normocephalic and atraumatic.  Eyes: EOM are normal. Pupils are equal, round, and reactive to light. Left eye exhibits discharge. Left conjunctiva is injected.  Watery discharge noted to left eye  Cardiovascular: Normal rate and regular rhythm.   Pulmonary/Chest: Effort normal and breath sounds normal.  Musculoskeletal: Normal range of motion.  Neurological: She is alert and oriented to person, place, and time.  Skin: Skin is warm and dry.  Psychiatric: She has a normal mood and affect. Her behavior is normal.  Nursing note and vitals reviewed.    ED Treatments /  Results  DIAGNOSTIC STUDIES: Oxygen Saturation is 100% on RA, normal by my interpretation.    COORDINATION OF CARE: 4:17 PM-Discussed treatment plan which includes follow up with eye doctor with pt at bedside and pt agreed to plan.     Labs (all labs ordered are listed, but only abnormal results are displayed) Labs Reviewed - No data to display  EKG  EKG Interpretation None       Radiology No results found.  Procedures Procedures (including critical care time)  Medications Ordered in ED Medications - No data to  display   Initial Impression / Assessment and Plan / ED Course  I have reviewed the triage vital signs and the nursing notes.  Pertinent labs & imaging results that were available during my care of the patient were reviewed by me and considered in my medical decision making (see chart for details).  Clinical Course      Pt dx likely conjunctivitis based on presentation & eye exam  No evidence of HSV or VSV infection. Pt is not a contact lens wearer.  Exam non-concerning for orbital cellulitis, hyphema, corneal ulcers, corneal abrasions or trauma.  Pati Patient has been instructed to use cool compresses and practice personal hygiene with frequent hand washing.  Patient understands to follow up with ophthalmology, especially if new symptoms including change in vision, purulent drainage, or entrapment occur.  Patient will be placed on Vigamox and referred to Ophthlmology  Final Clinical Impressions(s) / ED Diagnoses   Final diagnoses:  None  I personally performed the services described in this documentation, which was scribed in my presence. The recorded information has been reviewed and is accurate.  New Prescriptions New Prescriptions   No medications on file     Charlestine NightChristopher Makenze Ellett, PA-C 01/30/16 1649    Rolan BuccoMelanie Belfi, MD 01/31/16 [phone]

## 2016-02-21 ENCOUNTER — Encounter (HOSPITAL_BASED_OUTPATIENT_CLINIC_OR_DEPARTMENT_OTHER): Payer: Self-pay | Admitting: Emergency Medicine

## 2016-02-21 ENCOUNTER — Emergency Department (HOSPITAL_BASED_OUTPATIENT_CLINIC_OR_DEPARTMENT_OTHER)
Admission: EM | Admit: 2016-02-21 | Discharge: 2016-02-21 | Disposition: A | Discharge status: Home / Self Care | Payer: Self-pay | Attending: Dermatology | Admitting: Dermatology

## 2016-02-21 DIAGNOSIS — R05 Cough: Secondary | ICD-10-CM | POA: Insufficient documentation

## 2016-02-21 DIAGNOSIS — J45909 Unspecified asthma, uncomplicated: Secondary | ICD-10-CM | POA: Insufficient documentation

## 2016-02-21 DIAGNOSIS — Z5321 Procedure and treatment not carried out due to patient leaving prior to being seen by health care provider: Secondary | ICD-10-CM | POA: Insufficient documentation

## 2016-02-21 NOTE — ED Triage Notes (Signed)
Patient states that she wants to know if she can take something for her cough

## 2016-02-29 ENCOUNTER — Emergency Department (HOSPITAL_BASED_OUTPATIENT_CLINIC_OR_DEPARTMENT_OTHER)
Admission: EM | Admit: 2016-02-29 | Discharge: 2016-02-29 | Disposition: A | Discharge status: Home / Self Care | Payer: Self-pay | Attending: Emergency Medicine | Admitting: Emergency Medicine

## 2016-02-29 ENCOUNTER — Encounter (HOSPITAL_BASED_OUTPATIENT_CLINIC_OR_DEPARTMENT_OTHER): Payer: Self-pay | Admitting: Emergency Medicine

## 2016-02-29 DIAGNOSIS — Y939 Activity, unspecified: Secondary | ICD-10-CM | POA: Insufficient documentation

## 2016-02-29 DIAGNOSIS — J069 Acute upper respiratory infection, unspecified: Secondary | ICD-10-CM | POA: Insufficient documentation

## 2016-02-29 DIAGNOSIS — J45909 Unspecified asthma, uncomplicated: Secondary | ICD-10-CM | POA: Insufficient documentation

## 2016-02-29 DIAGNOSIS — Y999 Unspecified external cause status: Secondary | ICD-10-CM | POA: Insufficient documentation

## 2016-02-29 DIAGNOSIS — S0343XA Sprain of jaw, bilateral, initial encounter: Secondary | ICD-10-CM | POA: Insufficient documentation

## 2016-02-29 DIAGNOSIS — X58XXXA Exposure to other specified factors, initial encounter: Secondary | ICD-10-CM | POA: Insufficient documentation

## 2016-02-29 DIAGNOSIS — Y929 Unspecified place or not applicable: Secondary | ICD-10-CM | POA: Insufficient documentation

## 2016-02-29 DIAGNOSIS — S0340XA Sprain of jaw, unspecified side, initial encounter: Secondary | ICD-10-CM

## 2016-02-29 NOTE — ED Triage Notes (Signed)
Patient here on 12/5 for similar complaint, LWBS at that time. Patient c/o head congestion, sore throat, and feeling like her face is tight. Patient denies fever. Patient has been taking theraflu, mucnex, alkaseltzer plus without relief. Patient denies any productive cough.

## 2016-02-29 NOTE — Discharge Instructions (Signed)
Take tylenol 2 pills 4 times a day and motrin 4 pills 3 times a day.  Drink plenty of fluids.  Return for worsening shortness of breath, headache, confusion. Follow up with your family doctor.   

## 2016-02-29 NOTE — ED Provider Notes (Signed)
[facility]   CSN: [id number] Arrival date & time: 02/29/16  2022 By signing my name below, I, Levon HedgerElizabeth Hall, attest that this documentation has been prepared under the direction and in the presence of Melene Planan Teague Goynes, DO . Electronically Signed: Levon HedgerElizabeth Hall, Scribe. 02/29/2016. 9:43 PM.   History   Chief Complaint Chief Complaint  Patient presents with  . Sinusitis    HPI Brianna Byrd is a 20 y.o. female who presents to the Emergency Department complaining of sinus pain onset last week. Pt describes her pain as tightness and rates it 7/10. She notes associated sore throat, congestion and postnasal drip. She has taken Theraflu, mucinex and alka seltzer plus with no relief. 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 Pt denies any fever or other associated symptoms.   The history is provided by the patient. No language interpreter was used.   Past Medical History:  Diagnosis Date  . Asthma   . Reflux     There are no active problems to display for this patient.   History reviewed. No pertinent surgical history.  OB History    No data available       Home Medications    Prior to Admission medications   Medication Sig Start Date End Date Taking? Authorizing Provider  ciprofloxacin (CILOXAN) 0.3 % ophthalmic solution Place 2 drops into the left eye every 2 (two) hours. Administer 1 drop, every 2 hours, while awake, for 2 days. Then 1 drop, every 4 hours, while awake, for the next 5 days. 01/30/16   Charlestine Nighthristopher Lawyer, PA-C  erythromycin ophthalmic ointment Place a 1/2 inch ribbon of ointment into the lower eyelid 6 times per day for 5 days 01/28/16   Eyvonne MechanicJeffrey Hedges, PA-C  methocarbamol (ROBAXIN) 500 MG tablet Take 1 tablet (500 mg total) by mouth 2 (two) times daily. 01/08/16   Audry Piliyler Mohr, PA-C  naproxen (NAPROSYN) 500 MG tablet Take 1 tablet (500 mg total) by mouth 2 (two)  times daily. 01/08/16   Audry Piliyler Mohr, PA-C    Family History History reviewed. No pertinent family history.  Social History Social History  Substance Use Topics  . Smoking status: Never Smoker  . Smokeless tobacco: Never Used  . Alcohol use No    Allergies   Peach [prunus persica]   Review of Systems Review of Systems  Constitutional: Negative for chills and fever.  HENT: Positive for congestion, postnasal drip, sinus pressure, sore throat and tinnitus. Negative for rhinorrhea.   Eyes: Negative for redness and visual disturbance.  Respiratory: Negative for shortness of breath and wheezing.   Cardiovascular: Negative for chest pain and palpitations.  Gastrointestinal: Negative for nausea and vomiting.  Genitourinary: Negative for dysuria and urgency.  Musculoskeletal: Negative for arthralgias and myalgias.  Skin: Negative for pallor and wound.  Neurological: Negative for dizziness and headaches.   Physical Exam Updated Vital Signs BP 118/76 (BP Location: Left Arm)   Pulse 88   Temp 98 F (36.7 C) (Oral)   Resp 18   Ht 5\' 1"  (1.549 m)   Wt 194 lb (88 kg)   LMP 02/01/2016 (Approximate)   SpO2 100%   BMI 36.66 kg/m   Physical Exam  Constitutional: She is oriented to person, place, and time. She appears well-developed and well-nourished. No distress.  HENT:  Head: Normocephalic and atraumatic.  Right Ear: Tympanic membrane normal.  Left Ear: Tympanic membrane normal.  Mouth/Throat:  No oropharyngeal exudate, posterior oropharyngeal edema or posterior oropharyngeal erythema.  Swollen turbinates, postnasal drip.  Eyes: EOM are normal. Pupils are equal, round, and reactive to light.  Neck: Normal range of motion. Neck supple.  Cardiovascular: Normal rate and regular rhythm.  Exam reveals no gallop and no friction rub.   No murmur heard. Pulmonary/Chest: Effort normal. She has no wheezes. She has no rales.  Abdominal: Soft. She exhibits no distension. There is no  tenderness.  Musculoskeletal: She exhibits no edema or tenderness.  Neurological: She is alert and oriented to person, place, and time.  Skin: Skin is warm and dry. She is not diaphoretic.  Psychiatric: She has a normal mood and affect. Her behavior is normal.  Nursing note and vitals reviewed.  ED Treatments / Results  DIAGNOSTIC STUDIES:  Oxygen Saturation is 100% on RA, normal by my interpretation.    COORDINATION OF CARE:  9:43 PM Discussed treatment plan with pt at bedside and pt agreed to plan.  Labs (all labs ordered are listed, but only abnormal results are displayed) Labs Reviewed - No data to display  EKG  EKG Interpretation None       Radiology No results found.  Procedures Procedures (including critical care time)  Medications Ordered in ED Medications - No data to display   Initial Impression / Assessment and Plan / ED Course  I have reviewed the triage vital signs and the nursing notes.  Pertinent labs & imaging results that were available during my care of the patient were reviewed by me and considered in my medical decision making (see chart for details).  Clinical Course     20 yo F With a URI. Going on for the past couple days. Now having some pain below the jaw and up to the angle of the jaw. Patient has tenderness about bilateral TMJs. Will treat symptomatically. No signs of bacterial infection.  11:11 PM:  I have discussed the diagnosis/risks/treatment options with the patient and family and believe the pt to be eligible for discharge home to follow-up with PCP. We also discussed returning to the ED immediately if new or worsening sx occur. We discussed the sx which are most concerning (e.g., sudden worsening pain, fever, inability to tolerate by mouth) that necessitate immediate return. Medications administered to the patient during their visit and any new prescriptions provided to the patient are listed below.  Medications given during this  visit Medications - No data to display   The patient appears reasonably screen and/or stabilized for discharge and I doubt any other medical condition or other [facility] requiring further screening, evaluation, or treatment in the ED at this time prior to discharge.    Final Clinical Impressions(s) / ED Diagnoses   Final diagnoses:  TMJ (sprain of temporomandibular joint), initial encounter  Upper respiratory tract infection, unspecified type    New Prescriptions Discharge Medication List as of 02/29/2016  9:49 PM     I personally performed the services described in this documentation, which was scribed in my presence. The recorded information has been reviewed and is accurate.     Melene Planan Jeneva Schweizer, DO 02/29/16 2311

## 2016-03-19 NOTE — L&D Delivery Note (Signed)
Patient is a 21 y.o. now G1P1001 s/p NSVD at [redacted]w[redacted]d, who was admitted for IOL due to Pre-E without severe features..  Delivery Note At 8:36 AM a viable female was delivered via Vaginal, Spontaneous Delivery (Presentation: OA;.  APGAR: 9, 9; weight pending .   Placenta status: intact, . 3-vessel Cord:  with the following complications: 2nd degree laceration .  Cord pH: pending  Anesthesia: none Episiotomy: None Lacerations: 2nd degree Suture Repair: 3.0 vicryl Est. Blood Loss (mL):  250  Mom to postpartum.  Baby to Couplet care / Skin to Skin.    Head delivered OA. With nuchal cord present. Shoulder and body delivered in usual fashion. Infant with spontaneous cry, placed on mother's abdomen, dried and bulb suctioned. Cord clamped x 2 after 1-minute delay, and cut by family member. Cord blood drawn. Placenta delivered spontaneously with gentle cord traction. Fundus firm with massage and Pitocin. Perineum inspected and found to have 2nd laceration, which was repaired with 3-0 vicryl with good hemostasis achieved.  [employer], DO 12/26/16, 9:11 AM

## 2016-04-27 ENCOUNTER — Encounter (HOSPITAL_BASED_OUTPATIENT_CLINIC_OR_DEPARTMENT_OTHER): Payer: Self-pay

## 2016-04-27 ENCOUNTER — Emergency Department (HOSPITAL_BASED_OUTPATIENT_CLINIC_OR_DEPARTMENT_OTHER)
Admission: EM | Admit: 2016-04-27 | Discharge: 2016-04-27 | Disposition: A | Discharge status: Home / Self Care | Payer: Medicaid Other | Attending: Emergency Medicine | Admitting: Emergency Medicine

## 2016-04-27 DIAGNOSIS — O2341 Unspecified infection of urinary tract in pregnancy, first trimester: Secondary | ICD-10-CM | POA: Diagnosis present

## 2016-04-27 DIAGNOSIS — Z791 Long term (current) use of non-steroidal anti-inflammatories (NSAID): Secondary | ICD-10-CM | POA: Insufficient documentation

## 2016-04-27 DIAGNOSIS — R8271 Bacteriuria: Secondary | ICD-10-CM

## 2016-04-27 DIAGNOSIS — J45909 Unspecified asthma, uncomplicated: Secondary | ICD-10-CM | POA: Insufficient documentation

## 2016-04-27 DIAGNOSIS — Z3A01 Less than 8 weeks gestation of pregnancy: Secondary | ICD-10-CM

## 2016-04-27 LAB — URINALYSIS, ROUTINE W REFLEX MICROSCOPIC
Bilirubin Urine: NEGATIVE
Glucose, UA: NEGATIVE mg/dL
Hgb urine dipstick: NEGATIVE
Ketones, ur: NEGATIVE mg/dL
Nitrite: NEGATIVE
Protein, ur: NEGATIVE mg/dL
Specific Gravity, Urine: 1.026 (ref 1.005–1.030)
pH: 8 (ref 5.0–8.0)

## 2016-04-27 LAB — URINALYSIS, MICROSCOPIC (REFLEX)

## 2016-04-27 LAB — PREGNANCY, URINE: Preg Test, Ur: POSITIVE — AB

## 2016-04-27 MED ORDER — NITROFURANTOIN MONOHYD MACRO 100 MG PO CAPS
100.0000 mg | ORAL_CAPSULE | Freq: Once | ORAL | Status: AC
  Administered 2016-04-27: 100 mg via ORAL
  Filled 2016-04-27: qty 1

## 2016-04-27 MED ORDER — NITROFURANTOIN MONOHYD MACRO 100 MG PO CAPS: 100.0000 mg | ORAL_CAPSULE | Freq: Two times a day (BID) | ORAL | 0 refills | Status: DC

## 2016-04-27 NOTE — ED Provider Notes (Signed)
[facility]   CSN: [id number] Arrival date & time: 04/27/16  0422     History   Chief Complaint Chief Complaint  Patient presents with  . ?pregnant     HPI Brianna Basilia JumboCovington is a 21 y.o. female.  The history is provided by the patient.  Possible Pregnancy  This is a new problem. The current episode started 6 to 12 hours ago. The problem occurs constantly. The problem has not changed since onset.Pertinent negatives include no chest pain, no abdominal pain, no headaches and no shortness of breath. Nothing aggravates the symptoms. Nothing relieves the symptoms. She has tried nothing for the symptoms. The treatment provided no relief.  Patient is not having bleeding or abdominal pain. No discharge. She took a home pregnancy test and simply came in to confirm the positive results.  No f/c/r.  Period in January was on time none in February.  Has breast tenderness.    Past Medical History:  Diagnosis Date  . Asthma   . Reflux     There are no active problems to display for this patient.   History reviewed. No pertinent surgical history.  OB History    No data available       Home Medications    Prior to Admission medications   Medication Sig Start Date End Date Taking? Authorizing Provider  ciprofloxacin (CILOXAN) 0.3 % ophthalmic solution Place 2 drops into the left eye every 2 (two) hours. Administer 1 drop, every 2 hours, while awake, for 2 days. Then 1 drop, every 4 hours, while awake, for the next 5 days. 01/30/16   Charlestine Nighthristopher Lawyer, PA-C  erythromycin ophthalmic ointment Place a 1/2 inch ribbon of ointment into the lower eyelid 6 times per day for 5 days 01/28/16   Eyvonne MechanicJeffrey Hedges, PA-C  methocarbamol (ROBAXIN) 500 MG tablet Take 1 tablet (500 mg total) by mouth 2 (two) times daily. 01/08/16   Audry Piliyler Mohr, PA-C  naproxen (NAPROSYN) 500 MG tablet Take 1 tablet (500 mg total) by mouth 2 (two) times daily. 01/08/16   Audry Piliyler Mohr, PA-C    nitrofurantoin, macrocrystal-monohydrate, (MACROBID) 100 MG capsule Take 1 capsule (100 mg total) by mouth 2 (two) times daily. X 7 days 04/27/16   Geneve Kimpel, MD    Family History No family history on file.  Social History Social History  Substance Use Topics  . Smoking status: Never Smoker  . Smokeless tobacco: Never Used  . Alcohol use No     Allergies   Peach [prunus persica]   Review of Systems Review of Systems  Constitutional: Negative for fever.  Respiratory: Negative for shortness of breath.   Cardiovascular: Negative for chest pain.  Gastrointestinal: Negative for abdominal pain, anal bleeding and vomiting.  Genitourinary: Negative for flank pain, pelvic pain, vaginal bleeding, vaginal discharge and vaginal pain.  Neurological: Negative for headaches.  All other systems reviewed and are negative.    Physical Exam Updated Vital Signs BP 119/75 (BP Location: Right Arm)   Pulse 94   Temp 98.8 F (37.1 C) (Oral)   Resp 18   LMP 03/20/2016   SpO2 100%   Physical Exam  Constitutional: She is oriented to person, place, and time. She appears well-developed and well-nourished. No distress.  HENT:  Head: Normocephalic and atraumatic.  Mouth/Throat: No oropharyngeal exudate.  Eyes: Conjunctivae and EOM are normal.  Neck: Normal range of motion. Neck supple.  Cardiovascular: Normal rate, regular rhythm and intact distal pulses.   Pulmonary/Chest: Effort normal  and breath sounds normal. She has no wheezes. She has no rales.  Abdominal: Soft. Bowel sounds are normal. She exhibits no mass. There is no tenderness. There is no rebound and no guarding.  Musculoskeletal: Normal range of motion.  Neurological: She is alert and oriented to person, place, and time.  Skin: Skin is warm and dry. Capillary refill takes less than 2 seconds.  Psychiatric: She has a normal mood and affect.     ED Treatments / Results   Vitals:   04/27/16 0433  BP: 119/75  Pulse: 94   Resp: 18  Temp: 98.8 F (37.1 C)   Results for orders placed or performed during the hospital encounter of 04/27/16  Pregnancy, urine  Result Value Ref Range   Preg Test, Ur POSITIVE (A) NEGATIVE  Urinalysis, Routine w reflex microscopic  Result Value Ref Range   Color, Urine YELLOW YELLOW   APPearance CLEAR CLEAR   Specific Gravity, Urine 1.026 1.005 - 1.030   pH 8.0 5.0 - 8.0   Glucose, UA NEGATIVE NEGATIVE mg/dL   Hgb urine dipstick NEGATIVE NEGATIVE   Bilirubin Urine NEGATIVE NEGATIVE   Ketones, ur NEGATIVE NEGATIVE mg/dL   Protein, ur NEGATIVE NEGATIVE mg/dL   Nitrite NEGATIVE NEGATIVE   Leukocytes, UA MODERATE (A) NEGATIVE  Urinalysis, Microscopic (reflex)  Result Value Ref Range   RBC / HPF 0-5 0 - 5 RBC/hpf   WBC, UA TOO NUMEROUS TO COUNT 0 - 5 WBC/hpf   Bacteria, UA MANY (A) NONE SEEN   Squamous Epithelial / LPF 0-5 (A) NONE SEEN   Mucous PRESENT    Sperm, UA PRESENT    No results found.  Radiology No results found.  Procedures Procedures (including critical care time)  Medications Ordered in ED Medications  nitrofurantoin (macrocrystal-monohydrate) (MACROBID) capsule 100 mg (100 mg Oral Given 04/27/16 0517)     Final Clinical Impressions(s) / ED Diagnoses  Pregnancy and asymptomatic bacteruria.  Follow up with your GYN or the county health department for ongoing care of your pregnancy.  Take all antibiotics return for bleeding pain weakness or any concerns.  All questions answered to patient's satisfaction. Based on history and exam patient has been appropriately medically screened and emergency conditions excluded. Patient is stable for discharge at this time. Strict return precautions given for any further episodes, persistent fever, weakness or any concerns. New Prescriptions New Prescriptions   NITROFURANTOIN, MACROCRYSTAL-MONOHYDRATE, (MACROBID) 100 MG CAPSULE    Take 1 capsule (100 mg total) by mouth 2 (two) times daily. X 7 days     Kyilee Gregg,  MD 04/27/16 [phone]

## 2016-04-27 NOTE — ED Triage Notes (Signed)
Pt c/o breast tenderness and abdominal cramping last week and a low grade fever tonight. States had a positive preg test but wanted to make sure

## 2016-05-14 DIAGNOSIS — E3431 Constitutional short stature: Secondary | ICD-10-CM | POA: Insufficient documentation

## 2016-05-14 DIAGNOSIS — E343 Short stature due to endocrine disorder: Secondary | ICD-10-CM | POA: Insufficient documentation

## 2016-05-14 DIAGNOSIS — Z3490 Encounter for supervision of normal pregnancy, unspecified, unspecified trimester: Secondary | ICD-10-CM

## 2016-06-05 DIAGNOSIS — A5901 Trichomonal vulvovaginitis: Secondary | ICD-10-CM | POA: Insufficient documentation

## 2016-06-08 ENCOUNTER — Encounter (HOSPITAL_BASED_OUTPATIENT_CLINIC_OR_DEPARTMENT_OTHER): Payer: Self-pay | Admitting: Emergency Medicine

## 2016-06-08 ENCOUNTER — Emergency Department (HOSPITAL_BASED_OUTPATIENT_CLINIC_OR_DEPARTMENT_OTHER)
Admission: EM | Admit: 2016-06-08 | Discharge: 2016-06-08 | Disposition: A | Discharge status: Home / Self Care | Payer: Medicaid Other | Attending: Emergency Medicine | Admitting: Emergency Medicine

## 2016-06-08 DIAGNOSIS — J028 Acute pharyngitis due to other specified organisms: Secondary | ICD-10-CM | POA: Diagnosis not present

## 2016-06-08 DIAGNOSIS — J029 Acute pharyngitis, unspecified: Secondary | ICD-10-CM | POA: Diagnosis present

## 2016-06-08 DIAGNOSIS — J45909 Unspecified asthma, uncomplicated: Secondary | ICD-10-CM | POA: Diagnosis not present

## 2016-06-08 DIAGNOSIS — B9689 Other specified bacterial agents as the cause of diseases classified elsewhere: Secondary | ICD-10-CM | POA: Diagnosis not present

## 2016-06-08 LAB — RAPID STREP SCREEN (MED CTR MEBANE ONLY): Streptococcus, Group A Screen (Direct): NEGATIVE

## 2016-06-08 MED ORDER — AMOXICILLIN 400 MG/5ML PO SUSR: 500.0000 mg | Freq: Three times a day (TID) | ORAL | 0 refills | Status: AC

## 2016-06-08 NOTE — ED Triage Notes (Signed)
Patient states that she has had a sore throat and coughing up mucous all day. Reports that she had a fever earlier. Patient states that the OTC medications and cough drop shave not helped and she is unable to eat or swallow. The patient reports that she is [redacted] weeks pregnant

## 2016-06-08 NOTE — ED Provider Notes (Signed)
[facility]   CSN: [id number] Arrival date & time: 06/08/16  0014     History   Chief Complaint Chief Complaint  Patient presents with  . Sore Throat    HPI Brianna Byrd is a 21 y.o. female.  HPI Patient presents with sore throat over the past 24 hours with painful swallowing and low-grade fevers.  She feels like she has lymphadenopathy of her neck.  No dental pain.  No difficulty breathing or swallowing just somewhat painful swallowing.  She is [redacted] weeks pregnant.  No shortness of breath.  Some mild productive cough.  Symptoms are mild to moderate in severity   Past Medical History:  Diagnosis Date  . Asthma   . Reflux     There are no active problems to display for this patient.   History reviewed. No pertinent surgical history.  OB History    Gravida Para Term Preterm AB Living   1             SAB TAB Ectopic Multiple Live Births                   Home Medications    Prior to Admission medications   Medication Sig Start Date End Date Taking? Authorizing Provider  amoxicillin (AMOXIL) 400 MG/5ML suspension Take 6.3 mLs (500 mg total) by mouth 3 (three) times daily. 06/08/16 06/15/16  Azalia BilisKevin Thomes Burak, MD  ciprofloxacin (CILOXAN) 0.3 % ophthalmic solution Place 2 drops into the left eye every 2 (two) hours. Administer 1 drop, every 2 hours, while awake, for 2 days. Then 1 drop, every 4 hours, while awake, for the next 5 days. 01/30/16   Charlestine Nighthristopher Lawyer, PA-C  erythromycin ophthalmic ointment Place a 1/2 inch ribbon of ointment into the lower eyelid 6 times per day for 5 days 01/28/16   Eyvonne MechanicJeffrey Hedges, PA-C  methocarbamol (ROBAXIN) 500 MG tablet Take 1 tablet (500 mg total) by mouth 2 (two) times daily. 01/08/16   Audry Piliyler Mohr, PA-C  naproxen (NAPROSYN) 500 MG tablet Take 1 tablet (500 mg total) by mouth 2 (two) times daily. 01/08/16   Audry Piliyler Mohr, PA-C  nitrofurantoin, macrocrystal-monohydrate, (MACROBID) 100 MG capsule Take 1 capsule (100  mg total) by mouth 2 (two) times daily. X 7 days 04/27/16   April Palumbo, MD    Family History History reviewed. No pertinent family history.  Social History Social History  Substance Use Topics  . Smoking status: Never Smoker  . Smokeless tobacco: Never Used  . Alcohol use No     Allergies   Peach [prunus persica]   Review of Systems Review of Systems  All other systems reviewed and are negative.    Physical Exam Updated Vital Signs BP 131/70 (BP Location: Right Arm)   Pulse (!) 113   Temp 99.8 F (37.7 C) (Oral)   Resp 18   Ht 4\' 11"  (1.499 m)   Wt 192 lb (87.1 kg)   LMP 03/20/2016   SpO2 100%   BMI 38.78 kg/m   Physical Exam  Constitutional: She is oriented to person, place, and time. She appears well-developed and well-nourished. No distress.  HENT:  Head: Normocephalic and atraumatic.  Erythema of the posterior pharynx with tonsillar swelling and exudate.  Uvula is midline.  Tolerating secretions.  Oral airway is patent.  No acute dental abnormalities noted  Eyes: EOM are normal.  Neck: Normal range of motion.  Cardiovascular: Normal rate and regular rhythm.   Pulmonary/Chest: Effort normal and breath  sounds normal.  Abdominal: Soft. She exhibits no distension. There is no tenderness.  Musculoskeletal: Normal range of motion.  Neurological: She is alert and oriented to person, place, and time.  Skin: Skin is warm and dry.  Psychiatric: She has a normal mood and affect. Judgment normal.  Nursing note and vitals reviewed.    ED Treatments / Results  Labs (all labs ordered are listed, but only abnormal results are displayed) Labs Reviewed  RAPID STREP SCREEN (NOT AT [facility])  CULTURE, GROUP A STREP [facility])    EKG  EKG Interpretation None       Radiology No results found.  Procedures Procedures (including critical care time)  Medications Ordered in ED Medications - No data to display   Initial Impression / Assessment and Plan / ED Course    I have reviewed the triage vital signs and the nursing notes.  Pertinent labs & imaging results that were available during my care of the patient were reviewed by me and considered in my medical decision making (see chart for details).     Treated for pharyngitis/tonsillitis.  Discharge home with amoxicillin.  Primary care follow-up.  She understands to return to the ER for new or worsening symptoms  Final Clinical Impressions(s) / ED Diagnoses   Final diagnoses:  Acute pharyngitis due to other specified organisms    New Prescriptions New Prescriptions   AMOXICILLIN (AMOXIL) 400 MG/5ML SUSPENSION    Take 6.3 mLs (500 mg total) by mouth 3 (three) times daily.     Azalia Bilis, MD 06/08/16 [phone]

## 2016-06-08 NOTE — ED Notes (Signed)
Pt verbalizes understanding of d/c instructions and denies any further need at this time. 

## 2016-06-10 LAB — CULTURE, GROUP A STREP (THRC)

## 2016-06-24 ENCOUNTER — Encounter (HOSPITAL_BASED_OUTPATIENT_CLINIC_OR_DEPARTMENT_OTHER): Payer: Self-pay | Admitting: *Deleted

## 2016-06-24 ENCOUNTER — Emergency Department (HOSPITAL_BASED_OUTPATIENT_CLINIC_OR_DEPARTMENT_OTHER)
Admission: EM | Admit: 2016-06-24 | Discharge: 2016-06-24 | Disposition: A | Discharge status: Home / Self Care | Payer: Medicaid Other | Attending: Emergency Medicine | Admitting: Emergency Medicine

## 2016-06-24 DIAGNOSIS — J45909 Unspecified asthma, uncomplicated: Secondary | ICD-10-CM | POA: Insufficient documentation

## 2016-06-24 DIAGNOSIS — Z3A12 12 weeks gestation of pregnancy: Secondary | ICD-10-CM | POA: Diagnosis not present

## 2016-06-24 DIAGNOSIS — O26891 Other specified pregnancy related conditions, first trimester: Secondary | ICD-10-CM | POA: Diagnosis present

## 2016-06-24 DIAGNOSIS — O98511 Other viral diseases complicating pregnancy, first trimester: Secondary | ICD-10-CM | POA: Insufficient documentation

## 2016-06-24 DIAGNOSIS — J029 Acute pharyngitis, unspecified: Secondary | ICD-10-CM | POA: Diagnosis not present

## 2016-06-24 DIAGNOSIS — Z79899 Other long term (current) drug therapy: Secondary | ICD-10-CM | POA: Diagnosis not present

## 2016-06-24 NOTE — Discharge Instructions (Signed)
Please read and follow all provided instructions.  Your diagnoses today include:  1. Viral pharyngitis     Tests performed today include: Vital signs. See below for your results today.   Medications prescribed:  Take as prescribed   Home care instructions:  Follow any educational materials contained in this packet.  Follow-up instructions: Please follow-up with your primary care provider for further evaluation of symptoms and treatment   Return instructions:  Please return to the Emergency Department if you do not get better, if you get worse, or new symptoms OR  - Fever (temperature greater than 101.59F)  - Bleeding that does not stop with holding pressure to the area    -Severe pain (please note that you may be more sore the day after your accident)  - Chest Pain  - Difficulty breathing  - Severe nausea or vomiting  - Inability to tolerate food and liquids  - Passing out  - Skin becoming red around your wounds  - Change in mental status (confusion or lethargy)  - New numbness or weakness    Please return if you have any other emergent concerns.  Additional Information:  Your vital signs today were: BP 117/78 (BP Location: Left Arm)    Pulse (!) 108    Temp 99 F (37.2 C) (Oral)    Resp 18    Ht  (1.549 m)    Wt 87.5 kg    LMP 03/20/2016    SpO2 100%    BMI 36.47 kg/m  If your blood pressure (BP) was elevated above 135/85 this visit, please have this repeated by your doctor within one month. ---------------

## 2016-06-24 NOTE — ED Notes (Signed)
FHT 140

## 2016-06-24 NOTE — ED Triage Notes (Signed)
Pt reports she's approx [redacted]wks pregnant and states she was treated for strep throat 2 wks ago. Present today with L jaw pain radiating into mouth and under tongue. Denies fever, n/v/d, sob.

## 2016-06-24 NOTE — ED Provider Notes (Signed)
[facility]   CSN: [id number] Arrival date & time: 06/24/16  1513     History   Chief Complaint Chief Complaint  Patient presents with  . Jaw Pain    HPI Brianna Byrd is a 21 y.o. female.  HPI  21 y.o. female at 107wks gestation, presents to the Emergency Department today complaining of left sided jaw pain that began around noon today. Notes pain is isolated underneath chin as well as underneath tongue of left sid of soft tissue. Chart review shows recent visit for acute pharyngitis x 2 weeks ago that was treated with ABX. Rapid strep and culture were both negative. Denies fever currently. No N/V/D. Pt is able to tolerate PO, but states that there is mild discomfort. Notes no drainage from the area. No redness. No dental abnormalities. No pain with ROM of neck. No CP/SOB/ABD pain. No URI symptoms. No other symptoms noted.   Past Medical History:  Diagnosis Date  . Asthma   . Reflux     There are no active problems to display for this patient.   History reviewed. No pertinent surgical history.  OB History    Gravida Para Term Preterm AB Living   1             SAB TAB Ectopic Multiple Live Births                   Home Medications    Prior to Admission medications   Medication Sig Start Date End Date Taking? Authorizing Provider  AMOXICILLIN PO Take by mouth.   Yes Historical Provider, MD  Prenatal Vit-Fe Fumarate-FA (PRENATAL MULTIVITAMIN) TABS tablet Take 1 tablet by mouth daily at 12 noon.   Yes Historical Provider, MD  ciprofloxacin (CILOXAN) 0.3 % ophthalmic solution Place 2 drops into the left eye every 2 (two) hours. Administer 1 drop, every 2 hours, while awake, for 2 days. Then 1 drop, every 4 hours, while awake, for the next 5 days. 01/30/16   Charlestine Night, PA-C  erythromycin ophthalmic ointment Place a 1/2 inch ribbon of ointment into the lower eyelid 6 times per day for 5 days 01/28/16   Eyvonne Mechanic, PA-C    methocarbamol (ROBAXIN) 500 MG tablet Take 1 tablet (500 mg total) by mouth 2 (two) times daily. 01/08/16   Audry Pili, PA-C  naproxen (NAPROSYN) 500 MG tablet Take 1 tablet (500 mg total) by mouth 2 (two) times daily. 01/08/16   Audry Pili, PA-C  nitrofurantoin, macrocrystal-monohydrate, (MACROBID) 100 MG capsule Take 1 capsule (100 mg total) by mouth 2 (two) times daily. X 7 days 04/27/16   April Palumbo, MD    Family History No family history on file.  Social History Social History  Substance Use Topics  . Smoking status: Never Smoker  . Smokeless tobacco: Never Used  . Alcohol use No     Allergies   Peach [prunus persica]   Review of Systems Review of Systems  Constitutional: Negative for fever.  HENT: Negative for dental problem, rhinorrhea, sinus pain, sore throat and trouble swallowing.   Respiratory: Negative for cough.   Gastrointestinal: Negative for nausea and vomiting.   Physical Exam Updated Vital Signs BP 117/78 (BP Location: Left Arm)   Pulse (!) 108   Temp 99 F (37.2 C) (Oral)   Resp 18   Ht  (1.549 m)   Wt 87.5 kg   LMP 03/20/2016   SpO2 100%   BMI 36.47 kg/m   Physical  Exam  Constitutional: She is oriented to person, place, and time. Vital signs are normal. She appears well-developed and well-nourished.  HENT:  Head: Normocephalic and atraumatic.  Right Ear: Hearing normal.  Left Ear: Hearing normal.  Mouth/Throat: Uvula is midline, oropharynx is clear and moist and mucous membranes are normal. No trismus in the jaw. No dental abscesses or uvula swelling. No oropharyngeal exudate, posterior oropharyngeal edema, posterior oropharyngeal erythema or tonsillar abscesses.  No dental abscess. No trismus. No uvula deviation. TTP left sublingual papilla. No visible erythema or swelling. Noted tenderness along lymph node on left side without visible swelling or erythema.   Eyes: Conjunctivae and EOM are normal. Pupils are equal, round, and reactive to  light.  Neck: Normal range of motion. Neck supple.  Full ROM neck without pain  Cardiovascular: Normal rate, regular rhythm, normal heart sounds and intact distal pulses.   Pulmonary/Chest: Effort normal and breath sounds normal.  Abdominal: Soft. There is no tenderness.  Musculoskeletal: Normal range of motion.  Neurological: She is alert and oriented to person, place, and time.  Skin: Skin is warm and dry.  Psychiatric: She has a normal mood and affect. Her speech is normal and behavior is normal. Thought content normal.  Nursing note and vitals reviewed.  ED Treatments / Results  Labs (all labs ordered are listed, but only abnormal results are displayed) Labs Reviewed - No data to display  EKG  EKG Interpretation None       Radiology No results found.  Procedures Procedures (including critical care time)  Medications Ordered in ED Medications - No data to display   Initial Impression / Assessment and Plan / ED Course  I have reviewed the triage vital signs and the nursing notes.  Pertinent labs & imaging results that were available during my care of the patient were reviewed by me and considered in my medical decision making (see chart for details).  Final Clinical Impressions(s) / ED Diagnoses     {I have reviewed the relevant previous healthcare records.  {I obtained HPI from historian.   ED Course:  Assessment: Pt is a 21 y.o. female at [redacted]wks gestation, presents to the Emergency Department today complaining of left sided jaw pain that began around noon today. Chart review shows recent visit for acute pharyngitis x 2 weeks ago that was treated with ABX. Rapid strep and culture were both negative. Denies fever currently. No N/V/D. On exam, pt in NAD. Nontoxic/nonseptic appearing. VSS. Afebrile. Lungs CTA. Heart RRR. TTP below left sublingual papilla. No erythema or swelling. No obvious signs of infection suggestive of ludwigs angina. Discussed and seen by supervising  physician. Likely related to viral pharyngitis with swollen lymph nodes causing symptoms. Doubt ludwigs given presentation. Counseled patient to return if swelling arises or difficulty with PO intake. Plan is to DC home with follow up to PCP. At time of discharge, Patient is in no acute distress. Vital Signs are stable. Patient is able to ambulate. Patient able to tolerate PO.   Disposition/Plan:  DC Home Additional Verbal discharge instructions given and discussed with patient.  Pt Instructed to f/u with PCP in the next week for evaluation and treatment of symptoms. Return precautions given Pt acknowledges and agrees with plan  Supervising Physician Rolan Bucco, MD  Final diagnoses:  Viral pharyngitis    New Prescriptions New Prescriptions   No medications on file     Audry Pili, PA-C 06/24/16 1557    Rolan Bucco, MD 06/24/16 1620

## 2016-10-08 LAB — OB RESULTS CONSOLE RPR: RPR: NONREACTIVE

## 2016-10-08 LAB — OB RESULTS CONSOLE HIV ANTIBODY (ROUTINE TESTING): HIV: NONREACTIVE

## 2016-11-30 LAB — OB RESULTS CONSOLE GBS: GBS: POSITIVE

## 2016-12-21 DIAGNOSIS — F5104 Psychophysiologic insomnia: Secondary | ICD-10-CM | POA: Insufficient documentation

## 2016-12-25 ENCOUNTER — Encounter (HOSPITAL_COMMUNITY): Payer: Self-pay | Admitting: *Deleted

## 2016-12-25 ENCOUNTER — Inpatient Hospital Stay (HOSPITAL_COMMUNITY)
Admission: AD | Admit: 2016-12-25 | Discharge: 2016-12-28 | DRG: 807 | Disposition: A | Discharge status: Home / Self Care | Payer: Medicaid Other | Source: Ambulatory Visit | Attending: Family Medicine | Admitting: Family Medicine

## 2016-12-25 DIAGNOSIS — O1494 Unspecified pre-eclampsia, complicating childbirth: Secondary | ICD-10-CM | POA: Diagnosis present

## 2016-12-25 DIAGNOSIS — O9902 Anemia complicating childbirth: Secondary | ICD-10-CM | POA: Diagnosis present

## 2016-12-25 DIAGNOSIS — D509 Iron deficiency anemia, unspecified: Secondary | ICD-10-CM | POA: Diagnosis present

## 2016-12-25 DIAGNOSIS — O1414 Severe pre-eclampsia complicating childbirth: Secondary | ICD-10-CM | POA: Diagnosis not present

## 2016-12-25 DIAGNOSIS — O99824 Streptococcus B carrier state complicating childbirth: Secondary | ICD-10-CM | POA: Diagnosis present

## 2016-12-25 DIAGNOSIS — Z3490 Encounter for supervision of normal pregnancy, unspecified, unspecified trimester: Secondary | ICD-10-CM

## 2016-12-25 DIAGNOSIS — Z3A39 39 weeks gestation of pregnancy: Secondary | ICD-10-CM | POA: Diagnosis not present

## 2016-12-25 LAB — COMPREHENSIVE METABOLIC PANEL
ALT: 12 U/L — ABNORMAL LOW (ref 14–54)
AST: 19 U/L (ref 15–41)
Albumin: 2.8 g/dL — ABNORMAL LOW (ref 3.5–5.0)
Alkaline Phosphatase: 298 U/L — ABNORMAL HIGH (ref 38–126)
Anion gap: 8 (ref 5–15)
BUN: 6 mg/dL (ref 6–20)
CO2: 22 mmol/L (ref 22–32)
Calcium: 9.1 mg/dL (ref 8.9–10.3)
Chloride: 106 mmol/L (ref 101–111)
Creatinine, Ser: 0.69 mg/dL (ref 0.44–1.00)
GFR calc Af Amer: >60 mL/min (ref >60)
GFR calc non Af Amer: >60 mL/min (ref >60)
Glucose, Bld: 82 mg/dL (ref 65–99)
Potassium: 4.3 mmol/L (ref 3.5–5.1)
Sodium: 136 mmol/L (ref 135–145)
Total Bilirubin: 0.8 mg/dL (ref 0.3–1.2)
Total Protein: 7 g/dL (ref 6.5–8.1)

## 2016-12-25 LAB — TYPE AND SCREEN
ABO/RH(D): B POS
Antibody Screen: NEGATIVE

## 2016-12-25 LAB — PROTEIN / CREATININE RATIO, URINE
Creatinine, Urine: 105 mg/dL
Protein Creatinine Ratio: 0.62 mg/mg{Cre} — ABNORMAL HIGH (ref 0.00–0.15)
Total Protein, Urine: 65 mg/dL

## 2016-12-25 LAB — CBC
HCT: 32.3 % — ABNORMAL LOW (ref 36.0–46.0)
Hemoglobin: 10 g/dL — ABNORMAL LOW (ref 12.0–15.0)
MCH: 22.3 pg — ABNORMAL LOW (ref 26.0–34.0)
MCHC: 31 g/dL (ref 30.0–36.0)
MCV: 72.1 fL — ABNORMAL LOW (ref 78.0–100.0)
Platelets: 347 10*3/uL (ref 150–400)
RBC: 4.48 MIL/uL (ref 3.87–5.11)
RDW: 16.4 % — ABNORMAL HIGH (ref 11.5–15.5)
WBC: 9.1 10*3/uL (ref 4.0–10.5)

## 2016-12-25 MED ORDER — LACTATED RINGERS IV SOLN: 500.0000 mL | INTRAVENOUS | Status: DC | PRN

## 2016-12-25 MED ORDER — LABETALOL HCL 5 MG/ML IV SOLN: 20.0000 mg | INTRAVENOUS | Status: DC | PRN

## 2016-12-25 MED ORDER — ONDANSETRON HCL 4 MG/2ML IJ SOLN: 4.0000 mg | Freq: Four times a day (QID) | INTRAMUSCULAR | Status: DC | PRN

## 2016-12-25 MED ORDER — LACTATED RINGERS IV SOLN
INTRAVENOUS | Status: DC
  Administered 2016-12-25 – 2016-12-26 (×2): via INTRAVENOUS

## 2016-12-25 MED ORDER — OXYTOCIN BOLUS FROM INFUSION
500.0000 mL | Freq: Once | INTRAVENOUS | Status: AC
  Administered 2016-12-26: 500 mL via INTRAVENOUS

## 2016-12-25 MED ORDER — FLEET ENEMA 7-19 GM/118ML RE ENEM: 1.0000 | ENEMA | Freq: Every day | RECTAL | Status: DC | PRN

## 2016-12-25 MED ORDER — OXYTOCIN 40 UNITS IN LACTATED RINGERS INFUSION - SIMPLE MED
1.0000 m[IU]/min | INTRAVENOUS | Status: DC
  Administered 2016-12-25: 2 m[IU]/min via INTRAVENOUS
  Filled 2016-12-25: qty 1000

## 2016-12-25 MED ORDER — TERBUTALINE SULFATE 1 MG/ML IJ SOLN
0.2500 mg | Freq: Once | INTRAMUSCULAR | Status: DC | PRN
  Filled 2016-12-25: qty 1

## 2016-12-25 MED ORDER — SOD CITRATE-CITRIC ACID 500-334 MG/5ML PO SOLN: 30.0000 mL | ORAL | Status: DC | PRN

## 2016-12-25 MED ORDER — ACETAMINOPHEN 325 MG PO TABS: 650.0000 mg | ORAL_TABLET | ORAL | Status: DC | PRN

## 2016-12-25 MED ORDER — OXYTOCIN 40 UNITS IN LACTATED RINGERS INFUSION - SIMPLE MED: 2.5000 [IU]/h | INTRAVENOUS | Status: DC

## 2016-12-25 MED ORDER — LIDOCAINE HCL (PF) 1 % IJ SOLN
30.0000 mL | INTRAMUSCULAR | Status: AC | PRN
  Administered 2016-12-26: 30 mL via SUBCUTANEOUS
  Filled 2016-12-25: qty 30

## 2016-12-25 MED ORDER — OXYCODONE-ACETAMINOPHEN 5-325 MG PO TABS: 2.0000 | ORAL_TABLET | ORAL | Status: DC | PRN

## 2016-12-25 MED ORDER — FENTANYL CITRATE (PF) 100 MCG/2ML IJ SOLN
100.0000 ug | INTRAMUSCULAR | Status: DC | PRN
  Administered 2016-12-26 (×4): 100 ug via INTRAVENOUS
  Filled 2016-12-25 (×4): qty 2

## 2016-12-25 MED ORDER — PENICILLIN G POTASSIUM 5000000 UNITS IJ SOLR
5.0000 10*6.[IU] | Freq: Once | INTRAVENOUS | Status: AC
  Administered 2016-12-25: 5 10*6.[IU] via INTRAVENOUS
  Filled 2016-12-25: qty 5

## 2016-12-25 MED ORDER — OXYCODONE-ACETAMINOPHEN 5-325 MG PO TABS: 1.0000 | ORAL_TABLET | ORAL | Status: DC | PRN

## 2016-12-25 MED ORDER — PENICILLIN G POT IN DEXTROSE 60000 UNIT/ML IV SOLN
3.0000 10*6.[IU] | INTRAVENOUS | Status: DC
  Administered 2016-12-26 (×2): 3 10*6.[IU] via INTRAVENOUS
  Filled 2016-12-25 (×3): qty 50

## 2016-12-25 MED ORDER — HYDRALAZINE HCL 20 MG/ML IJ SOLN: 10.0000 mg | Freq: Once | INTRAMUSCULAR | Status: DC | PRN

## 2016-12-25 NOTE — H&P (Signed)
LABOR AND DELIVERY ADMISSION HISTORY AND PHYSICAL NOTE  Brianna Byrd is a 21 y.o. female G1P0 with IUP at [redacted]w[redacted]d by LMP presenting for IOL for preeclampsia. Patient has been receiving medical care at [facility]. She has presented to [facility] for contractions over the past few days and has been endorsing headache with floaters for the past week. She currently denies any CP, SOB, headache or changes in vision.  She presented to the MAU for contractions and was noted to have elevated pressures with high of 151/98. Labs were notable for protein/creatinine ratio 0.6 and mild microcytic anemia and she was transferred to birthing suites for IOL.   She reports positive fetal movement. She denies leakage of fluid or vaginal bleeding.  Prenatal History/Complications:  Past Medical History: Past Medical History:  Diagnosis Date  . Asthma   . Reflux     Past Surgical History: History reviewed. No pertinent surgical history.  Obstetrical History: OB History    Gravida Para Term Preterm AB Living   1             SAB TAB Ectopic Multiple Live Births                  Social History: Social History   Social History  . Marital status: Single    Spouse name: N/A  . Number of children: N/A  . Years of education: N/A   Social History Main Topics  . Smoking status: Never Smoker  . Smokeless tobacco: Never Used  . Alcohol use No  . Drug use: No  . Sexual activity: Yes    Birth control/ protection: None   Other Topics Concern  . None   Social History Narrative  . None    Family History: History reviewed. No pertinent family history.  Allergies: Allergies  Allergen Reactions  . Peach [Prunus Persica] Swelling    Prescriptions Prior to Admission  Medication Sig Dispense Refill Last Dose  . Prenatal Vit-Fe Fumarate-FA (PRENATAL MULTIVITAMIN) TABS tablet Take 1 tablet by mouth daily at 12 noon.   Past Week at Unknown time     Review of Systems   All systems  reviewed and negative except as stated in HPI  Blood pressure 131/87, pulse 80, temperature 98.6 F (37 C), resp. rate 16, height  (1.575 m), weight 98 kg (216 lb), last menstrual period 03/20/2016. General appearance: alert Lungs: clear to auscultation bilaterally Heart: regular rate and rhythm Abdomen: soft, non-tender; bowel sounds normal Extremities: No calf swelling or tenderness Presentation: cephalic Fetal monitoring: 145 BPM, mod variability, pos accels, no decels Uterine activity: contractions 4-6  Dilation: Closed Effacement (%): 100 Station: -1 Exam by:: K. Issabelle Mcraney,CNM   Prenatal labs: ABO, Rh: --/--/B POS (10/09 2027) Antibody: NEG (10/09 2027)  Rubella:  NA  RPR: Nonreactive (07/23 0000)  HBsAg:    NA  HIV: Non-reactive (07/23 0000)  GBS: Positive (09/14 0000) POS 1 hr Glucola: 113 Genetic screening:  normal Anatomy US: normal  Prenatal Transfer Tool  Maternal Diabetes: No Genetic Screening: Normal Maternal Ultrasounds/Referrals: Normal Fetal Ultrasounds or other Referrals:  None Maternal Substance Abuse:  No Significant Maternal Medications:  None Significant Maternal Lab Results: Lab values include: Group B Strep positive  Results for orders placed or performed during the hospital encounter of 12/25/16 (from the past 24 hour(s))  CBC   Collection Time: 12/25/16  8:27 PM  Result Value Ref Range   WBC 9.1 4.0 - 10.5 K/uL   RBC 4.48  3.87 - 5.11 MIL/uL   Hemoglobin 10.0 (L) 12.0 - 15.0 g/dL   HCT 16.1 (L) 09.6 - 04.5 %   MCV 72.1 (L) 78.0 - 100.0 fL   MCH 22.3 (L) 26.0 - 34.0 pg   MCHC 31.0 30.0 - 36.0 g/dL   RDW 40.9 (H) 81.1 - 91.4 %   Platelets 347 150 - 400 K/uL  Comprehensive metabolic panel   Collection Time: 12/25/16  8:27 PM  Result Value Ref Range   Sodium 136 135 - 145 mmol/L   Potassium 4.3 3.5 - 5.1 mmol/L   Chloride 106 101 - 111 mmol/L   CO2 22 22 - 32 mmol/L   Glucose, Bld 82 65 - 99 mg/dL   BUN 6 6 - 20 mg/dL   Creatinine,  Ser 7.82 0.44 - 1.00 mg/dL   Calcium 9.1 8.9 - 95.6 mg/dL   Total Protein 7.0 6.5 - 8.1 g/dL   Albumin 2.8 (L) 3.5 - 5.0 g/dL   AST 19 15 - 41 U/L   ALT 12 (L) 14 - 54 U/L   Alkaline Phosphatase 298 (H) 38 - 126 U/L   Total Bilirubin 0.8 0.3 - 1.2 mg/dL   GFR calc non Af Amer >60 >60 mL/min   GFR calc Af Amer >60 >60 mL/min   Anion gap 8 5 - 15  Type and screen [facility]   Collection Time: 12/25/16  8:27 PM  Result Value Ref Range   ABO/RH(D) B POS    Antibody Screen NEG    Sample Expiration 12/28/2016   Protein / creatinine ratio, urine   Collection Time: 12/25/16  8:40 PM  Result Value Ref Range   Creatinine, Urine 105.00 mg/dL   Total Protein, Urine 65 mg/dL   Protein Creatinine Ratio 0.62 (H) 0.00 - 0.15 mg/mg[Cre]    Patient Active Problem List   Diagnosis Date Noted  . Pregnancy 12/25/2016    Assessment: Brianna Byrd is a 21 y.o. G1P0 at [redacted]w[redacted]d here for IOL for preeclampsia also with headache with floaters x1 week that has since resolved. Currently no s/sx end organ damage. BP currently WNL. Protein/creatinine ratio to 0.6.  Cervix is soft and closed and patient reports intermittent contractions. Unable to obtain some pre-natal labs and will be reordering rubella IgG, HpBsAg. GBS positive and PCN ordered and will be started with active labor.  Currently comfortable and in NAD.   #Labor: IOL with pitocin 2x2, will reassess and attempt foley bulb when able #Pain: epidural #FWB: Cat 1 #ID: GBS pos, PCN with active labor #MOF: both #MOC: none #Circ:  NA  Renne Musca, MD PGY-2 12/25/2016, 11:54 PM   I spoke with and examined patient and agree with resident/PA/SNM's note and plan of care.  Has had intermittent headache x ~1wk, resolves w/ apap. Also has had intermittent periods of seeing spots x ~1wk. Currently denies any headache, visual changes, ruq/epigastric pain, n/v.  States her bp was elevated at last visit 140s/90s.  Reports  irregular uc's since last night, becoming more regular around 1600 today. Denies lof, vb. Reports cx has been checked in office and told she was anywhere from 0 to 2cm. Was checked by RN in MAU, ft/100/-2, vtx.  SVE by L&D RN: cl/100/-2, so I rechecked pt to determine poc: closed/100/-2, vtx so I don't feel she needs ripening at this time, and cx closed so unable to get foley bulb in, will try pitocin and monitor for need of change of poc.  Cat  1 FHR, uc's q 2-69mins, mild to palpation PCN for GBS+ P:C 0.62, other pre-e labs normal Will get ABO/RH, Rubella, Hep B as these were the only labs we were unable to find in [facility]where.  Cheral Marker, CNM, [facility] 12/25/2016 11:57 PM

## 2016-12-26 ENCOUNTER — Encounter (HOSPITAL_COMMUNITY): Payer: Self-pay | Admitting: *Deleted

## 2016-12-26 DIAGNOSIS — O1414 Severe pre-eclampsia complicating childbirth: Secondary | ICD-10-CM

## 2016-12-26 DIAGNOSIS — Z3A39 39 weeks gestation of pregnancy: Secondary | ICD-10-CM

## 2016-12-26 LAB — CBC
HCT: 33.4 % — ABNORMAL LOW (ref 36.0–46.0)
Hemoglobin: 10.6 g/dL — ABNORMAL LOW (ref 12.0–15.0)
MCH: 22.5 pg — ABNORMAL LOW (ref 26.0–34.0)
MCHC: 31.7 g/dL (ref 30.0–36.0)
MCV: 70.8 fL — ABNORMAL LOW (ref 78.0–100.0)
Platelets: 364 10*3/uL (ref 150–400)
RBC: 4.72 MIL/uL (ref 3.87–5.11)
RDW: 16.3 % — ABNORMAL HIGH (ref 11.5–15.5)
WBC: 13.1 10*3/uL — ABNORMAL HIGH (ref 4.0–10.5)

## 2016-12-26 LAB — ABO/RH: ABO/RH(D): B POS

## 2016-12-26 LAB — RPR: RPR Ser Ql: NONREACTIVE

## 2016-12-26 LAB — HEPATITIS B SURFACE ANTIGEN: Hepatitis B Surface Ag: NEGATIVE

## 2016-12-26 MED ORDER — DIPHENHYDRAMINE HCL 25 MG PO CAPS: 25.0000 mg | ORAL_CAPSULE | Freq: Four times a day (QID) | ORAL | Status: DC | PRN

## 2016-12-26 MED ORDER — IBUPROFEN 600 MG PO TABS: 600.0000 mg | ORAL_TABLET | Freq: Four times a day (QID) | ORAL | Status: DC

## 2016-12-26 MED ORDER — WITCH HAZEL-GLYCERIN EX PADS: 1.0000 "application " | MEDICATED_PAD | CUTANEOUS | Status: DC | PRN

## 2016-12-26 MED ORDER — ONDANSETRON HCL 4 MG PO TABS: 4.0000 mg | ORAL_TABLET | ORAL | Status: DC | PRN

## 2016-12-26 MED ORDER — PRENATAL MULTIVITAMIN CH
1.0000 | ORAL_TABLET | Freq: Every day | ORAL | Status: DC
  Filled 2016-12-26: qty 1

## 2016-12-26 MED ORDER — ZOLPIDEM TARTRATE 5 MG PO TABS: 5.0000 mg | ORAL_TABLET | Freq: Every evening | ORAL | Status: DC | PRN

## 2016-12-26 MED ORDER — COCONUT OIL OIL: 1.0000 "application " | TOPICAL_OIL | Status: DC | PRN

## 2016-12-26 MED ORDER — TETANUS-DIPHTH-ACELL PERTUSSIS 5-2.5-18.5 LF-MCG/0.5 IM SUSP: 0.5000 mL | Freq: Once | INTRAMUSCULAR | Status: DC

## 2016-12-26 MED ORDER — DIBUCAINE 1 % RE OINT: 1.0000 "application " | TOPICAL_OINTMENT | RECTAL | Status: DC | PRN

## 2016-12-26 MED ORDER — SENNOSIDES-DOCUSATE SODIUM 8.6-50 MG PO TABS: 2.0000 | ORAL_TABLET | ORAL | Status: DC

## 2016-12-26 MED ORDER — ACETAMINOPHEN 325 MG PO TABS: 650.0000 mg | ORAL_TABLET | ORAL | Status: DC | PRN

## 2016-12-26 MED ORDER — SIMETHICONE 80 MG PO CHEW: 80.0000 mg | CHEWABLE_TABLET | ORAL | Status: DC | PRN

## 2016-12-26 MED ORDER — MISOPROSTOL 25 MCG QUARTER TABLET
25.0000 ug | ORAL_TABLET | ORAL | Status: DC
  Administered 2016-12-26: 25 ug via VAGINAL
  Filled 2016-12-26 (×3): qty 1

## 2016-12-26 MED ORDER — COMPLETENATE 29-1 MG PO CHEW
1.0000 | CHEWABLE_TABLET | Freq: Every day | ORAL | Status: DC
  Administered 2016-12-26 – 2016-12-27 (×2): 1 via ORAL
  Filled 2016-12-26 (×3): qty 1

## 2016-12-26 MED ORDER — MISOPROSTOL 50MCG HALF TABLET: 50.0000 ug | ORAL_TABLET | ORAL | Status: DC

## 2016-12-26 MED ORDER — PRENATAL MULTIVITAMIN CH: 1.0000 | ORAL_TABLET | Freq: Every day | ORAL | Status: DC

## 2016-12-26 MED ORDER — ONDANSETRON HCL 4 MG/2ML IJ SOLN: 4.0000 mg | INTRAMUSCULAR | Status: DC | PRN

## 2016-12-26 MED ORDER — IBUPROFEN 100 MG/5ML PO SUSP
600.0000 mg | Freq: Four times a day (QID) | ORAL | Status: DC
  Administered 2016-12-26 – 2016-12-28 (×6): 600 mg via ORAL
  Filled 2016-12-26 (×8): qty 30

## 2016-12-26 MED ORDER — IBUPROFEN 600 MG PO TABS
600.0000 mg | ORAL_TABLET | Freq: Four times a day (QID) | ORAL | Status: DC
  Filled 2016-12-26: qty 1

## 2016-12-26 MED ORDER — BENZOCAINE-MENTHOL 20-0.5 % EX AERO
1.0000 "application " | INHALATION_SPRAY | CUTANEOUS | Status: DC | PRN
  Filled 2016-12-26 (×2): qty 56

## 2016-12-26 MED ORDER — SENNOSIDES-DOCUSATE SODIUM 8.6-50 MG PO TABS
2.0000 | ORAL_TABLET | ORAL | Status: DC
  Administered 2016-12-26: 2 via ORAL
  Filled 2016-12-26: qty 2

## 2016-12-26 MED ORDER — BENZOCAINE-MENTHOL 20-0.5 % EX AERO: 1.0000 "application " | INHALATION_SPRAY | CUTANEOUS | Status: DC | PRN

## 2016-12-26 NOTE — Progress Notes (Signed)
Patient ID: Brianna Byrd, female   DOB: 1996/01/15, 21 y.o.   MRN: [id number] Brianna Byrd is a 21 y.o. G1P0 at [redacted]w[redacted]d admitted for induction of labor due to pre-e w/o severe features.  Subjective: Becoming uncomfortable w/ uc's. Denies ha, visual changes, ruq/epigastric pain, n/v.    Objective: BP 127/62   Pulse 79   Temp 98.6 F (37 C)   Resp 16   Ht  (1.575 m)   Wt 98 kg (216 lb)   LMP 03/20/2016   BMI 39.51 kg/m  No intake/output data recorded.  FHT:  FHR: 150 bpm, variability: moderate,  accelerations:  Present,  decelerations:  Present occ variables UC:   regular, every 3 minutes  SVE:   Dilation: Closed Effacement (%): 100 Station: -1 Exam by:: K. Mordecai Tindol,CNM  Is difficult exam, cx thin, only able to feel a very small dimple, but not able to get finger through, so still calling her cl/100/-2, vtx confirmed by u/s. Denies ever having any procedures to cx  Pitocin @ 4 mu/min  Labs: Lab Results  Component Value Date   WBC 9.1 12/25/2016   HGB 10.0 (L) 12/25/2016   HCT 32.3 (L) 12/25/2016   MCV 72.1 (L) 12/25/2016   PLT 347 12/25/2016    Assessment / Plan: IOL d/t pre-e w/o severe features, continue pitocin, plan recheck of cx at 0500 or earlier if clinically indicated  Labor: early Fetal Wellbeing:  Category II Pain Control:  Labor support without medications Pre-eclampsia: w/o severe features, bp's stable, asymptomatic I/D:  PCN for GBS+ Anticipated MOD:  NSVD  Marge Duncans CNM, WHNP-BC 12/26/2016, 1:58 AM

## 2016-12-26 NOTE — Lactation Note (Signed)
This note was copied from a baby's chart. Lactation Consultation Note  Patient Name: Brianna Byrd Today's Date: 12/26/2016 Reason for consult: Initial assessment Baby at 9 hr of life. Mom stated she wanted to bf and formula feed. She stated to lactation she only wanted to pump to feed. Her plan is to wait until d/c to contact to [facility] to get a pump and start pumping. She declined hospital pump at this visit. Reviewed the risks of formula and artificail nipples. Reviewed up coming breast changes and comfort measures during evolution. Given lactation handouts. Aware of OP services and support group.    Maternal Data Has patient been taught Hand Expression?: No  Feeding Feeding Type: Bottle Fed - Formula  LATCH Score                   Interventions    Lactation Tools Discussed/Used     Consult Status Consult Status: Complete    Rulon Eisenmenger 12/26/2016, 6:05 PM

## 2016-12-26 NOTE — Progress Notes (Signed)
OB Interim Progress Note  S: Patient is comfortable and in NAD  O: BP (!) 118/48   Pulse 91   Temp 98.6 F (37 C)   Resp 18   Ht  (1.575 m)   Wt 98 kg (216 lb)   LMP 03/20/2016   BMI 39.51 kg/m    Dilation: Closed Effacement (%): 100 Cervical Position: Middle Station: -1 Presentation: Vertex Exam by:: Sade Harper,RN  FHT: 135 BPM, mod variability, pos accels, variable decels  A/P: Turned off pit and received first dose of cytotec . Will reassess for progress and attempt foley bulb placement when able.  Continue expectant management Anticipate SVD  Renne Musca, MD 12/26/2016, 4:39 AM PGY-2

## 2016-12-26 NOTE — Progress Notes (Signed)
Patient ID: Brianna Byrd, female   DOB: 12-02-1995, 20 y.o.   MRN: [id number] Discussed cx/poc w/ Dr. Adrian Blackwater, recommends switching to cytotec.  Cheral Marker, CNM, WHNP-BC 12/26/2016 3:16 AM

## 2016-12-26 NOTE — Progress Notes (Signed)
Patient ID: Brianna Byrd, female   DOB: Feb 10, 1996, 20 y.o.   MRN: [id number] Brianna Byrd is a 21 y.o. G1P0 at [redacted]w[redacted]d admitted for induction of labor due to pre-e w/o severe features.  Subjective: Uncomfortable w/ uc's, fentanyl no longer helping, wants epidural. Denies ha, visual changes, ruq/epigastric pain, n/v.    Objective: BP 118/85   Pulse 78   Temp 98.6 F (37 C)   Resp 16   Ht  (1.575 m)   Wt 98 kg (216 lb)   LMP 03/20/2016   BMI 39.51 kg/m  No intake/output data recorded.  FHT:  FHR: 150 bpm, variability: moderate,  accelerations:  Present,  decelerations:  Present variables, + scalp stim UC:  q 1-44mins  SVE:  1.5/100/0, vtx, BBOW, AROM scant fluid d/t head pressing tightly against cx  Labs: Lab Results  Component Value Date   WBC 9.1 12/25/2016   HGB 10.0 (L) 12/25/2016   HCT 32.3 (L) 12/25/2016   MCV 72.1 (L) 12/25/2016   PLT 347 12/25/2016    Assessment / Plan: IOL d/t pre-e w/o severe features, s/p pit for few hours, then switched to cytotec at 0330, only had 1 dose.   Labor: early Fetal Wellbeing:  Category II Pain Control:  requesting epidural Pre-eclampsia: bp's stable, asymptomatic I/D:  PCN for GBS+ Anticipated MOD:  NSVD  Brianna Byrd CNM, WHNP-BC 12/26/2016, 7:49 AM

## 2016-12-27 LAB — RUBELLA SCREEN: Rubella: 4.66 index (ref >0.99)

## 2016-12-27 NOTE — Discharge Instructions (Signed)

## 2016-12-27 NOTE — Lactation Note (Signed)
This note was copied from a baby's chart. Lactation Consultation Note  Patient Name: Brianna Byrd Today's Date: 12/27/2016   Visited with Mom, baby 82 hrs old.  Mom giving baby formula by bottle.  Offered to set up DEBP, but Mom declined.  Encouraged her to use hand expression to stimulate milk supply.  Offered to give her a spoon, but Mom declined.  To ask for help prn.   [name] 12/27/2016, 1:49 PM

## 2016-12-27 NOTE — Discharge Summary (Signed)
Obstetric Discharge Summary Reason for Admission: induction of labor Prenatal Procedures: Preeclampsia Intrapartum Procedures: spontaneous vaginal delivery Postpartum Procedures: none Complications-Operative and Postpartum: none  Patient is a 21 y.o. now G1P1001 s/p NSVD at [redacted]w[redacted]d, who was admitted for IOL due to Pre-E without severe features..  Delivery Note At 8:36 AM a viable female was delivered via Vaginal, Spontaneous Delivery (Presentation: OA;.  APGAR: 9, 9; weight pending .   Placenta status: intact, . 3-vessel Cord:  with the following complications: 2nd degree laceration .  Cord pH: pending  Anesthesia: none Episiotomy: None Lacerations: 2nd degree Suture Repair: 3.0 vicryl Est. Blood Loss (mL):  250  Mom to postpartum.  Baby to Couplet care / Skin to Skin.  Head delivered OA. With nuchal cord present. Shoulder and body delivered in usual fashion. Infant with spontaneous cry, placed on mother's abdomen, dried and bulb suctioned. Cord clamped x 2 after 1-minute delay, and cut by family member. Cord blood drawn. Placenta delivered spontaneously with gentle cord traction. Fundus firm with massage and Pitocin. Perineum inspected and found to have 2nd laceration, which was repaired with 3-0 vicryl with good hemostasis achieved.  Rolm Bookbinder, DO 12/26/16, 9:11 AM   Hospital Course:  Active Problems:   SVD (spontaneous vaginal delivery)   Brianna Byrd is a 21 y.o. G1P1001 s/p SVD.  Patient was admitted for IOL for preeclampsia without severe features.  She has postpartum course that was uncomplicated. The pt feels ready to go home and  will be discharged with outpatient follow-up.   Today: No acute events overnight.  Pt denies problems with ambulating, voiding or po intake.  She denies nausea or vomiting.  Pain is well controlled.  Lochia Minimal.  Plan for birth control is  no method.  Method of Feeding: bottle  Physical Exam:  General: alert and  cooperative Lochia: appropriate Uterine Fundus: firm Incision: none DVT Evaluation: No evidence of DVT seen on physical exam. Psych: no sign of depression or anxiety  H/H: Lab Results  Component Value Date/Time   HGB 10.6 (L) 12/26/2016 08:04 AM   HCT 33.4 (L) 12/26/2016 08:04 AM    Discharge Diagnoses: Term Pregnancy-delivered  Discharge Information: Date: 12/27/2016 Activity: pelvic rest Diet: routine  Medications: None Breast feeding:  No: bottle Condition: stable Instructions: refer to handout Discharge to: home   Discharge Instructions    Activity as tolerated    Complete by:  As directed    Baby Love Nurse Visit    Complete by:  As directed    Please check blood pressure at 1 week   Call MD for:  persistant nausea and vomiting    Complete by:  As directed    Call MD for:  severe uncontrolled pain    Complete by:  As directed    Call MD for:  temperature >100.4    Complete by:  As directed    Diet - low sodium heart healthy    Complete by:  As directed         Rolm Bookbinder , DO OB Fellow 12/27/2016,7:07 AM

## 2016-12-28 MED ORDER — IBUPROFEN 100 MG/5ML PO SUSP: 600.0000 mg | Freq: Four times a day (QID) | ORAL | 1 refills | Status: DC

## 2016-12-31 ENCOUNTER — Other Ambulatory Visit: Payer: Self-pay | Admitting: Family Medicine

## 2016-12-31 ENCOUNTER — Emergency Department (HOSPITAL_BASED_OUTPATIENT_CLINIC_OR_DEPARTMENT_OTHER)
Admission: EM | Admit: 2016-12-31 | Discharge: 2016-12-31 | Disposition: A | Discharge status: Home / Self Care | Payer: Medicaid Other | Attending: Emergency Medicine | Admitting: Emergency Medicine

## 2016-12-31 ENCOUNTER — Encounter (HOSPITAL_BASED_OUTPATIENT_CLINIC_OR_DEPARTMENT_OTHER): Payer: Self-pay

## 2016-12-31 DIAGNOSIS — J45909 Unspecified asthma, uncomplicated: Secondary | ICD-10-CM | POA: Diagnosis not present

## 2016-12-31 DIAGNOSIS — I10 Essential (primary) hypertension: Secondary | ICD-10-CM

## 2016-12-31 LAB — CBC WITH DIFFERENTIAL/PLATELET
Basophils Absolute: 0 10*3/uL (ref 0.0–0.1)
Basophils Relative: 0 %
Eosinophils Absolute: 0.1 10*3/uL (ref 0.0–0.7)
Eosinophils Relative: 1 %
HCT: 30.4 % — ABNORMAL LOW (ref 36.0–46.0)
Hemoglobin: 9.2 g/dL — ABNORMAL LOW (ref 12.0–15.0)
Lymphocytes Relative: 18 %
Lymphs Abs: 1.8 10*3/uL (ref 0.7–4.0)
MCH: 22.2 pg — ABNORMAL LOW (ref 26.0–34.0)
MCHC: 30.3 g/dL (ref 30.0–36.0)
MCV: 73.3 fL — ABNORMAL LOW (ref 78.0–100.0)
Monocytes Absolute: 0.7 10*3/uL (ref 0.1–1.0)
Monocytes Relative: 7 %
Neutro Abs: 7.4 10*3/uL (ref 1.7–7.7)
Neutrophils Relative %: 74 %
Platelets: 404 10*3/uL — ABNORMAL HIGH (ref 150–400)
RBC: 4.15 MIL/uL (ref 3.87–5.11)
RDW: 17.7 % — ABNORMAL HIGH (ref 11.5–15.5)
WBC: 10 10*3/uL (ref 4.0–10.5)

## 2016-12-31 LAB — URINALYSIS, MICROSCOPIC (REFLEX)

## 2016-12-31 LAB — URINALYSIS, ROUTINE W REFLEX MICROSCOPIC
Bilirubin Urine: NEGATIVE
Glucose, UA: NEGATIVE mg/dL
Ketones, ur: NEGATIVE mg/dL
Nitrite: NEGATIVE
Protein, ur: NEGATIVE mg/dL
Specific Gravity, Urine: <1.005 — ABNORMAL LOW (ref 1.005–1.030)
pH: 7 (ref 5.0–8.0)

## 2016-12-31 LAB — BASIC METABOLIC PANEL
Anion gap: 8 (ref 5–15)
BUN: 5 mg/dL — ABNORMAL LOW (ref 6–20)
CO2: 22 mmol/L (ref 22–32)
Calcium: 8.9 mg/dL (ref 8.9–10.3)
Chloride: 106 mmol/L (ref 101–111)
Creatinine, Ser: 0.78 mg/dL (ref 0.44–1.00)
GFR calc Af Amer: >60 mL/min (ref >60)
GFR calc non Af Amer: >60 mL/min (ref >60)
Glucose, Bld: 95 mg/dL (ref 65–99)
Potassium: 3.7 mmol/L (ref 3.5–5.1)
Sodium: 136 mmol/L (ref 135–145)

## 2016-12-31 MED ORDER — ENALAPRIL MALEATE 10 MG PO TABS
5.0000 mg | ORAL_TABLET | Freq: Once | ORAL | Status: AC
  Administered 2016-12-31: 5 mg via ORAL
  Filled 2016-12-31: qty 1

## 2016-12-31 MED ORDER — ENALAPRIL MALEATE 5 MG PO TABS: 5.0000 mg | ORAL_TABLET | Freq: Every day | ORAL | 1 refills | Status: DC

## 2016-12-31 NOTE — ED Provider Notes (Signed)
[facility] EMERGENCY DEPARTMENT Provider Note   CSN: [id number] Arrival date & time: 12/31/16  2149     History   Chief Complaint Chief Complaint  Patient presents with  . Hypertension    HPI Brianna Byrd is a 21 y.o. female.  HPI Patient, with a past medical history of preeclampsia during the end of pregnancy and delivered approximately 4 days ago after induction, presents to ED for high blood pressure. She states that her preeclampsia began during the last few days of her pregnancy and she was induced because of it. Her doctor informed her that her high blood pressure could continue even after delivery. She was called in a blood pressure medication earlier today by her OB/GYN doctor but the pharmacy is unable to fill it until tomorrow. She states that her swelling in her legs have improved but she is here because she is more so just worried about her high blood pressure but states that her symptoms have improved. She has no history of high blood pressure before her pregnancy or delivery. She denies any shortness of breath, chest pain, urinary symptoms, fevers, cough, prior MI, DVT.  Past Medical History:  Diagnosis Date  . Asthma   . Reflux     Patient Active Problem List   Diagnosis Date Noted  . SVD (spontaneous vaginal delivery) 12/26/2016    History reviewed. No pertinent surgical history.  OB History    Gravida Para Term Preterm AB Living   SAB TAB Ectopic Multiple Live Births         0 1       Home Medications    Prior to Admission medications   Medication Sig Start Date End Date Taking? Authorizing Provider  enalapril (VASOTEC) 5 MG tablet Take 1 tablet (5 mg total) by mouth daily. 12/31/16   Reva Bores, MD  ibuprofen (ADVIL,MOTRIN) 100 MG/5ML suspension Take 30 mLs (600 mg total) by mouth every 6 (six) hours. 12/28/16   Arlyce Harman, DO  Prenatal Vit-Fe Fumarate-FA (PRENATAL MULTIVITAMIN) TABS tablet Take 1 tablet by  mouth daily at 12 noon.    [provider]    Family History No family history on file.  Social History Social History  Substance Use Topics  . Smoking status: Never Smoker  . Smokeless tobacco: Never Used  . Alcohol use No     Allergies   Peach [prunus persica]   Review of Systems Review of Systems  Constitutional: Negative for appetite change, chills and fever.  HENT: Negative for ear pain, rhinorrhea, sneezing and sore throat.   Eyes: Negative for photophobia and visual disturbance.  Respiratory: Negative for cough, chest tightness, shortness of breath and wheezing.   Cardiovascular: Positive for leg swelling. Negative for chest pain and palpitations.  Gastrointestinal: Negative for abdominal pain, blood in stool, constipation, diarrhea, nausea and vomiting.  Genitourinary: Negative for dysuria, hematuria and urgency.  Musculoskeletal: Negative for myalgias.  Skin: Negative for rash.  Neurological: Negative for dizziness, weakness and light-headedness.     Physical Exam Updated Vital Signs BP (!) 149/89 (BP Location: Left Arm)   Pulse 64   Temp 98.7 F (37.1 C) (Oral)   Resp 18   Ht  (1.575 m)   Wt 91.6 kg (201 lb 15.1 oz)   LMP 03/20/2016   SpO2 100%   BMI 36.94 kg/m   Physical Exam  Constitutional: She appears well-developed and well-nourished. No distress.  Nontoxic  appearing and in no acute distress.  HENT:  Head: Normocephalic and atraumatic.  Nose: Nose normal.  Eyes: Conjunctivae and EOM are normal. Left eye exhibits no discharge. No scleral icterus.  Neck: Normal range of motion. Neck supple.  Cardiovascular: Normal rate, regular rhythm, normal heart sounds and intact distal pulses.  Exam reveals no gallop and no friction rub.   No murmur heard. Pulmonary/Chest: Effort normal and breath sounds normal. No respiratory distress.  Abdominal: Soft. Bowel sounds are normal. She exhibits no distension. There is no tenderness. There is no  guarding.  Musculoskeletal: Normal range of motion. She exhibits edema.  Mild bilateral lower extremity edema which is symmetrical.  Neurological: She is alert. She exhibits normal muscle tone. Coordination normal.  Skin: Skin is warm and dry. No rash noted.  Psychiatric: She has a normal mood and affect.  Nursing note and vitals reviewed.    ED Treatments / Results  Labs (all labs ordered are listed, but only abnormal results are displayed) Labs Reviewed  CBC WITH DIFFERENTIAL/PLATELET - Abnormal; Notable for the following:       Result Value   Hemoglobin 9.2 (*)    HCT 30.4 (*)    MCV 73.3 (*)    MCH 22.2 (*)    RDW 17.7 (*)    Platelets 404 (*)    All other components within normal limits  BASIC METABOLIC PANEL - Abnormal; Notable for the following:    BUN 5 (*)    All other components within normal limits  URINALYSIS, ROUTINE W REFLEX MICROSCOPIC - Abnormal; Notable for the following:    Color, Urine BROWN (*)    APPearance CLOUDY (*)    Specific Gravity, Urine <1.005 (*)    Hgb urine dipstick LARGE (*)    Leukocytes, UA MODERATE (*)    All other components within normal limits  URINALYSIS, MICROSCOPIC (REFLEX) - Abnormal; Notable for the following:    Bacteria, UA FEW (*)    Squamous Epithelial / LPF 0-5 (*)    All other components within normal limits    EKG  EKG Interpretation None       Radiology No results found.  Procedures Procedures (including critical care time)  Medications Ordered in ED Medications  enalapril (VASOTEC) tablet 5 mg (5 mg Oral Given 12/31/16 2246)     Initial Impression / Assessment and Plan / ED Course  I have reviewed the triage vital signs and the nursing notes.  Pertinent labs & imaging results that were available during my care of the patient were reviewed by me and considered in my medical decision making (see chart for details).     Patient, with a past medical history of preeclampsia and delivered 4 days ago,  presents to ED for HTN. She was called in enalapril by her OB/GYN but pharmacy was unable to fill until tomorrow. She actually reports improvement in her symptoms including lower extremity edema since delivery. She is mostly just concerned that her blood pressure numbers are high. She denies any chest pain, trouble breathing, vision changes. She does have a headache earlier today which resolved with Tylenol sinus medication. Physical exam patient does have some bilateral lower extremity edema which appears symmetrical. She is nontoxic appearing and in no acute distress. Normal heart rate noted.She has no deficits on neurological exam. Patient blood pressure elevated here in the ED. Lab work including CBC, BMP unremarkable. Hemoglobin and hematocrit similar to previous values. Urinalysis with no evidence of proteinuria or UTI. Patient given  1 dose of enalapril here in the ED and will discharge home with follow-up with OB/GYN and to take enalapril as directed by her OB/GYN. Patient does not think she needs to be admitted for her high blood pressure. I suspect that this is due to her preeclampsia that has continued despite her delivery. Patient appears stable for discharge at this time. Strict return precautions given.  Final Clinical Impressions(s) / ED Diagnoses   Final diagnoses:  Hypertension, unspecified type    New Prescriptions New Prescriptions   No medications on file     Dietrich Pates, PA-C 12/31/16 2352    Gwyneth Sprout, MD 01/01/17 2214

## 2016-12-31 NOTE — Progress Notes (Signed)
Patient called the [phone] number with reports of BP being elevated to 150's/90's. She is not nursing. Rx sent to [facility] in [place]

## 2016-12-31 NOTE — ED Triage Notes (Addendum)
Pt states she was dx with pre-eclampsia during pregnancy and has cont'd to have elevated BP after vaginal delivery 10/10-was advised by [facility] hosp to come to ED-new BP med was called in for pt today-she has not started med-pt NAD-steady gait

## 2016-12-31 NOTE — Discharge Instructions (Signed)
Take enalapril once daily as directed by your OB/GYN. Follow-up with OB/GYN for further evaluation. Continue other medications as previously prescribed. Return to ED for chest pain, trouble breathing, headache, vision changes, swelling in legs.

## 2017-02-12 ENCOUNTER — Ambulatory Visit (INDEPENDENT_AMBULATORY_CARE_PROVIDER_SITE_OTHER): Payer: Medicaid Other | Admitting: Medical

## 2017-02-12 ENCOUNTER — Encounter: Payer: Self-pay | Admitting: Medical

## 2017-02-12 VITALS — BP 123/86 | HR 83 | Wt 196.4 lb

## 2017-02-12 DIAGNOSIS — B372 Candidiasis of skin and nail: Secondary | ICD-10-CM

## 2017-02-12 DIAGNOSIS — Z8759 Personal history of other complications of pregnancy, childbirth and the puerperium: Secondary | ICD-10-CM

## 2017-02-12 DIAGNOSIS — Z1389 Encounter for screening for other disorder: Secondary | ICD-10-CM

## 2017-02-12 MED ORDER — NYSTATIN 100000 UNIT/GM EX CREA: TOPICAL_CREAM | CUTANEOUS | 0 refills | Status: DC

## 2017-02-12 NOTE — Patient Instructions (Signed)

## 2017-02-12 NOTE — Progress Notes (Signed)
Subjective:   States taking bp daily and stopped taking bp med when bp normal per d/c instructions.    Brianna Byrd is a 21 y.o. female who presents for a postpartum visit. She is 6 weeks postpartum following a spontaneous vaginal delivery after IOL for pre-eclampsia. Patient received prenatal in [facility]. I have fully reviewed the prenatal and intrapartum course. The delivery was at 468w5d gestational weeks. Outcome: spontaneous vaginal delivery. Anesthesia: none. Postpartum course has been normal. Baby's course has been normal. Baby is feeding by bottle - Neosure similac. Bleeding no bleeding. Bowel function is normal. Bladder function is normal. Patient is not sexually active. Contraception method is abstinence. Postpartum depression screening: negative.  The following portions of the patient's history were reviewed and updated as appropriate: allergies, current medications, past family history, past medical history, past social history, past surgical history and problem list.  Review of Systems Pertinent items are noted in HPI.   Objective:    BP 123/86   Pulse 83   Wt 196 lb 6.4 oz (89.1 kg)   Breastfeeding? No   BMI 35.92 kg/m   General:  alert and cooperative   Breasts:  deferred  Lungs: clear to auscultation bilaterally  Heart:  regular rate and rhythm, S1, S2 normal, no murmur, click, rub or gallop  Abdomen: soft, non-tender; bowel sounds normal; no masses,  no organomegaly   Vulva:  not evaluated  Vagina: not evaluated  Cervix:  not evaluated  Corpus: not examined  Adnexa:  not evaluated  Rectal Exam: Not performed.        Assessment:     Normal postpartum exam. Pap smear not done at today's visit.   History of pre-eclampsia, resolved Yeast dermatitis  Plan:    1. Contraception: condoms 2. Discontinued anti-hypertensives x 1 month, normotensive today 3. Rx for Nystatin sent to patient's pharmacy  4. Follow up in: 6 months for annual exam or sooner as needed.     Marny LowensteinWenzel, Zac Torti N, PA-C 02/12/2017 11:16 AM

## 2017-02-26 ENCOUNTER — Emergency Department (HOSPITAL_COMMUNITY): Payer: Medicaid Other

## 2017-02-26 ENCOUNTER — Encounter (HOSPITAL_COMMUNITY): Payer: Self-pay | Admitting: Emergency Medicine

## 2017-02-26 ENCOUNTER — Emergency Department (HOSPITAL_COMMUNITY)
Admission: EM | Admit: 2017-02-26 | Discharge: 2017-02-26 | Disposition: A | Discharge status: Home / Self Care | Payer: Medicaid Other | Attending: Emergency Medicine | Admitting: Emergency Medicine

## 2017-02-26 DIAGNOSIS — R1013 Epigastric pain: Secondary | ICD-10-CM

## 2017-02-26 DIAGNOSIS — J45909 Unspecified asthma, uncomplicated: Secondary | ICD-10-CM | POA: Insufficient documentation

## 2017-02-26 DIAGNOSIS — R740 Nonspecific elevation of levels of transaminase and lactic acid dehydrogenase [LDH]: Secondary | ICD-10-CM | POA: Insufficient documentation

## 2017-02-26 DIAGNOSIS — R1010 Upper abdominal pain, unspecified: Secondary | ICD-10-CM | POA: Diagnosis present

## 2017-02-26 DIAGNOSIS — Z79899 Other long term (current) drug therapy: Secondary | ICD-10-CM | POA: Insufficient documentation

## 2017-02-26 LAB — COMPREHENSIVE METABOLIC PANEL
ALT: 65 U/L — ABNORMAL HIGH (ref 14–54)
AST: 157 U/L — ABNORMAL HIGH (ref 15–41)
Albumin: 3.9 g/dL (ref 3.5–5.0)
Alkaline Phosphatase: 91 U/L (ref 38–126)
Anion gap: 8 (ref 5–15)
BUN: 7 mg/dL (ref 6–20)
CO2: 24 mmol/L (ref 22–32)
Calcium: 8.8 mg/dL — ABNORMAL LOW (ref 8.9–10.3)
Chloride: 105 mmol/L (ref 101–111)
Creatinine, Ser: 0.73 mg/dL (ref 0.44–1.00)
GFR calc Af Amer: >60 mL/min (ref >60)
GFR calc non Af Amer: >60 mL/min (ref >60)
Glucose, Bld: 105 mg/dL — ABNORMAL HIGH (ref 65–99)
Potassium: 3.3 mmol/L — ABNORMAL LOW (ref 3.5–5.1)
Sodium: 137 mmol/L (ref 135–145)
Total Bilirubin: 0.3 mg/dL (ref 0.3–1.2)
Total Protein: 7.8 g/dL (ref 6.5–8.1)

## 2017-02-26 LAB — I-STAT BETA HCG BLOOD, ED (MC, WL, AP ONLY): I-stat hCG, quantitative: <5 m[IU]/mL (ref <5)

## 2017-02-26 LAB — LIPASE, BLOOD: Lipase: 34 U/L (ref 11–51)

## 2017-02-26 LAB — CBC
HCT: 34.5 % — ABNORMAL LOW (ref 36.0–46.0)
Hemoglobin: 10.5 g/dL — ABNORMAL LOW (ref 12.0–15.0)
MCH: 23 pg — ABNORMAL LOW (ref 26.0–34.0)
MCHC: 30.4 g/dL (ref 30.0–36.0)
MCV: 75.7 fL — ABNORMAL LOW (ref 78.0–100.0)
Platelets: 312 10*3/uL (ref 150–400)
RBC: 4.56 MIL/uL (ref 3.87–5.11)
RDW: 18.7 % — ABNORMAL HIGH (ref 11.5–15.5)
WBC: 6.8 10*3/uL (ref 4.0–10.5)

## 2017-02-26 MED ORDER — ONDANSETRON 8 MG PO TBDP: 8.0000 mg | ORAL_TABLET | Freq: Three times a day (TID) | ORAL | 0 refills | Status: DC | PRN

## 2017-02-26 MED ORDER — FAMOTIDINE 20 MG PO TABS
20.0000 mg | ORAL_TABLET | Freq: Once | ORAL | Status: AC
  Administered 2017-02-26: 20 mg via ORAL
  Filled 2017-02-26: qty 1

## 2017-02-26 NOTE — ED Provider Notes (Signed)
[facility]-EMERGENCY DEPT Provider Note   CSN: [id number] Arrival date & time: 02/26/17  0747     History   Chief Complaint Chief Complaint  Patient presents with  . Abdominal Pain    HPI Brianna Byrd is a 21 y.o. female.  Patient presented to the emergency room with upper abdominal pain.  Patient has history of GERD.  This morning she tried taking some Tums.  The symptoms slowly improved but have not completely resolved.  She denies any history of gallbladder disease   The history is provided by the patient.  Abdominal Pain   This is a new problem. The current episode started 3 to 5 hours ago (Started around 5am). The problem occurs constantly. The problem has been gradually improving. The quality of the pain is aching. The pain is moderate. Pertinent negatives include anorexia, fever, diarrhea, nausea, vomiting, constipation and dysuria. Nothing aggravates the symptoms. The symptoms are relieved by antacids. Her past medical history is significant for GERD.    Past Medical History:  Diagnosis Date  . Asthma   . Reflux     Patient Active Problem List   Diagnosis Date Noted  . History of pre-eclampsia 02/12/2017  . SVD (spontaneous vaginal delivery) 12/26/2016    History reviewed. No pertinent surgical history.  OB History    Gravida Para Term Preterm AB Living   1 1 1     1    SAB TAB Ectopic Multiple Live Births         0 1       Home Medications    Prior to Admission medications   Medication Sig Start Date End Date Taking? Authorizing Provider  naproxen sodium (ALEVE) 220 MG tablet Take 220 mg by mouth daily as needed (for pain).   Yes [provider]  enalapril (VASOTEC) 5 MG tablet Take 1 tablet (5 mg total) by mouth daily. Patient not taking: Reported on 02/12/2017 12/31/16   Reva BoresPratt, Tanya S, MD  ibuprofen (ADVIL,MOTRIN) 100 MG/5ML suspension Take 30 mLs (600 mg total) by mouth every 6 (six) hours. Patient not taking:  Reported on 02/26/2017 12/28/16   Arlyce HarmanLockamy, Timothy, DO  nystatin cream (MYCOSTATIN) Apply to affected area 2 times daily Patient not taking: Reported on 02/26/2017 02/12/17   Marny LowensteinWenzel, Julie N, PA-C  ondansetron (ZOFRAN ODT) 8 MG disintegrating tablet Take 1 tablet (8 mg total) by mouth every 8 (eight) hours as needed for nausea or vomiting. 02/26/17   Linwood DibblesKnapp, Vihan Santagata, MD    Family History No family history on file.  Social History Social History   Tobacco Use  . Smoking status: Never Smoker  . Smokeless tobacco: Never Used  Substance Use Topics  . Alcohol use: No  . Drug use: No     Allergies   Peach [prunus persica]   Review of Systems Review of Systems  Constitutional: Negative for fever.  Gastrointestinal: Positive for abdominal pain. Negative for anorexia, constipation, diarrhea, nausea and vomiting.  Genitourinary: Negative for dysuria.  All other systems reviewed and are negative.    Physical Exam Updated Vital Signs BP 128/78 (BP Location: Right Arm)   Pulse 88   Temp 98 F (36.7 C) (Oral)   Resp 16   LMP 02/25/2017   SpO2 100%   Physical Exam  Constitutional: She appears well-developed and well-nourished. No distress.  HENT:  Head: Normocephalic and atraumatic.  Right Ear: External ear normal.  Left Ear: External ear normal.  Eyes: Conjunctivae are normal. Right eye exhibits  no discharge. Left eye exhibits no discharge. No scleral icterus.  Neck: Neck supple. No tracheal deviation present.  Cardiovascular: Normal rate, regular rhythm and intact distal pulses.  Pulmonary/Chest: Effort normal and breath sounds normal. No stridor. No respiratory distress. She has no wheezes. She has no rales.  Abdominal: Soft. Bowel sounds are normal. She exhibits no distension. There is no tenderness. There is no rebound and no guarding.  Musculoskeletal: She exhibits no edema or tenderness.  Neurological: She is alert. She has normal strength. No cranial nerve deficit (no  facial droop, extraocular movements intact, no slurred speech) or sensory deficit. She exhibits normal muscle tone. She displays no seizure activity. Coordination normal.  Skin: Skin is warm and dry. No rash noted.  Psychiatric: She has a normal mood and affect.  Nursing note and vitals reviewed.    ED Treatments / Results  Labs (all labs ordered are listed, but only abnormal results are displayed) Labs Reviewed  COMPREHENSIVE METABOLIC PANEL - Abnormal; Notable for the following components:      Result Value   Potassium 3.3 (*)    Glucose, Bld 105 (*)    Calcium 8.8 (*)    AST 157 (*)    ALT 65 (*)    All other components within normal limits  CBC - Abnormal; Notable for the following components:   Hemoglobin 10.5 (*)    HCT 34.5 (*)    MCV 75.7 (*)    MCH 23.0 (*)    RDW 18.7 (*)    All other components within normal limits  LIPASE, BLOOD  URINALYSIS, ROUTINE W REFLEX MICROSCOPIC  I-STAT BETA HCG BLOOD, ED ([facility], [facility], [facility] ONLY)     Radiology Koreas Abdomen Complete  Result Date: 02/26/2017 CLINICAL DATA:  Acute abdominal pain and nausea. Elevated liver function tests. EXAM: ABDOMEN ULTRASOUND COMPLETE COMPARISON:  None. FINDINGS: Gallbladder: No gallstones or wall thickening visualized. No sonographic Murphy sign noted by sonographer. Common bile duct: Diameter: 3 mm, within normal limits. Liver: No focal lesion identified. Within normal limits in parenchymal echogenicity. Portal vein is patent on color Doppler imaging with normal direction of blood flow towards the liver. IVC: No abnormality visualized. Pancreas: Visualized portion unremarkable. Spleen: Size and appearance within normal limits. Right Kidney: Length: 12.1 cm. Echogenicity within normal limits. No mass or hydronephrosis visualized. Left Kidney: Length: 11.3 cm. Echogenicity within normal limits. No mass or hydronephrosis visualized. Abdominal aorta: No aneurysm visualized. Other findings: None. IMPRESSION: Negative. No  hepatobiliary or other significant abnormality identified. Electronically Signed   By: Myles RosenthalJohn  Stahl M.D.   On: 02/26/2017 11:17    Procedures Procedures (including critical care time)  Medications Ordered in ED Medications  famotidine (PEPCID) tablet 20 mg (20 mg Oral Given 02/26/17 0855)     Initial Impression / Assessment and Plan / ED Course  I have reviewed the triage vital signs and the nursing notes.  Pertinent labs & imaging results that were available during my care of the patient were reviewed by me and considered in my medical decision making (see chart for details).   Patient presented to the emergency room with upper abdominal pain.  Her symptoms were concerning for the possibility of biliary colic.  She did have a mild increase in her LFTs.  Ultrasound was performed.  Patient does not have any evidence of gallbladder disease.  She is not having any trouble with nausea vomiting here.  Patient is feeling better.  I will have her take over-the-counter Pepcid AC.  Zofran as needed for nausea.  Follow-up with her primary care doctor next week to recheck her liver tests  Final Clinical Impressions(s) / ED Diagnoses   Final diagnoses:  Epigastric pain    ED Discharge Orders        Ordered    ondansetron (ZOFRAN ODT) 8 MG disintegrating tablet  Every 8 hours PRN     02/26/17 1247       Linwood Dibbles, MD 02/26/17 1248

## 2017-02-26 NOTE — ED Notes (Signed)
Pt refuses IV start and blood draw

## 2017-02-26 NOTE — Discharge Instructions (Signed)
Follow up with your primary care doctor, try taking Pepcid AC to help with your acid reflux issues.

## 2017-02-26 NOTE — ED Triage Notes (Signed)
Patient c/o RUQ pain that radiates around to back that started this morning. Patient reports pain worse after eating/drinking. Denies any v/n/d.

## 2017-08-03 ENCOUNTER — Encounter (HOSPITAL_BASED_OUTPATIENT_CLINIC_OR_DEPARTMENT_OTHER): Payer: Self-pay | Admitting: Emergency Medicine

## 2017-08-03 ENCOUNTER — Emergency Department (HOSPITAL_BASED_OUTPATIENT_CLINIC_OR_DEPARTMENT_OTHER)
Admission: EM | Admit: 2017-08-03 | Discharge: 2017-08-03 | Disposition: A | Discharge status: Home / Self Care | Payer: Medicaid Other | Attending: Emergency Medicine | Admitting: Emergency Medicine

## 2017-08-03 ENCOUNTER — Emergency Department (HOSPITAL_BASED_OUTPATIENT_CLINIC_OR_DEPARTMENT_OTHER): Payer: Medicaid Other

## 2017-08-03 ENCOUNTER — Other Ambulatory Visit: Payer: Self-pay

## 2017-08-03 DIAGNOSIS — J45909 Unspecified asthma, uncomplicated: Secondary | ICD-10-CM | POA: Insufficient documentation

## 2017-08-03 DIAGNOSIS — R079 Chest pain, unspecified: Secondary | ICD-10-CM | POA: Insufficient documentation

## 2017-08-03 DIAGNOSIS — Z79899 Other long term (current) drug therapy: Secondary | ICD-10-CM | POA: Diagnosis not present

## 2017-08-03 MED ORDER — ALBUTEROL SULFATE (2.5 MG/3ML) 0.083% IN NEBU: 2.5000 mg | INHALATION_SOLUTION | Freq: Four times a day (QID) | RESPIRATORY_TRACT | 12 refills | Status: DC | PRN

## 2017-08-03 MED ORDER — ALBUTEROL SULFATE HFA 108 (90 BASE) MCG/ACT IN AERS: 1.0000 | INHALATION_SPRAY | Freq: Four times a day (QID) | RESPIRATORY_TRACT | 1 refills | Status: DC | PRN

## 2017-08-03 MED ORDER — MELOXICAM 15 MG PO TABS: 15.0000 mg | ORAL_TABLET | Freq: Every day | ORAL | 0 refills | Status: DC | PRN

## 2017-08-03 NOTE — ED Triage Notes (Signed)
Patient states that she has had pain to her left chest x 2 days - increased pain with movement and it radiated across her chest to the right side. She also reports that it feels like it is in her boob

## 2017-08-03 NOTE — ED Provider Notes (Signed)
[facility] EMERGENCY DEPARTMENT Provider Note   CSN: [id number] Arrival date & time: 08/03/17  4098     History   Chief Complaint Chief Complaint  Patient presents with  . Chest Pain    HPI Brianna Byrd is a 22 y.o. female.  HPI   22 year old female with chest pain.  Left-sided.  Onset a few days ago.  Persistent since then.  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  She denies any discrete trauma.  Says the pain is worse with deep breaths and certain movements.  She does not feel short of breath.  No cough.  No unusual leg pain or swelling.  Past Medical History:  Diagnosis Date  . Asthma   . Reflux     Patient Active Problem List   Diagnosis Date Noted  . History of pre-eclampsia 02/12/2017  . SVD (spontaneous vaginal delivery) 12/26/2016    History reviewed. No pertinent surgical history.   OB History    Gravida  1   Para  1   Term  1   Preterm      AB      Living  1     SAB      TAB      Ectopic      Multiple  0   Live Births  1            Home Medications    Prior to Admission medications   Medication Sig Start Date End Date Taking? Authorizing Provider  albuterol (PROVENTIL HFA;VENTOLIN HFA) 108 (90 Base) MCG/ACT inhaler Inhale 1-2 puffs into the lungs every 6 (six) hours as needed for wheezing or shortness of breath. 08/03/17   Raeford Razor, MD  albuterol (PROVENTIL) (2.5 MG/3ML) 0.083% nebulizer solution Take 3 mLs (2.5 mg total) by nebulization every 6 (six) hours as needed for wheezing or shortness of breath. 08/03/17   Raeford Razor, MD  enalapril (VASOTEC) 5 MG tablet Take 1 tablet (5 mg total) by mouth daily. Patient not taking: Reported on 02/12/2017 12/31/16   Reva Bores, MD  ibuprofen (ADVIL,MOTRIN) 100 MG/5ML suspension Take 30 mLs (600 mg total) by mouth every 6 (six)  hours. Patient not taking: Reported on 02/26/2017 12/28/16   Arlyce Harman, DO  meloxicam (MOBIC) 15 MG tablet Take 1 tablet (15 mg total) by mouth daily as needed for pain. 08/03/17   Raeford Razor, MD  naproxen sodium (ALEVE) 220 MG tablet Take 220 mg by mouth daily as needed (for pain).    [provider]  nystatin cream (MYCOSTATIN) Apply to affected area 2 times daily Patient not taking: Reported on 02/26/2017 02/12/17   Marny Lowenstein, PA-C  ondansetron (ZOFRAN ODT) 8 MG disintegrating tablet Take 1 tablet (8 mg total) by mouth every 8 (eight) hours as needed for nausea or vomiting. 02/26/17   Linwood Dibbles, MD    Family History History reviewed. No pertinent family history.  Social History Social History   Tobacco Use  . Smoking status: Never Smoker  . Smokeless tobacco: Never Used  Substance Use Topics  . Alcohol use: No  . Drug use: No     Allergies   Peach [prunus persica]   Review of Systems Review of Systems  All systems reviewed and negative, other than as noted in HPI.  Physical Exam Updated Vital Signs BP 111/77 (BP Location: Left Arm)  Pulse 80   Temp 98.3 F (36.8 C) (Oral)   Resp 16   Ht  (1.575 m)   Wt 86.6 kg (191 lb)   LMP 07/01/2017   SpO2 100%   BMI 34.93 kg/m   Physical Exam  Constitutional: She appears well-developed and well-nourished. No distress.  HENT:  Head: Normocephalic and atraumatic.  Eyes: Conjunctivae are normal. Right eye exhibits no discharge. Left eye exhibits no discharge.  Neck: Neck supple.  Cardiovascular: Normal rate, regular rhythm and normal heart sounds. Exam reveals no gallop and no friction rub.  No murmur heard. Pulmonary/Chest: Effort normal and breath sounds normal. No respiratory distress.  Abdominal: Soft. She exhibits no distension. There is no tenderness.  Musculoskeletal: She exhibits no edema or tenderness.  Lower extremities symmetric as compared to each other. No calf tenderness.  Negative Homan's. No palpable cords.   Neurological: She is alert.  Skin: Skin is warm and dry.  Psychiatric: She has a normal mood and affect. Her behavior is normal. Thought content normal.  Nursing note and vitals reviewed.    ED Treatments / Results  Labs (all labs ordered are listed, but only abnormal results are displayed) Labs Reviewed - No data to display  EKG EKG Interpretation  Date/Time:  Saturday Aug 03 2017 08:55:07 EDT Ventricular Rate:  80 PR Interval:    QRS Duration: 85 QT Interval:  383 QTC Calculation: 442 R Axis:   81 Text Interpretation:  Sinus rhythm Baseline wander in lead(s) I II aVR Confirmed by Raeford Razor [phone]) on 08/03/2017 9:20:30 AM   Radiology Dg Chest 2 View  Result Date: 08/03/2017 CLINICAL DATA:  Left-sided chest pain 2 days. EXAM: CHEST - 2 VIEW COMPARISON:  05/26/2015 FINDINGS: The heart size and mediastinal contours are within normal limits. Both lungs are clear. The visualized skeletal structures are unremarkable. IMPRESSION: No active cardiopulmonary disease. Electronically Signed   By: Elberta Fortis M.D.   On: 08/03/2017 09:29    Procedures Procedures (including critical care time)  Medications Ordered in ED Medications - No data to display   Initial Impression / Assessment and Plan / ED Course  I have reviewed the triage vital signs and the nursing notes.  Pertinent labs & imaging results that were available during my care of the patient were reviewed by me and considered in my medical decision making (see chart for details).     22 year old female with chest pain.  Very consistent with musculoskeletal etiology.  She is very tender to palpation over left upper anterior chest.  Also reports increase in pain with deep breaths and movement.  She sounds clear on exam.  Denies any dyspnea or cough.  Chest x-ray is without acute abnormality.  Plan continue symptomatic treatment with as needed NSAIDs.  She was provided with  prescriptions for albuterol per request.  Final Clinical Impressions(s) / ED Diagnoses   Final diagnoses:  Chest pain, unspecified type    ED Discharge Orders        Ordered    meloxicam (MOBIC) 15 MG tablet  Daily PRN     08/03/17 0927    albuterol (PROVENTIL HFA;VENTOLIN HFA) 108 (90 Base) MCG/ACT inhaler  Every 6 hours PRN     08/03/17 0927    albuterol (PROVENTIL) (2.5 MG/3ML) 0.083% nebulizer solution  Every 6 hours PRN     08/03/17 0927       Raeford Razor, MD 08/03/17 [phone]

## 2017-08-05 NOTE — Care Management Note (Signed)
Case Management Note  CM is unable to provide DME nebulier for pt without orders. CM sent a message to Dr. Juleen China for orders on 08/05/2017. Orders are still not placed as of 08/16/2017 at the time of this note.  Pt's PCP will have to order DME at time of follow up.  No further CM needs noted at this time.

## 2017-08-26 DIAGNOSIS — E040 Nontoxic diffuse goiter: Secondary | ICD-10-CM | POA: Insufficient documentation

## 2017-08-26 DIAGNOSIS — E049 Nontoxic goiter, unspecified: Secondary | ICD-10-CM | POA: Insufficient documentation

## 2017-08-26 DIAGNOSIS — N912 Amenorrhea, unspecified: Secondary | ICD-10-CM | POA: Insufficient documentation

## 2017-10-06 ENCOUNTER — Other Ambulatory Visit: Payer: Self-pay

## 2017-10-06 ENCOUNTER — Emergency Department (HOSPITAL_BASED_OUTPATIENT_CLINIC_OR_DEPARTMENT_OTHER)
Admission: EM | Admit: 2017-10-06 | Discharge: 2017-10-06 | Disposition: A | Discharge status: Home / Self Care | Payer: Medicaid Other | Attending: Emergency Medicine | Admitting: Emergency Medicine

## 2017-10-06 ENCOUNTER — Encounter (HOSPITAL_BASED_OUTPATIENT_CLINIC_OR_DEPARTMENT_OTHER): Payer: Self-pay

## 2017-10-06 DIAGNOSIS — J069 Acute upper respiratory infection, unspecified: Secondary | ICD-10-CM

## 2017-10-06 DIAGNOSIS — K0889 Other specified disorders of teeth and supporting structures: Secondary | ICD-10-CM | POA: Diagnosis not present

## 2017-10-06 DIAGNOSIS — R599 Enlarged lymph nodes, unspecified: Secondary | ICD-10-CM | POA: Diagnosis present

## 2017-10-06 DIAGNOSIS — J45909 Unspecified asthma, uncomplicated: Secondary | ICD-10-CM | POA: Insufficient documentation

## 2017-10-06 MED ORDER — AMOXICILLIN 400 MG/5ML PO SUSR: 400.0000 mg | Freq: Three times a day (TID) | ORAL | 0 refills | Status: DC

## 2017-10-06 NOTE — ED Triage Notes (Signed)
Pt reports productive cough- and enlarge lymph nodes. Denies fevers

## 2017-10-06 NOTE — ED Provider Notes (Signed)
[facility] EMERGENCY DEPARTMENT Provider Note   CSN: [id number] Arrival date & time: 10/06/17  1508     History   Chief Complaint Chief Complaint  Patient presents with  . URI    HPI Brianna Byrd is a 22 y.o. female.  Patient is a 22 year old female who presents with swollen lymph nodes.  She reports a 3 to 4-day history of some minor URI symptoms with some postnasal drip.  She is coughing up some phlegm but mostly coming from her throat.  She denies any chest congestion.  No shortness of breath.  No known fevers.  No chest pain.  She has some soreness primarily in her lymph nodes on the left side.  She also has noticed a 2-day history of pain in her left back molar.  She denies any facial swelling.  Of note, she has not had a period since March.  She did have a child 9 months ago.  She seen her OB/GYN since then who is done in blood pregnancy test that was negative.  She is taken several home pregnancy tests that have been negative.     Past Medical History:  Diagnosis Date  . Asthma   . Reflux     Patient Active Problem List   Diagnosis Date Noted  . History of pre-eclampsia 02/12/2017  . SVD (spontaneous vaginal delivery) 12/26/2016    History reviewed. No pertinent surgical history.   OB History    Gravida  1   Para  1   Term  1   Preterm      AB      Living  1     SAB      TAB      Ectopic      Multiple  0   Live Births  1            Home Medications    Prior to Admission medications   Medication Sig Start Date End Date Taking? Authorizing Provider  albuterol (PROVENTIL HFA;VENTOLIN HFA) 108 (90 Base) MCG/ACT inhaler Inhale 1-2 puffs into the lungs every 6 (six) hours as needed for wheezing or shortness of breath. 08/03/17   Raeford RazorKohut, Stephen, MD  albuterol (PROVENTIL) (2.5 MG/3ML) 0.083% nebulizer solution Take 3 mLs (2.5 mg total) by nebulization every 6 (six) hours as needed for wheezing or shortness of breath. 08/03/17    Raeford RazorKohut, Stephen, MD  amoxicillin (AMOXIL) 400 MG/5ML suspension Take 5 mLs (400 mg total) by mouth 3 (three) times daily. For 10 days 10/06/17   Rolan BuccoBelfi, Lorayne Getchell, MD  enalapril (VASOTEC) 5 MG tablet Take 1 tablet (5 mg total) by mouth daily. Patient not taking: Reported on 02/12/2017 12/31/16   Reva BoresPratt, Tanya S, MD  ibuprofen (ADVIL,MOTRIN) 100 MG/5ML suspension Take 30 mLs (600 mg total) by mouth every 6 (six) hours. Patient not taking: Reported on 02/26/2017 12/28/16   Arlyce HarmanLockamy, Timothy, DO  meloxicam (MOBIC) 15 MG tablet Take 1 tablet (15 mg total) by mouth daily as needed for pain. 08/03/17   Raeford RazorKohut, Stephen, MD  naproxen sodium (ALEVE) 220 MG tablet Take 220 mg by mouth daily as needed (for pain).    [provider]  nystatin cream (MYCOSTATIN) Apply to affected area 2 times daily Patient not taking: Reported on 02/26/2017 02/12/17   Marny LowensteinWenzel, Julie N, PA-C  ondansetron (ZOFRAN ODT) 8 MG disintegrating tablet Take 1 tablet (8 mg total) by mouth every 8 (eight) hours as needed for nausea or vomiting. 02/26/17   Linwood DibblesKnapp, Jon,  MD    Family History No family history on file.  Social History Social History   Tobacco Use  . Smoking status: Never Smoker  . Smokeless tobacco: Never Used  Substance Use Topics  . Alcohol use: No  . Drug use: No     Allergies   Peach [prunus persica]   Review of Systems Review of Systems  Constitutional: Negative for chills, diaphoresis, fatigue and fever.  HENT: Positive for congestion, dental problem and postnasal drip. Negative for facial swelling, rhinorrhea, sneezing, sore throat, trouble swallowing and voice change.   Eyes: Negative.   Respiratory: Positive for cough. Negative for chest tightness and shortness of breath.   Cardiovascular: Negative for chest pain and leg swelling.  Gastrointestinal: Negative for abdominal pain, blood in stool, diarrhea, nausea and vomiting.  Genitourinary: Negative for difficulty urinating, flank pain, frequency  and hematuria.  Musculoskeletal: Negative for arthralgias and back pain.  Skin: Negative for rash.  Neurological: Negative for dizziness, speech difficulty, weakness, numbness and headaches.     Physical Exam Updated Vital Signs BP 112/70 (BP Location: Left Arm)   Pulse 89   Temp 98.2 F (36.8 C) (Oral)   Resp 15   Ht 5\' 2"  (1.575 m)   Wt 88 kg (194 lb)   LMP 06/05/2017   SpO2 100%   BMI 35.48 kg/m   Physical Exam  Constitutional: She is oriented to person, place, and time. She appears well-developed and well-nourished.  HENT:  Head: Normocephalic and atraumatic.  Right Ear: External ear normal.  Left Ear: External ear normal.  Mouth/Throat: Oropharynx is clear and moist.  Patient has some tenderness to her left lower back molar.  There is no fluctuance or suggestions of a peritonsillar abscess.  No trismus.  There is some enlarged submandibular lymph nodes on the left.  There is no other masses palpated.  No pain or swelling over the parotid gland.  No elevation of the tongue.  Eyes: Pupils are equal, round, and reactive to light.  Neck: Normal range of motion. Neck supple.  Cardiovascular: Normal rate, regular rhythm and normal heart sounds.  Pulmonary/Chest: Effort normal and breath sounds normal. No respiratory distress. She has no wheezes. She has no rales. She exhibits no tenderness.  Abdominal: Soft. Bowel sounds are normal. There is no tenderness. There is no rebound and no guarding.  Musculoskeletal: Normal range of motion. She exhibits no edema.  Lymphadenopathy:    She has cervical adenopathy.  Neurological: She is alert and oriented to person, place, and time.  Skin: Skin is warm and dry. No rash noted.  Psychiatric: She has a normal mood and affect.     ED Treatments / Results  Labs (all labs ordered are listed, but only abnormal results are displayed) Labs Reviewed - No data to display  EKG None  Radiology No results found.  Procedures Procedures  (including critical care time)  Medications Ordered in ED Medications - No data to display   Initial Impression / Assessment and Plan / ED Course  I have reviewed the triage vital signs and the nursing notes.  Pertinent labs & imaging results that were available during my care of the patient were reviewed by me and considered in my medical decision making (see chart for details).     Patient presents with some URI symptoms and enlarged left lymph node.  She is well-appearing.  Her vital signs are normal.  Her lungs are clear without suggestions on clinical exam of pneumonia.  This  is likely related to a viral illness.  There is no suggestions of underlying mass or other etiology for the swelling.  Its consistent with an enlarged submandibular lymph node.  She does have pain to the tooth on the left as well so I will start her on antibiotics for that.  She has not had a period in the last several months but she had several negative pregnancy test and she is currently refusing to have a pregnancy test done in the ED.  She was encouraged to follow-up with her OB/GYN.  She was started on amoxicillin.  Return precautions were given.  Final Clinical Impressions(s) / ED Diagnoses   Final diagnoses:  Viral upper respiratory tract infection  Pain, dental    ED Discharge Orders        Ordered    amoxicillin (AMOXIL) 400 MG/5ML suspension  3 times daily     10/06/17 1531       Rolan Bucco, MD 10/06/17 1537

## 2018-01-06 ENCOUNTER — Emergency Department (HOSPITAL_BASED_OUTPATIENT_CLINIC_OR_DEPARTMENT_OTHER): Payer: Medicaid Other

## 2018-01-06 ENCOUNTER — Encounter (HOSPITAL_BASED_OUTPATIENT_CLINIC_OR_DEPARTMENT_OTHER): Payer: Self-pay | Admitting: *Deleted

## 2018-01-06 ENCOUNTER — Other Ambulatory Visit: Payer: Self-pay

## 2018-01-06 ENCOUNTER — Emergency Department (HOSPITAL_BASED_OUTPATIENT_CLINIC_OR_DEPARTMENT_OTHER)
Admission: EM | Admit: 2018-01-06 | Discharge: 2018-01-06 | Disposition: A | Discharge status: Home / Self Care | Payer: Medicaid Other | Attending: Emergency Medicine | Admitting: Emergency Medicine

## 2018-01-06 DIAGNOSIS — J45909 Unspecified asthma, uncomplicated: Secondary | ICD-10-CM | POA: Insufficient documentation

## 2018-01-06 DIAGNOSIS — R51 Headache: Secondary | ICD-10-CM | POA: Diagnosis present

## 2018-01-06 DIAGNOSIS — G44209 Tension-type headache, unspecified, not intractable: Secondary | ICD-10-CM

## 2018-01-06 MED ORDER — METOCLOPRAMIDE HCL 5 MG/ML IJ SOLN
10.0000 mg | Freq: Once | INTRAMUSCULAR | Status: DC
  Filled 2018-01-06: qty 2

## 2018-01-06 MED ORDER — DIPHENHYDRAMINE HCL 50 MG/ML IJ SOLN
25.0000 mg | Freq: Once | INTRAMUSCULAR | Status: DC
  Filled 2018-01-06: qty 1

## 2018-01-06 MED ORDER — DEXAMETHASONE SODIUM PHOSPHATE 10 MG/ML IJ SOLN
10.0000 mg | Freq: Once | INTRAMUSCULAR | Status: DC
  Filled 2018-01-06: qty 1

## 2018-01-06 MED ORDER — KETOROLAC TROMETHAMINE 30 MG/ML IJ SOLN
30.0000 mg | Freq: Once | INTRAMUSCULAR | Status: DC
  Filled 2018-01-06: qty 1

## 2018-01-06 MED ORDER — SODIUM CHLORIDE 0.9 % IV BOLUS: 1000.0000 mL | Freq: Once | INTRAVENOUS | Status: DC

## 2018-01-06 NOTE — ED Triage Notes (Signed)
C/o headache since 1100 and dizziness. She has taken tylenol without relief. Denies nausea

## 2018-01-06 NOTE — ED Provider Notes (Signed)
[facility] EMERGENCY DEPARTMENT Provider Note   CSN: [id number] Arrival date & time: 01/06/18  0023     History   Chief Complaint Chief Complaint  Patient presents with  . Headache    HPI Brianna Byrd is a 22 y.o. female.  The history is provided by the patient.  She has history of asthma, reflux, preeclampsia and comes in complaining of a bifrontal headache for the last 3 days.  Headache is moderately severe and she rates it at 7/10.  She notes that her vision is blurred with a headache but she denies photophobia or phonophobia.  There is no nausea or vomiting.  She has been taking acetaminophen and ibuprofen which do give temporary relief of headache, but, when she wakes up 6 to 8 hours later, headache recurs.  She describes it as a sharp pain.  Nothing seems to make it worse.  She has not been prone to headaches except for when she had preeclampsia.  Past Medical History:  Diagnosis Date  . Asthma   . Reflux     Patient Active Problem List   Diagnosis Date Noted  . History of pre-eclampsia 02/12/2017  . SVD (spontaneous vaginal delivery) 12/26/2016    History reviewed. No pertinent surgical history.   OB History    Gravida  1   Para  1   Term  1   Preterm      AB      Living  1     SAB      TAB      Ectopic      Multiple  0   Live Births  1            Home Medications    Prior to Admission medications   Medication Sig Start Date End Date Taking? Authorizing Provider  albuterol (PROVENTIL HFA;VENTOLIN HFA) 108 (90 Base) MCG/ACT inhaler Inhale 1-2 puffs into the lungs every 6 (six) hours as needed for wheezing or shortness of breath. 08/03/17   Raeford Razor, MD  albuterol (PROVENTIL) (2.5 MG/3ML) 0.083% nebulizer solution Take 3 mLs (2.5 mg total) by nebulization every 6 (six) hours as needed for wheezing or shortness of breath. 08/03/17   Raeford Razor, MD  amoxicillin (AMOXIL) 400 MG/5ML suspension Take 5 mLs (400 mg total)  by mouth 3 (three) times daily. For 10 days 10/06/17   Rolan Bucco, MD  enalapril (VASOTEC) 5 MG tablet Take 1 tablet (5 mg total) by mouth daily. Patient not taking: Reported on 02/12/2017 12/31/16   Reva Bores, MD  ibuprofen (ADVIL,MOTRIN) 100 MG/5ML suspension Take 30 mLs (600 mg total) by mouth every 6 (six) hours. Patient not taking: Reported on 02/26/2017 12/28/16   Arlyce Harman, DO  meloxicam (MOBIC) 15 MG tablet Take 1 tablet (15 mg total) by mouth daily as needed for pain. 08/03/17   Raeford Razor, MD  naproxen sodium (ALEVE) 220 MG tablet Take 220 mg by mouth daily as needed (for pain).    [provider]  nystatin cream (MYCOSTATIN) Apply to affected area 2 times daily Patient not taking: Reported on 02/26/2017 02/12/17   Marny Lowenstein, PA-C  ondansetron (ZOFRAN ODT) 8 MG disintegrating tablet Take 1 tablet (8 mg total) by mouth every 8 (eight) hours as needed for nausea or vomiting. 02/26/17   Linwood Dibbles, MD    Family History No family history on file.  Social History Social History   Tobacco Use  . Smoking status: Never Smoker  .  Smokeless tobacco: Never Used  Substance Use Topics  . Alcohol use: No  . Drug use: No     Allergies   Peach [prunus persica]   Review of Systems Review of Systems  All other systems reviewed and are negative.    Physical Exam Updated Vital Signs BP 105/68 (BP Location: Left Arm)   Pulse 94   Temp 99 F (37.2 C) (Oral)   Resp 18   Ht 5\' 2"  (1.575 m)   Wt 89.4 kg   LMP 01/03/2018   SpO2 100%   Breastfeeding? No   BMI 36.03 kg/m   Physical Exam  Nursing note and vitals reviewed.  22 year old female, resting comfortably and in no acute distress. Vital signs are normal. Oxygen saturation is 100%, which is normal. Head is normocephalic and atraumatic. PERRLA, EOMI. Oropharynx is clear.  There is mild tenderness to palpation over the temporalis muscles bilaterally, and there is moderate tenderness of the  insertion of the paracervical muscles bilaterally.  Fundi show no hemorrhage, exudate, papilledema. Neck is nontender and supple without adenopathy or JVD. Back is nontender and there is no CVA tenderness. Lungs are clear without rales, wheezes, or rhonchi. Chest is nontender. Heart has regular rate and rhythm without murmur. Abdomen is soft, flat, nontender without masses or hepatosplenomegaly and peristalsis is normoactive. Extremities have no cyanosis or edema, full range of motion is present. Skin is warm and dry without rash. Neurologic: Mental status is normal, cranial nerves are intact, there are no motor or sensory deficits.  ED Treatments / Results  Labs (all labs ordered are listed, but only abnormal results are displayed) Labs Reviewed  PREGNANCY, URINE    Radiology No results found.  Procedures Procedures   Medications Ordered in ED Medications  sodium chloride 0.9 % bolus 1,000 mL (has no administration in time range)  metoCLOPramide (REGLAN) injection 10 mg (has no administration in time range)  diphenhydrAMINE (BENADRYL) injection 25 mg (has no administration in time range)  dexamethasone (DECADRON) injection 10 mg (has no administration in time range)  ketorolac (TORADOL) 30 MG/ML injection 30 mg (has no administration in time range)     Initial Impression / Assessment and Plan / ED Course  I have reviewed the triage vital signs and the nursing notes.  Pertinent labs & imaging results that were available during my care of the patient were reviewed by me and considered in my medical decision making (see chart for details).  Headache of uncertain cause.  Clinically, suspect muscle contraction headache.  This does not seem like a migraine variant.  However, since she has not had problems with headaches previously, she will be sent for CT scan.  She will also be given migraine cocktail of normal saline, metoclopramide, diphenhydramine, ketorolac.  Old records are  reviewed, and she has no relevant past visits.  Patient is declined CT scan and medications in the ED, states her headache is better and she wants to go home.  I think this is reasonable, no red flags to suggest urgent need for CT scan.  She is advised to use over-the-counter medications, use ice, return if symptoms are worsening or not being adequately controlled at home.  Final Clinical Impressions(s) / ED Diagnoses   Final diagnoses:  Muscle contraction headache    ED Discharge Orders    None       Dione Booze, MD 01/06/18 0139

## 2018-01-06 NOTE — Discharge Instructions (Addendum)
You may treat your headache with aceataminophen and/or ibuprofen. You may also try applying ice to your temples and neck.  Return if symptoms are getting worse.

## 2018-01-22 ENCOUNTER — Encounter (HOSPITAL_BASED_OUTPATIENT_CLINIC_OR_DEPARTMENT_OTHER): Payer: Self-pay | Admitting: Emergency Medicine

## 2018-01-22 ENCOUNTER — Other Ambulatory Visit: Payer: Self-pay

## 2018-01-22 ENCOUNTER — Emergency Department (HOSPITAL_BASED_OUTPATIENT_CLINIC_OR_DEPARTMENT_OTHER)
Admission: EM | Admit: 2018-01-22 | Discharge: 2018-01-22 | Disposition: A | Discharge status: Home / Self Care | Payer: Medicaid Other | Attending: Emergency Medicine | Admitting: Emergency Medicine

## 2018-01-22 DIAGNOSIS — J45909 Unspecified asthma, uncomplicated: Secondary | ICD-10-CM | POA: Insufficient documentation

## 2018-01-22 DIAGNOSIS — N938 Other specified abnormal uterine and vaginal bleeding: Secondary | ICD-10-CM | POA: Insufficient documentation

## 2018-01-22 LAB — BASIC METABOLIC PANEL
Anion gap: 6 (ref 5–15)
BUN: 8 mg/dL (ref 6–20)
CO2: 26 mmol/L (ref 22–32)
Calcium: 8.3 mg/dL — ABNORMAL LOW (ref 8.9–10.3)
Chloride: 105 mmol/L (ref 98–111)
Creatinine, Ser: 0.64 mg/dL (ref 0.44–1.00)
GFR calc Af Amer: >60 mL/min (ref >60)
GFR calc non Af Amer: >60 mL/min (ref >60)
Glucose, Bld: 115 mg/dL — ABNORMAL HIGH (ref 70–99)
Potassium: 3.2 mmol/L — ABNORMAL LOW (ref 3.5–5.1)
Sodium: 137 mmol/L (ref 135–145)

## 2018-01-22 LAB — CBC
HCT: 33.9 % — ABNORMAL LOW (ref 36.0–46.0)
Hemoglobin: 10.1 g/dL — ABNORMAL LOW (ref 12.0–15.0)
MCH: 24.8 pg — ABNORMAL LOW (ref 26.0–34.0)
MCHC: 29.8 g/dL — ABNORMAL LOW (ref 30.0–36.0)
MCV: 83.3 fL (ref 80.0–100.0)
Platelets: 352 10*3/uL (ref 150–400)
RBC: 4.07 MIL/uL (ref 3.87–5.11)
RDW: 14.1 % (ref 11.5–15.5)
WBC: 7 10*3/uL (ref 4.0–10.5)
nRBC: 0 % (ref 0.0–0.2)

## 2018-01-22 LAB — URINALYSIS, MICROSCOPIC (REFLEX)

## 2018-01-22 LAB — URINALYSIS, ROUTINE W REFLEX MICROSCOPIC
Bilirubin Urine: NEGATIVE
Glucose, UA: NEGATIVE mg/dL
Ketones, ur: NEGATIVE mg/dL
Leukocytes, UA: NEGATIVE
Nitrite: NEGATIVE
Protein, ur: NEGATIVE mg/dL
Specific Gravity, Urine: >1.03 — ABNORMAL HIGH (ref 1.005–1.030)
pH: 6 (ref 5.0–8.0)

## 2018-01-22 LAB — PREGNANCY, URINE: Preg Test, Ur: NEGATIVE

## 2018-01-22 NOTE — ED Triage Notes (Signed)
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  Vaginal bleeding x4 weeks with clots.  Gets dizzy and lightheaded.  Sees "dots".  Blurry vision.  Drove self here.

## 2018-01-22 NOTE — ED Provider Notes (Signed)
[facility] EMERGENCY DEPARTMENT Provider Note   CSN: [id number] Arrival date & time: 01/22/18  1007     History   Chief Complaint Chief Complaint  Patient presents with  . Vaginal Bleeding    HPI Brianna Byrd is a 22 y.o. female.  HPI 22 year old female presents emergency department with abnormal vaginal bleeding over the past 2 weeks.  She states initially her bleeding was heavier and now it is becoming lighter.  She originally had clots but reports this is since improving.  Intermittent nausea without vomiting.  No diarrhea.  She has had unprotected sex.  She is concerned about the possibility of pregnancy.  She also had abnormal vaginal bleeding in September as well.  She does not have a gynecologist.  Reports some lightheadedness and dizziness without syncope or preceding chest pain or palpitations.  Symptoms are mild in severity.   Past Medical History:  Diagnosis Date  . Asthma   . Reflux     Patient Active Problem List   Diagnosis Date Noted  . History of pre-eclampsia 02/12/2017  . SVD (spontaneous vaginal delivery) 12/26/2016    History reviewed. No pertinent surgical history.   OB History    Gravida  1   Para  1   Term  1   Preterm      AB      Living  1     SAB      TAB      Ectopic      Multiple  0   Live Births  1            Home Medications    Prior to Admission medications   Not on File    Family History No family history on file.  Social History Social History   Tobacco Use  . Smoking status: Never Smoker  . Smokeless tobacco: Never Used  Substance Use Topics  . Alcohol use: No  . Drug use: No     Allergies   Peach [prunus persica]   Review of Systems Review of Systems  All other systems reviewed and are negative.    Physical Exam Updated Vital Signs BP 126/90 (BP Location: Right Arm)   Pulse 96   Temp 99 F (37.2 C) (Oral)   Resp 16   Ht 5\' 2"  (1.575 m)   Wt 89.4 kg   LMP  01/03/2018   SpO2 100%   BMI 36.03 kg/m   Physical Exam  Constitutional: She is oriented to person, place, and time. She appears well-developed and well-nourished. No distress.  HENT:  Head: Normocephalic and atraumatic.  Eyes: EOM are normal.  Neck: Normal range of motion.  Cardiovascular: Normal rate, regular rhythm and normal heart sounds.  Pulmonary/Chest: Effort normal and breath sounds normal.  Abdominal: Soft. She exhibits no distension. There is no tenderness.  Musculoskeletal: Normal range of motion.  Neurological: She is alert and oriented to person, place, and time.  Skin: Skin is warm and dry.  Psychiatric: She has a normal mood and affect. Judgment normal.  Nursing note and vitals reviewed.    ED Treatments / Results  Labs (all labs ordered are listed, but only abnormal results are displayed) Labs Reviewed  CBC - Abnormal; Notable for the following components:      Result Value   Hemoglobin 10.1 (*)    HCT 33.9 (*)    MCH 24.8 (*)    MCHC 29.8 (*)    All other components within normal limits  BASIC METABOLIC PANEL - Abnormal; Notable for the following components:   Potassium 3.2 (*)    Glucose, Bld 115 (*)    Calcium 8.3 (*)    All other components within normal limits  URINALYSIS, ROUTINE W REFLEX MICROSCOPIC - Abnormal; Notable for the following components:   Specific Gravity, Urine >1.030 (*)    Hgb urine dipstick LARGE (*)    All other components within normal limits  URINALYSIS, MICROSCOPIC (REFLEX) - Abnormal; Notable for the following components:   Bacteria, UA RARE (*)    All other components within normal limits  PREGNANCY, URINE    EKG None  Radiology No results found.  Procedures Procedures (including critical care time)  Medications Ordered in ED Medications - No data to display   Initial Impression / Assessment and Plan / ED Course  I have reviewed the triage vital signs and the nursing notes.  Pertinent labs & imaging results  that were available during my care of the patient were reviewed by me and considered in my medical decision making (see chart for details).     Dysfunctional uterine bleeding.  Gynecology follow-up.  Patient will need a primary care physician as well.  Overall well-appearing.  Labs reassuring.  Abdominal exam benign.  Final Clinical Impressions(s) / ED Diagnoses   Final diagnoses:  None    ED Discharge Orders    None       Azalia Bilis, MD 01/22/18 1204

## 2018-01-22 NOTE — ED Notes (Signed)
Pt states she has been having unprotected sex.  Pt states she has been having a period since September 15.  Pt had some cramping last week with clotting.  Occasional large clot.  Breasts have been fuller, and she noticed a non-painful lump in her right breast.  Pt states she has had some intermittent nausea.

## 2018-06-04 ENCOUNTER — Encounter (HOSPITAL_BASED_OUTPATIENT_CLINIC_OR_DEPARTMENT_OTHER): Payer: Self-pay | Admitting: Emergency Medicine

## 2018-06-04 ENCOUNTER — Emergency Department (HOSPITAL_BASED_OUTPATIENT_CLINIC_OR_DEPARTMENT_OTHER)
Admission: EM | Admit: 2018-06-04 | Discharge: 2018-06-05 | Disposition: A | Discharge status: Home / Self Care | Payer: Medicaid Other | Attending: Emergency Medicine | Admitting: Emergency Medicine

## 2018-06-04 ENCOUNTER — Other Ambulatory Visit: Payer: Self-pay

## 2018-06-04 DIAGNOSIS — R0981 Nasal congestion: Secondary | ICD-10-CM

## 2018-06-04 DIAGNOSIS — J45909 Unspecified asthma, uncomplicated: Secondary | ICD-10-CM | POA: Insufficient documentation

## 2018-06-04 NOTE — ED Triage Notes (Signed)
nasal congestion x4 weeks, reports congested cough. Headaches onset today. Works in [employer].

## 2018-06-05 ENCOUNTER — Encounter (HOSPITAL_BASED_OUTPATIENT_CLINIC_OR_DEPARTMENT_OTHER): Payer: Self-pay | Admitting: Emergency Medicine

## 2018-06-05 MED ORDER — FLUTICASONE PROPIONATE 50 MCG/ACT NA SUSP: 2.0000 | Freq: Every day | NASAL | 0 refills | Status: DC

## 2018-06-05 NOTE — ED Provider Notes (Signed)
[facility] EMERGENCY DEPARTMENT Provider Note   CSN: [id number] Arrival date & time: 06/04/18  2338    History   Chief Complaint Chief Complaint  Patient presents with  . Nasal Congestion    HPI Brianna Byrd is a 23 y.o. female.     The history is provided by the patient.  URI  Presenting symptoms: congestion   Presenting symptoms: no cough, no ear pain, no facial pain, no fatigue and no fever   Severity:  Mild Onset quality:  Gradual Duration:  4 weeks Timing:  Constant Progression:  Unchanged Chronicity:  New Relieved by:  Nothing Worsened by:  Nothing Ineffective treatments:  None tried Associated symptoms: no arthralgias, no headaches, no myalgias, no neck pain, no sinus pain, no sneezing, no swollen glands and no wheezing   Risk factors: not elderly, no chronic cardiac disease, no chronic kidney disease, no chronic respiratory disease, no diabetes mellitus, no immunosuppression, no recent illness and no recent travel   has had nasal congestion for 4 weeks.  No travel exposure.  No f/c/r.    Past Medical History:  Diagnosis Date  . Asthma   . Reflux     Patient Active Problem List   Diagnosis Date Noted  . History of pre-eclampsia 02/12/2017  . SVD (spontaneous vaginal delivery) 12/26/2016    History reviewed. No pertinent surgical history.   OB History    Gravida  1   Para  1   Term  1   Preterm      AB      Living  1     SAB      TAB      Ectopic      Multiple  0   Live Births  1            Home Medications    Prior to Admission medications   Not on File    Family History History reviewed. No pertinent family history.  Social History Social History   Tobacco Use  . Smoking status: Never Smoker  . Smokeless tobacco: Never Used  Substance Use Topics  . Alcohol use: No  . Drug use: No     Allergies   Peach [prunus persica]   Review of Systems Review of Systems  Constitutional: Negative for  fatigue and fever.  HENT: Positive for congestion. Negative for ear pain, sinus pain and sneezing.   Respiratory: Negative for cough, choking, chest tightness, shortness of breath and wheezing.   Gastrointestinal: Negative for abdominal pain and nausea.  Musculoskeletal: Negative for arthralgias, myalgias and neck pain.  Neurological: Negative for headaches.  All other systems reviewed and are negative.    Physical Exam Updated Vital Signs BP 112/70 (BP Location: Right Arm)   Pulse 89   Temp 98.8 F (37.1 C) (Oral)   Resp 16   Ht 5\' 2"  (1.575 m)   Wt 95.3 kg   LMP 03/06/2018 (LMP Unknown)   SpO2 99%   BMI 38.41 kg/m   Physical Exam Vitals signs and nursing note reviewed.  Constitutional:      General: She is not in acute distress.    Appearance: Normal appearance. She is not ill-appearing.  HENT:     Head: Normocephalic and atraumatic.     Nose: No rhinorrhea.     Mouth/Throat:     Mouth: Mucous membranes are moist.     Pharynx: Oropharynx is clear. No oropharyngeal exudate.  Eyes:     Conjunctiva/sclera: Conjunctivae normal.  Pupils: Pupils are equal, round, and reactive to light.  Neck:     Musculoskeletal: Normal range of motion and neck supple.  Cardiovascular:     Rate and Rhythm: Normal rate and regular rhythm.     Pulses: Normal pulses.     Heart sounds: Normal heart sounds.  Pulmonary:     Effort: Pulmonary effort is normal. No respiratory distress.     Breath sounds: Normal breath sounds. No stridor. No wheezing or rhonchi.  Abdominal:     General: Abdomen is flat. Bowel sounds are normal.     Tenderness: There is no abdominal tenderness.     Hernia: No hernia is present.  Musculoskeletal: Normal range of motion.  Skin:    General: Skin is warm and dry.     Capillary Refill: Capillary refill takes less than 2 seconds.     Coloration: Skin is not jaundiced.     Findings: No bruising or lesion.  Neurological:     General: No focal deficit present.      Mental Status: She is alert and oriented to person, place, and time.  Psychiatric:        Mood and Affect: Mood normal.        Behavior: Behavior normal.      ED Treatments / Results  Labs (all labs ordered are listed, but only abnormal results are displayed) Labs Reviewed - No data to display  EKG None  Radiology No results found.  Procedures Procedures (including critical care time)  Medications Ordered in ED Medications - No data to display    Final Clinical Impressions(s) / ED Diagnoses   Final diagnoses:  Nasal congestion   Return for intractable cough, coughing up blood,fevers >100.4 unrelieved by medication, shortness of breath, intractable vomiting, chest pain, shortness of breath, weakness,numbness, changes in speech, facial asymmetry,abdominal pain, passing out,Inability to tolerate liquids or food, cough, altered mental status or any concerns. No signs of systemic illness or infection. The patient is nontoxic-appearing on exam and vital signs are within normal limits.   I have reviewed the triage vital signs and the nursing notes. Pertinent labs &imaging results that were available during my care of the patient were reviewed by me and considered in my medical decision making (see chart for details).  After history, exam, and medical workup I feel the patient has been appropriately medically screened and is safe for discharge home. Pertinent diagnoses were discussed with the patient. Patient was given return precautions.   Khaleelah Yowell, MD 06/05/18 0010

## 2018-08-25 ENCOUNTER — Encounter (HOSPITAL_BASED_OUTPATIENT_CLINIC_OR_DEPARTMENT_OTHER): Payer: Self-pay

## 2018-08-25 ENCOUNTER — Emergency Department (HOSPITAL_BASED_OUTPATIENT_CLINIC_OR_DEPARTMENT_OTHER)
Admission: EM | Admit: 2018-08-25 | Discharge: 2018-08-25 | Disposition: A | Discharge status: Home / Self Care | Payer: Medicaid Other | Attending: Emergency Medicine | Admitting: Emergency Medicine

## 2018-08-25 ENCOUNTER — Other Ambulatory Visit: Payer: Self-pay

## 2018-08-25 DIAGNOSIS — Z760 Encounter for issue of repeat prescription: Secondary | ICD-10-CM | POA: Insufficient documentation

## 2018-08-25 DIAGNOSIS — J45909 Unspecified asthma, uncomplicated: Secondary | ICD-10-CM | POA: Insufficient documentation

## 2018-08-25 DIAGNOSIS — Z79899 Other long term (current) drug therapy: Secondary | ICD-10-CM | POA: Insufficient documentation

## 2018-08-25 DIAGNOSIS — M549 Dorsalgia, unspecified: Secondary | ICD-10-CM | POA: Insufficient documentation

## 2018-08-25 MED ORDER — CYCLOBENZAPRINE HCL 10 MG PO TABS: 10.0000 mg | ORAL_TABLET | Freq: Two times a day (BID) | ORAL | 0 refills | Status: AC | PRN

## 2018-08-25 NOTE — Discharge Instructions (Signed)
Thank you for allowing me to care for you today. Please return to the emergency department if you have new or worsening symptoms. Take your medications as instructed.  ° °

## 2018-08-25 NOTE — ED Provider Notes (Signed)
[facility] EMERGENCY DEPARTMENT Provider Note   CSN: [id number] Arrival date & time: 08/25/18  1803    History   Chief Complaint Chief Complaint  Patient presents with  . Medication Refill    HPI Brianna Byrd is a 23 y.o. female.     Patient is a 23 year old female with past medical history of asthma presenting to the emergency department for "medication refill".  Patient reports that last year she pulled a muscle in her back and was given a muscle relaxant which is very helpful.  Reports that she pulled a muscle in her back again and would like to have a refill of this medication.  Reports she does not have a primary care doctor any follow-up.  Denies any numbness, tingling, weakness, saddle anesthesia, fever, chills.  Has tried over-the-counter medications without relief     Past Medical History:  Diagnosis Date  . Asthma   . Reflux     Patient Active Problem List   Diagnosis Date Noted  . History of pre-eclampsia 02/12/2017  . SVD (spontaneous vaginal delivery) 12/26/2016    History reviewed. No pertinent surgical history.   OB History    Gravida  1   Para  1   Term  1   Preterm      AB      Living  1     SAB      TAB      Ectopic      Multiple  0   Live Births  1            Home Medications    Prior to Admission medications   Medication Sig Start Date End Date Taking? Authorizing Provider  cyclobenzaprine (FLEXERIL) 10 MG tablet Take 1 tablet (10 mg total) by mouth 2 (two) times daily as needed for up to 7 days for muscle spasms. 08/25/18 09/01/18  Madilyn Hook A, PA-C  fluticasone (FLONASE) 50 MCG/ACT nasal spray Place 2 sprays into both nostrils daily. 06/05/18   Palumbo, April, MD    Family History No family history on file.  Social History Social History   Tobacco Use  . Smoking status: Never Smoker  . Smokeless tobacco: Never Used  Substance Use Topics  . Alcohol use: No  . Drug use: No     Allergies    Peach [prunus persica]   Review of Systems Review of Systems  Constitutional: Negative for appetite change and fever.  HENT: Negative for congestion.   Respiratory: Negative for cough and shortness of breath.   Cardiovascular: Negative for chest pain.  Gastrointestinal: Negative for abdominal pain, nausea and vomiting.  Musculoskeletal: Positive for back pain. Negative for arthralgias, myalgias, neck pain and neck stiffness.  Skin: Negative for rash and wound.  Neurological: Negative for dizziness, light-headedness, numbness and headaches.     Physical Exam Updated Vital Signs BP 108/77 (BP Location: Left Arm)   Pulse (!) 101   Temp 98.6 F (37 C) (Oral)   Resp 18   Ht 5\' 1"  (1.549 m)   Wt 94.8 kg   LMP  (LMP Unknown)   SpO2 100%   BMI 39.49 kg/m   Physical Exam Vitals signs and nursing note reviewed.  Constitutional:      General: She is not in acute distress.    Appearance: Normal appearance. She is obese. She is not toxic-appearing or diaphoretic.  HENT:     Head: Normocephalic.  Eyes:     Conjunctiva/sclera: Conjunctivae normal.  Pulmonary:  Effort: Pulmonary effort is normal.  Musculoskeletal:       Arms:     Right lower leg: No edema.     Left lower leg: No edema.  Skin:    General: Skin is dry.  Neurological:     Mental Status: She is alert.  Psychiatric:        Mood and Affect: Mood normal.      ED Treatments / Results  Labs (all labs ordered are listed, but only abnormal results are displayed) Labs Reviewed - No data to display  EKG None  Radiology No results found.  Procedures Procedures (including critical care time)  Medications Ordered in ED Medications - No data to display   Initial Impression / Assessment and Plan / ED Course  I have reviewed the triage vital signs and the nursing notes.  Pertinent labs & imaging results that were available during my care of the patient were reviewed by me and considered in my medical  decision making (see chart for details).         Clinical Impression: 1. Medication refill     Disposition: Discharge  Prior to providing a prescription for a controlled substance, I independently reviewed the patient's recent prescription history on the [place] Controlled Substance Reporting System. The patient had no recent or regular prescriptions and was deemed appropriate for a brief, less than 3 day prescription of narcotic for acute analgesia.  This note was prepared with assistance of Conservation officer, historic buildingsDragon voice recognition software. Occasional wrong-word or sound-a-like substitutions may have occurred due to the inherent limitations of voice recognition software.   Final Clinical Impressions(s) / ED Diagnoses   Final diagnoses:  Medication refill    ED Discharge Orders         Ordered    cyclobenzaprine (FLEXERIL) 10 MG tablet  2 times daily PRN     08/25/18 1903           Arlyn DunningMcLean, Samyria Rudie A, PA-C 08/25/18 Anselm [id number]    Floyd, Dan, DO 08/25/18 2306

## 2018-08-25 NOTE — ED Triage Notes (Signed)
Pt requesting refill of muscle relaxer for back pain-NAD-steady gait

## 2018-08-25 NOTE — ED Notes (Signed)
Wants a muscle relaxant refill for bac from may 18 of last year  Denies pain at present

## 2018-12-26 ENCOUNTER — Emergency Department (HOSPITAL_BASED_OUTPATIENT_CLINIC_OR_DEPARTMENT_OTHER)
Admission: EM | Admit: 2018-12-26 | Discharge: 2018-12-26 | Disposition: A | Discharge status: Home / Self Care | Payer: Medicaid Other | Attending: Emergency Medicine | Admitting: Emergency Medicine

## 2018-12-26 ENCOUNTER — Other Ambulatory Visit: Payer: Self-pay

## 2018-12-26 ENCOUNTER — Encounter (HOSPITAL_BASED_OUTPATIENT_CLINIC_OR_DEPARTMENT_OTHER): Payer: Self-pay | Admitting: Emergency Medicine

## 2018-12-26 DIAGNOSIS — R300 Dysuria: Secondary | ICD-10-CM | POA: Insufficient documentation

## 2018-12-26 DIAGNOSIS — Z91018 Allergy to other foods: Secondary | ICD-10-CM | POA: Insufficient documentation

## 2018-12-26 DIAGNOSIS — N390 Urinary tract infection, site not specified: Secondary | ICD-10-CM

## 2018-12-26 DIAGNOSIS — J45909 Unspecified asthma, uncomplicated: Secondary | ICD-10-CM | POA: Insufficient documentation

## 2018-12-26 LAB — PREGNANCY, URINE: Preg Test, Ur: NEGATIVE

## 2018-12-26 LAB — URINALYSIS, MICROSCOPIC (REFLEX)

## 2018-12-26 LAB — URINALYSIS, ROUTINE W REFLEX MICROSCOPIC
Bilirubin Urine: NEGATIVE
Glucose, UA: NEGATIVE mg/dL
Ketones, ur: 15 mg/dL — AB
Nitrite: NEGATIVE
Protein, ur: 30 mg/dL — AB
Specific Gravity, Urine: 1.025 (ref 1.005–1.030)
pH: 6 (ref 5.0–8.0)

## 2018-12-26 MED ORDER — NITROFURANTOIN MONOHYD MACRO 100 MG PO CAPS
ORAL_CAPSULE | ORAL | Status: AC
  Filled 2018-12-26: qty 1

## 2018-12-26 MED ORDER — NITROFURANTOIN MONOHYD MACRO 100 MG PO CAPS
100.0000 mg | ORAL_CAPSULE | Freq: Once | ORAL | Status: AC
  Administered 2018-12-26: 01:00:00 100 mg via ORAL

## 2018-12-26 MED ORDER — PHENAZOPYRIDINE HCL 100 MG PO TABS
ORAL_TABLET | ORAL | Status: AC
  Filled 2018-12-26: qty 2

## 2018-12-26 MED ORDER — NITROFURANTOIN MONOHYD MACRO 100 MG PO CAPS: 100.0000 mg | ORAL_CAPSULE | Freq: Two times a day (BID) | ORAL | 0 refills | Status: DC

## 2018-12-26 MED ORDER — PHENAZOPYRIDINE HCL 100 MG PO TABS
200.0000 mg | ORAL_TABLET | ORAL | Status: AC
  Administered 2018-12-26: 200 mg via ORAL

## 2018-12-26 MED ORDER — ACETAMINOPHEN 500 MG PO TABS
ORAL_TABLET | ORAL | Status: AC
  Filled 2018-12-26: qty 2

## 2018-12-26 MED ORDER — PHENAZOPYRIDINE HCL 200 MG PO TABS: 200.0000 mg | ORAL_TABLET | Freq: Three times a day (TID) | ORAL | 0 refills | Status: DC

## 2018-12-26 MED ORDER — ACETAMINOPHEN 500 MG PO TABS
1000.0000 mg | ORAL_TABLET | Freq: Once | ORAL | Status: AC
  Administered 2018-12-26: 1000 mg via ORAL

## 2018-12-26 NOTE — ED Provider Notes (Signed)
[facility] EMERGENCY DEPARTMENT Provider Note   CSN: [id number] Arrival date & time: 12/26/18  0046     History   Chief Complaint Chief Complaint  Patient presents with  . Dysuria    HPI Brianna Byrd is a 23 y.o. female.     The history is provided by the patient.  Dysuria Pain quality:  Burning Pain severity:  Moderate Onset quality:  Gradual Duration:  5 days Timing:  Constant Progression:  Unchanged Chronicity:  Recurrent Recent urinary tract infections: no   Relieved by:  Nothing Worsened by:  Nothing Ineffective treatments:  Cranberry juice Urinary symptoms: no discolored urine and no bladder incontinence   Associated symptoms: no abdominal pain, no fever, no flank pain, no genital lesions, no nausea, no vaginal discharge and no vomiting   Risk factors: no hx of pyelonephritis and no renal disease     Past Medical History:  Diagnosis Date  . Asthma   . Reflux     Patient Active Problem List   Diagnosis Date Noted  . History of pre-eclampsia 02/12/2017  . SVD (spontaneous vaginal delivery) 12/26/2016    History reviewed. No pertinent surgical history.   OB History    Gravida  1   Para  1   Term  1   Preterm      AB      Living  1     SAB      TAB      Ectopic      Multiple  0   Live Births  1            Home Medications    Prior to Admission medications   Medication Sig Start Date End Date Taking? Authorizing Provider  fluticasone (FLONASE) 50 MCG/ACT nasal spray Place 2 sprays into both nostrils daily. 06/05/18   Greenley Martone, MD    Family History No family history on file.  Social History Social History   Tobacco Use  . Smoking status: Never Smoker  . Smokeless tobacco: Never Used  Substance Use Topics  . Alcohol use: No  . Drug use: No     Allergies   Peach [prunus persica]   Review of Systems Review of Systems  Constitutional: Negative for fever.  HENT: Negative for congestion.    Eyes: Negative for visual disturbance.  Respiratory: Negative for cough.   Gastrointestinal: Negative for abdominal pain, nausea and vomiting.  Genitourinary: Positive for dysuria. Negative for flank pain and vaginal discharge.  Musculoskeletal: Negative for arthralgias.  Neurological: Negative for dizziness.  Psychiatric/Behavioral: Negative for agitation.  All other systems reviewed and are negative.    Physical Exam Updated Vital Signs BP 116/75 (BP Location: Right Arm)   Pulse 99   Temp 98.5 F (36.9 C) (Oral)   Resp 16   Ht 5\' 2"  (1.575 m)   Wt 87.1 kg   SpO2 100%   BMI 35.12 kg/m   Physical Exam Vitals signs and nursing note reviewed.  Constitutional:      General: She is not in acute distress.    Appearance: Normal appearance.  HENT:     Head: Normocephalic and atraumatic.     Nose: Nose normal.  Eyes:     Conjunctiva/sclera: Conjunctivae normal.     Pupils: Pupils are equal, round, and reactive to light.  Neck:     Musculoskeletal: Normal range of motion and neck supple.  Cardiovascular:     Rate and Rhythm: Normal rate and regular rhythm.  Pulses: Normal pulses.     Heart sounds: Normal heart sounds.  Pulmonary:     Effort: Pulmonary effort is normal.     Breath sounds: Normal breath sounds.  Abdominal:     General: Abdomen is flat. Bowel sounds are normal.     Tenderness: There is no abdominal tenderness. There is no guarding or rebound.  Musculoskeletal: Normal range of motion.  Skin:    General: Skin is warm and dry.     Capillary Refill: Capillary refill takes less than 2 seconds.  Neurological:     General: No focal deficit present.     Mental Status: She is alert and oriented to person, place, and time.  Psychiatric:        Mood and Affect: Mood normal.        Behavior: Behavior normal.      ED Treatments / Results  Labs (all labs ordered are listed, but only abnormal results are displayed) Labs Reviewed  PREGNANCY, URINE   URINALYSIS, ROUTINE W REFLEX MICROSCOPIC    EKG None  Radiology No results found.  Procedures Procedures (including critical care time)  Medications Ordered in ED Medications  nitrofurantoin (macrocrystal-monohydrate) (MACROBID) capsule 100 mg (has no administration in time range)  phenazopyridine (PYRIDIUM) tablet 200 mg (has no administration in time range)  nitrofurantoin (macrocrystal-monohydrate) (MACROBID) 100 MG capsule (has no administration in time range)  phenazopyridine (PYRIDIUM) 100 MG tablet (has no administration in time range)  acetaminophen (TYLENOL) tablet 1,000 mg (1,000 mg Oral Given 12/26/18 0110)      Symptoms consistent with UTI.  No GYN complaints.  No f/c/r.  No indication for labs or imaging at this time.  Treat with macrobid for its low resistance in the community and favorable side effects profile.   Brianna Byrd was evaluated in Emergency Department on 12/26/2018 for the symptoms described in the history of present illness. She was evaluated in the context of the global COVID-19 pandemic, which necessitated consideration that the patient might be at risk for infection with the SARS-CoV-2 virus that causes COVID-19. Institutional protocols and algorithms that pertain to the evaluation of patients at risk for COVID-19 are in a state of rapid change based on information released by regulatory bodies including the CDC and federal and state organizations. These policies and algorithms were followed during the patient's care in the ED.   Final Clinical Impressions(s) / ED Diagnoses   Return for intractable cough, coughing up blood,fevers >100.4 unrelieved by medication, shortness of breath, intractable vomiting, chest pain, shortness of breath, weakness,numbness, changes in speech, facial asymmetry,abdominal pain, passing out,Inability to tolerate liquids or food, cough, altered mental status or any concerns. No signs of systemic illness or infection.  The patient is nontoxic-appearing on exam and vital signs are within normal limits.   I have reviewed the triage vital signs and the nursing notes. Pertinent labs &imaging results that were available during my care of the patient were reviewed by me and considered in my medical decision making (see chart for details).After history, exam, and medical workup I feel the patient has beenappropriately medically screened and is safe for discharge home. Pertinent diagnoses were discussed with the patient. Patient was given return precautions.    Mcgwire Dasaro, MD 12/26/18 [id number]

## 2018-12-26 NOTE — ED Triage Notes (Signed)
Patient presents with complaints of urinary frequency, burning and spasms; states onset 5 days ago with lower back pain. Denies hematuria.

## 2018-12-26 NOTE — ED Notes (Signed)
EDP at bedside  

## 2019-01-27 ENCOUNTER — Ambulatory Visit (INDEPENDENT_AMBULATORY_CARE_PROVIDER_SITE_OTHER): Payer: No Typology Code available for payment source | Admitting: Advanced Practice Midwife

## 2019-01-27 ENCOUNTER — Encounter: Payer: Self-pay | Admitting: Advanced Practice Midwife

## 2019-01-27 ENCOUNTER — Other Ambulatory Visit: Payer: Self-pay

## 2019-01-27 VITALS — BP 109/76 | HR 92 | Ht 61.0 in | Wt 210.0 lb

## 2019-01-27 DIAGNOSIS — N6320 Unspecified lump in the left breast, unspecified quadrant: Secondary | ICD-10-CM | POA: Diagnosis not present

## 2019-01-27 NOTE — Patient Instructions (Signed)
Breast Ultrasound  Breast ultrasound, or breast ultrasonogram, is a procedure that is used to check for breast problems. A breast ultrasound uses harmless sound waves and a computer to create pictures of the inside of your breast. This procedure can help determine whether a lump in your breast is a fluid-filled sac (cyst), a solid tumor, or a growth. It can also help determine whether a tumor is cancerous (malignant) or noncancerous (benign). Sometimes a breast ultrasound is done to locate nodules in the breast that are too small to feel but need to be removed during surgery. A breast ultrasound takes about 30 minutes. Tell a health care provider about:  Any allergies you have.  All medicines you are taking, including vitamins, herbs, eye drops, creams, and over-the-counter medicines.  Any blood disorders you have.  Any surgeries you have had.  Any medical conditions you have. What are the risks? Breast ultrasound is safe and painless. Ultrasound imaging does not use X-rays. There are no known risks for this procedure. What happens before the procedure? You do not need to prepare for a breast ultrasound. You will undress from your waist up, so wear loose, comfortable clothing on the day of the procedure. What happens during the procedure?  You will lie down on an examining table.  A health care provider will apply gel to your breast area.  You may be asked to hold your arm above your head.  The health care provider will move a handheld probe, which looks like a microphone, back and forth over your breast.  The probe will send signals to a computer that will create images of your breast.  When the procedure is over, the gel will be cleaned from your breast, and you can get dressed. What happens after the procedure?  You can go home right away and do all of your usual activities.  It is up to you to get the results of your procedure. Ask your health care provider, or the department  that is doing the procedure, when your results will be ready. Summary  A breast ultrasound is a procedure that is used to check for breast problems.  This procedure uses harmless sound waves and a computer to create pictures of the inside of your breast.  This is a safe procedure. This information is not intended to replace advice given to you by your health care provider. Make sure you discuss any questions you have with your health care provider. Document Released: 03/27/2004 Document Revised: 02/15/2017 Document Reviewed: 11/07/2015 Elsevier Patient Education  2020 [employer] Breast Cyst  A breast cyst is a sac in the breast that is filled with fluid. Breast cysts are usually noncancerous (benign). They are common among women, and they are most often located in the upper, outer portion of the breast. One or more cysts may develop. They form when fluid builds up inside of the breast glands. There are several types of breast cysts:  Macrocyst. This is a cyst that is about 2 inches (5.1 cm) across (in diameter).  Microcyst. This is a very small cyst that you cannot feel, but it can be seen with imaging tests such as an X-ray of the breast (mammogram) or ultrasound.  Galactocele. This is a cyst that contains milk. It may develop if you suddenly stop breastfeeding. Breast cysts do not increase your risk of breast cancer. They usually disappear after menopause, unless you take artificial hormones (are on hormone therapy). What are the causes? The exact cause of  breast cysts is not known. Possible causes include:  Blockage of tubes (ducts) in the breast glands, which leads to fluid buildup. Duct blockage may result from: ? Fibrocystic breast changes. This is a common, benign condition that occurs when women go through hormonal changes during the menstrual cycle. This is a common cause of multiple breast cysts. ? Overgrowth of breast tissue or breast glands. ? Scar tissue in the breast  from previous surgery.  Changes in certain female hormones (estrogen and progesterone). What increases the risk? You may be more likely to develop breast cysts if you have not gone through menopause. What are the signs or symptoms? Symptoms of a breast cyst may include:  Feeling one or more smooth, round, soft lumps (like grapes) in the breast that are easily moveable. The lump(s) may get bigger and more painful before your period and get smaller after your period.  Breast discomfort or pain. How is this diagnosed? A cyst can be felt during a physical exam by your health care provider. A mammogram and ultrasound will be done to confirm the diagnosis. Fluid may be removed from the cyst with a needle (fine-needle aspiration) and tested to make sure the cyst is not cancerous. How is this treated? Treatment may not be necessary. Your health care provider may monitor the cyst to see if it goes away on its own. If the cyst is uncomfortable or gets bigger, or if you do not like how the cyst makes your breast look, you may need treatment. Treatment may include:  Hormone treatment.  Fine-needle aspiration, to drain fluid from the cyst. There is a chance of the cyst coming back (recurring) after aspiration.  Surgery to remove the cyst. Follow these instructions at home:  See your health care provider regularly. ? Get a yearly physical exam. ? If you are 82-85 years old, get a clinical breast exam every 1-3 years. After age 45, get this exam every year. ? Get mammograms as often as directed.  Do a breast self-exam every month, or as often as directed. Having many breast cysts, or "lumpy" breasts, may make it harder to feel for new lumps. Understand how your breasts normally look and feel, and write down any changes in your breasts so you can tell your health care provider about the changes. A breast self-exam involves: ? Comparing your breasts in the mirror. ? Looking for visible changes in your  skin or nipples. ? Feeling for lumps or changes.  Take over-the-counter and prescription medicines only as told by your health care provider.  Wear a supportive bra, especially when exercising.  Follow instructions from your health care provider about eating and drinking restrictions. ? Avoid caffeine. ? Cut down on salt (sodium) in what you eat and drink, especially before your menstrual period. Too much sodium can cause fluid buildup (retention), breast swelling, and discomfort.  Keep all follow-up visits as told your health care provider. This is important. Contact a health care provider if:  You feel, or think you feel, a lump in your breast.  You notice that both breasts look or feel different than usual.  Your breast is still causing pain after your menstrual period is over.  You find new lumps or bumps that were not there before.  You feel lumps in your armpit (axilla). Get help right away if:  You have severe pain, tenderness, redness, or warmth in your breast.  You have fluid or blood leaking from your nipple.  Your breast lump becomes  hard and painful.  You notice dimpling or wrinkling of the breast or nipple. This information is not intended to replace advice given to you by your health care provider. Make sure you discuss any questions you have with your health care provider. Document Released: 03/05/2005 Document Revised: 02/15/2017 Document Reviewed: 11/25/2015 Elsevier Patient Education  2020 [employer].

## 2019-01-27 NOTE — Progress Notes (Signed)
   Subjective:    Patient ID: Brianna Byrd, female    DOB: 1995-06-19, 23 y.o.   MRN: [id number]  Other This is a new (Left breast mass) problem. The current episode started 1 to 4 weeks ago. The problem has been unchanged. Pertinent negatives include no abdominal pain, chills, coughing, fever, myalgias, nausea or swollen glands. Associated symptoms comments: Painful left breast mass upper outer. Exacerbated by: palpation. She has tried nothing for the symptoms.      Review of Systems  Constitutional: Negative for chills and fever.  Respiratory: Negative for cough.        Left breast mass   Gastrointestinal: Negative for abdominal pain and nausea.  Musculoskeletal: Negative for myalgias.       Objective:   Physical Exam Constitutional:      General: She is not in acute distress.    Appearance: Normal appearance. She is obese. She is not ill-appearing or toxic-appearing.  HENT:     Head: Normocephalic.  Neck:     Musculoskeletal: Normal range of motion and neck supple. No neck rigidity.  Cardiovascular:     Rate and Rhythm: Normal rate.  Pulmonary:     Effort: Pulmonary effort is normal.     Comments: Mobile tender 1.5cm deep left breast mass About 2:00 position Started about 2 weeks ago Musculoskeletal: Normal range of motion.  Skin:    General: Skin is warm and dry.  Neurological:     General: No focal deficit present.     Mental Status: She is alert.           Assessment & Plan:  A:   Left Breast mass  P:   Discussed possible etiologies, likely cyst        Reviewed imaging, recommend Breast US        [place] left breast ordered        Followup as needed

## 2019-01-27 NOTE — Progress Notes (Signed)
Patient states that she has been having left sided breast pain and tenderness. Patient states she has done a self breast exam and felt lump on left side. Kathrene Alu RN

## 2019-02-04 ENCOUNTER — Other Ambulatory Visit: Payer: Self-pay

## 2019-02-04 ENCOUNTER — Ambulatory Visit
Admission: RE | Admit: 2019-02-04 | Discharge: 2019-02-04 | Disposition: A | Discharge status: Home / Self Care | Payer: No Typology Code available for payment source | Source: Ambulatory Visit | Attending: Advanced Practice Midwife | Admitting: Advanced Practice Midwife

## 2019-02-04 DIAGNOSIS — N6320 Unspecified lump in the left breast, unspecified quadrant: Secondary | ICD-10-CM

## 2019-02-25 ENCOUNTER — Ambulatory Visit: Payer: Medicaid Other | Admitting: Family

## 2019-02-26 ENCOUNTER — Telehealth: Payer: No Typology Code available for payment source

## 2019-02-26 ENCOUNTER — Encounter (HOSPITAL_BASED_OUTPATIENT_CLINIC_OR_DEPARTMENT_OTHER): Payer: Self-pay | Admitting: *Deleted

## 2019-02-26 ENCOUNTER — Other Ambulatory Visit: Payer: Self-pay

## 2019-02-26 ENCOUNTER — Emergency Department (HOSPITAL_BASED_OUTPATIENT_CLINIC_OR_DEPARTMENT_OTHER)
Admission: EM | Admit: 2019-02-26 | Discharge: 2019-02-26 | Discharge status: Against Medical Advice | Payer: No Typology Code available for payment source | Attending: Emergency Medicine | Admitting: Emergency Medicine

## 2019-02-26 DIAGNOSIS — N939 Abnormal uterine and vaginal bleeding, unspecified: Secondary | ICD-10-CM | POA: Insufficient documentation

## 2019-02-26 DIAGNOSIS — Z91018 Allergy to other foods: Secondary | ICD-10-CM | POA: Diagnosis not present

## 2019-02-26 DIAGNOSIS — Z5321 Procedure and treatment not carried out due to patient leaving prior to being seen by health care provider: Secondary | ICD-10-CM | POA: Insufficient documentation

## 2019-02-26 DIAGNOSIS — J45909 Unspecified asthma, uncomplicated: Secondary | ICD-10-CM | POA: Insufficient documentation

## 2019-02-26 LAB — URINALYSIS, MICROSCOPIC (REFLEX): WBC, UA: NONE SEEN WBC/hpf (ref 0–5)

## 2019-02-26 LAB — URINALYSIS, ROUTINE W REFLEX MICROSCOPIC
Bilirubin Urine: NEGATIVE
Glucose, UA: NEGATIVE mg/dL
Ketones, ur: NEGATIVE mg/dL
Leukocytes,Ua: NEGATIVE
Nitrite: NEGATIVE
Protein, ur: NEGATIVE mg/dL
Specific Gravity, Urine: 1.015 (ref 1.005–1.030)
pH: 7.5 (ref 5.0–8.0)

## 2019-02-26 LAB — PREGNANCY, URINE: Preg Test, Ur: NEGATIVE

## 2019-02-26 NOTE — ED Triage Notes (Signed)
Pt  c/o vaginal spotting x 3 weeks

## 2019-02-26 NOTE — ED Provider Notes (Signed)
[facility] EMERGENCY DEPARTMENT Provider Note  CSN: [id number] Arrival date & time: 02/26/19 0021  Chief Complaint(s) Vaginal Bleeding  HPI Brianna Byrd is a 23 y.o. female presented with vaginal bleeding.  Last menstrual period was November 20.  Patient reports that she had her regular cycle but has been spotting and passing small clots.  Patient is sexually active.  Denies any vaginal discharge.  Endorses intermittent pelvic discomfort when passing clots.  No current pain at this time.  Not on OCPs.  Denies any other physical complaints.  HPI  Past Medical History Past Medical History:  Diagnosis Date  . Asthma   . Reflux    Patient Active Problem List   Diagnosis Date Noted  . Left breast mass 01/27/2019  . History of pre-eclampsia 02/12/2017  . SVD (spontaneous vaginal delivery) 12/26/2016   Home Medication(s) Prior to Admission medications   Medication Sig Start Date End Date Taking? Authorizing Provider  fluticasone (FLONASE) 50 MCG/ACT nasal spray Place 2 sprays into both nostrils daily. Patient not taking: Reported on 01/27/2019 06/05/18   Palumbo, April, MD  nitrofurantoin, macrocrystal-monohydrate, (MACROBID) 100 MG capsule Take 1 capsule (100 mg total) by mouth 2 (two) times daily. X 7 days Patient not taking: Reported on 01/27/2019 12/26/18   Palumbo, April, MD  phenazopyridine (PYRIDIUM) 200 MG tablet Take 1 tablet (200 mg total) by mouth 3 (three) times daily. Patient not taking: Reported on 01/27/2019 12/26/18   Randal Buba April, MD                                                                                                                                    Past Surgical History History reviewed. No pertinent surgical history. Family History Family History  Problem Relation Age of Onset  . Diabetes Father   . Hypertension Mother   . Diabetes Sister   . Cancer Neg Hx     Social History Social History   Tobacco Use  . Smoking status:  Never Smoker  . Smokeless tobacco: Never Used  Substance Use Topics  . Alcohol use: No  . Drug use: No   Allergies Peach [prunus persica]  Review of Systems Review of Systems All other systems are reviewed and are negative for acute change except as noted in the HPI  Physical Exam Vital Signs  I have reviewed the triage vital signs BP 125/84   Pulse 86   Temp 98.4 F (36.9 C)   Resp 18   Ht 5\' 2"  (1.575 m)   Wt 89.4 kg   LMP 02/06/2019   SpO2 100%   BMI 36.03 kg/m  Physical Exam Vitals reviewed.  Constitutional:      General: She is not in acute distress.    Appearance: She is well-developed. She is not diaphoretic.  HENT:     Head: Normocephalic and atraumatic.     Right Ear: External ear normal.     Left  Ear: External ear normal.     Nose: Nose normal.  Eyes:     General: No scleral icterus.    Conjunctiva/sclera: Conjunctivae normal.  Neck:     Trachea: Phonation normal.  Cardiovascular:     Rate and Rhythm: Normal rate and regular rhythm.  Pulmonary:     Effort: Pulmonary effort is normal. No respiratory distress.     Breath sounds: No stridor.  Abdominal:     General: There is no distension.     Tenderness: There is no abdominal tenderness.  Musculoskeletal:        General: Normal range of motion.     Cervical back: Normal range of motion.  Neurological:     Mental Status: She is alert and oriented to person, place, and time.  Psychiatric:        Behavior: Behavior normal.     ED Results and Treatments Labs (all labs ordered are listed, but only abnormal results are displayed) Labs Reviewed  URINALYSIS, ROUTINE W REFLEX MICROSCOPIC - Abnormal; Notable for the following components:      Result Value   Hgb urine dipstick TRACE (*)    All other components within normal limits  URINALYSIS, MICROSCOPIC (REFLEX) - Abnormal; Notable for the following components:   Bacteria, UA RARE (*)    All other components within normal limits  PREGNANCY, URINE                                                                                                                          EKG  EKG Interpretation  Date/Time:    Ventricular Rate:    PR Interval:    QRS Duration:   QT Interval:    QTC Calculation:   R Axis:     Text Interpretation:        Radiology No results found.  Pertinent labs & imaging results that were available during my care of the patient were reviewed by me and considered in my medical decision making (see chart for details).  Medications Ordered in ED Medications - No data to display                                                                                                                                  Procedures Procedures  (including critical care time)  Medical Decision Making / ED Course I have reviewed the nursing notes for this encounter and  the patient's prior records (if available in EHR or on provided paperwork).   [name] was evaluated in Emergency Department on 02/26/2019 for the symptoms described in the history of present illness. She was evaluated in the context of the global COVID-19 pandemic, which necessitated consideration that the patient might be at risk for infection with the SARS-CoV-2 virus that causes COVID-19. Institutional protocols and algorithms that pertain to the evaluation of patients at risk for COVID-19 are in a state of rapid change based on information released by regulatory bodies including the CDC and federal and state organizations. These policies and algorithms were followed during the patient's care in the ED.  Patient presented with vaginal bleeding.  Abdomen benign.  UPT negative.  UA without evidence of infection.  Patient left AMA prior to performing pelvic exam.       Final Clinical Impression(s) / ED Diagnoses Final diagnoses:  Vaginal bleeding      This chart was dictated using voice recognition software.  Despite best efforts to proofread,   errors can occur which can change the documentation meaning.   Nira Conn, MD 02/26/19 [phone]

## 2019-03-04 ENCOUNTER — Encounter: Payer: Self-pay | Admitting: Family Medicine

## 2019-03-04 ENCOUNTER — Other Ambulatory Visit: Payer: Self-pay

## 2019-03-04 ENCOUNTER — Ambulatory Visit (INDEPENDENT_AMBULATORY_CARE_PROVIDER_SITE_OTHER): Payer: No Typology Code available for payment source | Admitting: Family Medicine

## 2019-03-04 DIAGNOSIS — G43009 Migraine without aura, not intractable, without status migrainosus: Secondary | ICD-10-CM

## 2019-03-04 DIAGNOSIS — M545 Low back pain, unspecified: Secondary | ICD-10-CM

## 2019-03-04 DIAGNOSIS — N939 Abnormal uterine and vaginal bleeding, unspecified: Secondary | ICD-10-CM

## 2019-03-04 MED ORDER — SUMATRIPTAN SUCCINATE 100 MG PO TABS: 100.0000 mg | ORAL_TABLET | ORAL | 0 refills | Status: DC | PRN

## 2019-03-04 MED ORDER — MEDROXYPROGESTERONE ACETATE 10 MG PO TABS: 10.0000 mg | ORAL_TABLET | Freq: Every day | ORAL | 0 refills | Status: DC

## 2019-03-04 MED ORDER — CYCLOBENZAPRINE HCL 10 MG PO TABS: 5.0000 mg | ORAL_TABLET | Freq: Three times a day (TID) | ORAL | 0 refills | Status: DC | PRN

## 2019-03-04 NOTE — Progress Notes (Signed)
CC: Est care     New Patient Visit SUBJECTIVE: HPI: Brianna Byrd is an 23 y.o.female who is being seen for establishing care. Due to COVID-19 pandemic, we are interacting via web portal for an electronic face-to-face visit. I verified patient's ID using 2 identifiers. Patient agreed to proceed with visit via this method. Patient is at home, I am at office. Patient and I are present for visit.   Headaches HA's for 1 mo. Affecting both sides, lasting for around a day at time, getting daily. Describes as 10/10 severity. She does get sensitive to light/sound. No hx of migraines. +famhx of migraines. Ibuprofen, Aleve, Tylenol, heat are not helping. Seems to be a/w her cycles. Unfortunately, she has been bleeding daily for the past month since this started.   Vag bleeding Has been spotting for past month. Assoc R sided abd pain. No inj or med changes. No recent weight changes. Prior to this, she had not had a cycle for 9 mo. Has been going through around 6 pads daily. She was/is not on hormonal contraception. Went to ED on 12/10 for this issue and left after leaving a urine sample that showed she is not pregnant and has no infection. Denies urinary complaints.  Low back pain Pt has had issues with low back pain w standing in the ED. She has been on Flexeril in the past and that has worked well. She did not get drowsy while taking it. She is not pregnant. Has been using ibuprofen and Tylenol.   Past Medical History:  Diagnosis Date  . Asthma   . Reflux    Past Surgical History:  Procedure Laterality Date  . NO PAST SURGERIES       Family History  Problem Relation Age of Onset  . Diabetes Father   . Hypertension Mother   . Diabetes Sister   . Cancer Neg Hx    Allergies  Allergen Reactions  . Peach [Prunus Persica] Swelling   Current Outpatient Medications:  .  fluticasone (FLONASE) 50 MCG/ACT nasal spray, Place 2 sprays into both nostrils daily. (Patient not taking: Reported on  01/27/2019), Disp: 16 g, Rfl: 0  Patient's last menstrual period was 02/06/2019.  ROS Cardiovascular: Denies chest pain  Respiratory: Denies dyspnea   OBJECTIVE: No conversational dyspnea Age appropriate judgment and insight Nml affect and mood  ASSESSMENT/PLAN: Migraine without aura and without status migrainosus, not intractable - Plan: SUMAtriptan (IMITREX) 100 MG tablet  Abnormal uterine bleeding (AUB) - Plan: medroxyPROGESTERone (PROVERA) 10 MG tablet  Acute bilateral low back pain, unspecified whether sciatica present - Plan: cyclobenzaprine (FLEXERIL) 10 MG tablet  1- Menstrual migraines. Will send in triptan, see if we can control #2. 2- 7 d course progesterone. Expect withdrawal bleed. If irreg bleeding persists, will consider longer term hormonal tx.  3- Refill Flexeril. Cont NSAIDs prn.  Patient should return in 1 mo for CPE. Total time 30 min.  The patient voiced understanding and agreement to the plan.   Wisner, DO 03/04/19  9:47 AM

## 2019-03-27 ENCOUNTER — Other Ambulatory Visit: Payer: Self-pay

## 2019-03-27 ENCOUNTER — Encounter: Payer: Self-pay | Admitting: Family Medicine

## 2019-03-27 ENCOUNTER — Ambulatory Visit (INDEPENDENT_AMBULATORY_CARE_PROVIDER_SITE_OTHER): Payer: No Typology Code available for payment source | Admitting: Family Medicine

## 2019-03-27 VITALS — BP 111/70 | HR 102 | Ht 61.0 in | Wt 211.0 lb

## 2019-03-27 DIAGNOSIS — N939 Abnormal uterine and vaginal bleeding, unspecified: Secondary | ICD-10-CM | POA: Diagnosis not present

## 2019-03-27 MED ORDER — NORETHINDRONE 0.35 MG PO TABS: 1.0000 | ORAL_TABLET | Freq: Every day | ORAL | 3 refills | Status: DC

## 2019-03-27 MED ORDER — FERROUS GLUCONATE 324 (38 FE) MG PO TABS: 324.0000 mg | ORAL_TABLET | Freq: Two times a day (BID) | ORAL | 3 refills | Status: DC

## 2019-03-27 NOTE — Progress Notes (Signed)
   Subjective:    Patient ID: Brianna Byrd, female    DOB: [birth year], 24 y.o.   MRN: [id number]  HPI Patient seen for 2 months of heavy continuous bleeding with blood clots. Was seen in ED, prescribed provera, but did not take. Has dizziness and lightheadedness with standing. Bleeding decreased a couple of days ago, only spotting now.  Family history of women with fibroids. Has history of migraines.   Review of Systems     Objective:   Physical Exam Exam conducted with a chaperone present.  Constitutional:      Appearance: Normal appearance.  Cardiovascular:     Rate and Rhythm: Normal rate.  Pulmonary:     Effort: Pulmonary effort is normal.  Abdominal:     Hernia: There is no hernia in the left inguinal area or right inguinal area.  Genitourinary:    Labia:        Right: No rash, tenderness or lesion.        Left: No rash, tenderness or lesion.      Vagina: Normal. No signs of injury and foreign body. No vaginal discharge, erythema, tenderness or bleeding.     Cervix: No cervical motion tenderness, friability, erythema or cervical bleeding.     Comments: Irregular shaped, almost verticle os, possibly from cervical laceration during vaginal delivery. Lymphadenopathy:     Lower Body: No right inguinal adenopathy. No left inguinal adenopathy.  Neurological:     Mental Status: She is alert.       Assessment & Plan:  1. Abnormal uterine bleeding (AUB) Will check [place]. Start micornor. F/u in 2-3 months. - CBC

## 2019-03-27 NOTE — Progress Notes (Signed)
Pt states LMP was 02/11/19 and she has not stopped bleeding.

## 2019-03-28 LAB — CBC
Hematocrit: 34.1 % (ref 34.0–46.6)
Hemoglobin: 10.5 g/dL — ABNORMAL LOW (ref 11.1–15.9)
MCH: 24.4 pg — ABNORMAL LOW (ref 26.6–33.0)
MCHC: 30.8 g/dL — ABNORMAL LOW (ref 31.5–35.7)
MCV: 79 fL (ref 79–97)
Platelets: 380 10*3/uL (ref 150–450)
RBC: 4.3 x10E6/uL (ref 3.77–5.28)
RDW: 14.8 % (ref 11.7–15.4)
WBC: 6.9 10*3/uL (ref 3.4–10.8)

## 2019-04-06 ENCOUNTER — Emergency Department (HOSPITAL_BASED_OUTPATIENT_CLINIC_OR_DEPARTMENT_OTHER): Payer: No Typology Code available for payment source

## 2019-04-06 ENCOUNTER — Encounter (HOSPITAL_BASED_OUTPATIENT_CLINIC_OR_DEPARTMENT_OTHER): Payer: Self-pay

## 2019-04-06 ENCOUNTER — Other Ambulatory Visit: Payer: Self-pay

## 2019-04-06 ENCOUNTER — Emergency Department (HOSPITAL_BASED_OUTPATIENT_CLINIC_OR_DEPARTMENT_OTHER)
Admission: EM | Admit: 2019-04-06 | Discharge: 2019-04-07 | Disposition: A | Discharge status: Home / Self Care | Payer: No Typology Code available for payment source | Attending: Emergency Medicine | Admitting: Emergency Medicine

## 2019-04-06 DIAGNOSIS — J45909 Unspecified asthma, uncomplicated: Secondary | ICD-10-CM | POA: Insufficient documentation

## 2019-04-06 DIAGNOSIS — B349 Viral infection, unspecified: Secondary | ICD-10-CM

## 2019-04-06 DIAGNOSIS — M7918 Myalgia, other site: Secondary | ICD-10-CM | POA: Insufficient documentation

## 2019-04-06 DIAGNOSIS — R05 Cough: Secondary | ICD-10-CM | POA: Insufficient documentation

## 2019-04-06 DIAGNOSIS — R0602 Shortness of breath: Secondary | ICD-10-CM | POA: Insufficient documentation

## 2019-04-06 DIAGNOSIS — R059 Cough, unspecified: Secondary | ICD-10-CM

## 2019-04-06 MED ORDER — BENZONATATE 100 MG PO CAPS
100.0000 mg | ORAL_CAPSULE | Freq: Once | ORAL | Status: AC
  Administered 2019-04-07: 100 mg via ORAL
  Filled 2019-04-06: qty 1

## 2019-04-06 MED ORDER — IBUPROFEN 800 MG PO TABS
800.0000 mg | ORAL_TABLET | Freq: Once | ORAL | Status: AC
  Administered 2019-04-06: 800 mg via ORAL
  Filled 2019-04-06: qty 1

## 2019-04-06 NOTE — ED Triage Notes (Signed)
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

## 2019-04-06 NOTE — ED Provider Notes (Signed)
Emergency Department Provider Note   I have reviewed the triage vital signs and the nursing notes.   HISTORY  Chief Complaint Cough   HPI Brianna Byrd is a 24 y.o. female without significant past medical history who presents the emergency department today with viral symptoms.  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  She states that she started having a fever, chills, shortness of breath with exertion, cough and body aches.  These have been progressively worsening since Friday however today was when the fever started.  Patient states that her vital signs were taken by her nurse and she was sent here for further evaluation.  She is very been [employer] and had a Covid test which is still pending.  Patient with no urinary symptoms.  Patient states that she got ibuprofen prior to my evaluation and this seemed to help everything including her dizziness.  No chest pain or lower extremity swelling.  No nausea, vomiting, diarrhea or constipation.   No other associated or modifying symptoms.    Past Medical History:  Diagnosis Date  . Asthma   . Reflux     Patient Active Problem List   Diagnosis Date Noted  . Migraine without aura and without status migrainosus, not intractable 03/04/2019  . Left breast mass 01/27/2019  . History of pre-eclampsia 02/12/2017  . SVD (spontaneous vaginal delivery) 12/26/2016    Past Surgical History:  Procedure Laterality Date  . NO PAST SURGERIES      Current Outpatient Rx  . Order #: [id number] Class: Normal  . Order #: [id number] Class: Normal  . Order #: [id number] Class: Normal  . Order #: [id number] Class: Normal  . Order #: [id number] Class: Normal  . Order #: [id number] Class: Normal  . Order #: [id number] Class: Normal  . Order #: [id number] Class: Normal    Allergies Peach [prunus persica]  Family History    Problem Relation Age of Onset  . Diabetes Father   . Hypertension Mother   . Diabetes Sister   . Cancer Neg Hx     Social History Social History   Tobacco Use  . Smoking status: Never Smoker  . Smokeless tobacco: Never Used  Substance Use Topics  . Alcohol use: No  . Drug use: No    Review of Systems  All other systems negative except as documented in the HPI. All pertinent positives and negatives as reviewed in the HPI. ____________________________________________   PHYSICAL EXAM:  VITAL SIGNS: ED Triage Vitals  Enc Vitals Group     BP 04/06/19 2143 (!) 117/94     Pulse Rate 04/06/19 2143 (!) 127     Resp 04/06/19 2143 20     Temp 04/06/19 2143 (!) 101.7 F (38.7 C)     Temp Source 04/06/19 2143 Oral     SpO2 04/06/19 2143 100 %     Weight 04/06/19 2143 192 lb (87.1 kg)     Height 04/06/19 2143 5\' 1"  (1.549 m)    Constitutional: Alert and oriented. Well appearing and in no acute distress. Eyes: Conjunctivae are normal. PERRL. EOMI. Head: Atraumatic. Nose: No congestion/rhinnorhea. Mouth/Throat: Mucous membranes are moist.  Oropharynx non-erythematous. Neck: No stridor.  No meningeal signs.   Cardiovascular: Normal rate, regular rhythm. Good peripheral circulation. Grossly normal heart sounds.   Respiratory: Normal respiratory effort.  No retractions. Lungs CTAB. Gastrointestinal: Soft and nontender. No distention.  Musculoskeletal: No  lower extremity tenderness nor edema. No gross deformities of extremities. Neurologic:  Normal speech and language. No gross focal neurologic deficits are appreciated.  Skin:  Skin is warm, dry and intact. No rash noted.   ____________________________________________   LABS (all labs ordered are listed, but only abnormal results are displayed)  Labs Reviewed  RAPID INFLUENZA A&B ANTIGENS   ____________________________________________  EKG   EKG Interpretation  Date/Time:  Tuesday April 07 2019 00:06:21  EST Ventricular Rate:  108 PR Interval:    QRS Duration: 87 QT Interval:  329 QTC Calculation: 441 R Axis:   73 Text Interpretation: Sinus tachycardia aside from tachycardia, no other changes Confirmed by Marily Memos [phone]) on 04/07/2019 12:14:36 AM       ____________________________________________  RADIOLOGY  DG Chest Portable 1 View  Result Date: 04/07/2019 CLINICAL DATA:  Cough. Flu-like symptoms today. EXAM: PORTABLE CHEST 1 VIEW COMPARISON:  Radiograph 08/03/2017 FINDINGS: The cardiomediastinal contours are normal. The lungs are clear. Pulmonary vasculature is normal. No consolidation, pleural effusion, or pneumothorax. No acute osseous abnormalities are seen. IMPRESSION: Negative radiograph of the chest. Electronically Signed   By: Narda Rutherford M.D.   On: 04/07/2019 00:05    ____________________________________________   PROCEDURES  Procedure(s) performed:   Procedures   ____________________________________________   INITIAL IMPRESSION / ASSESSMENT AND PLAN / ED COURSE  covid pending at [facility]. No e/o bacterial infection. No e/o influenza. ecg reassurring. Patient with just cough at this point. All symptoms improved with defeverscence. Will continue same at home.      Pertinent labs & imaging results that were available during my care of the patient were reviewed by me and considered in my medical decision making (see chart for details).  A medical screening exam was performed and I feel the patient has had an appropriate workup for their chief complaint at this time and likelihood of emergent condition existing is low. They have been counseled on decision, discharge, follow up and which symptoms necessitate immediate return to the emergency department. They or their family verbally stated understanding and agreement with plan and discharged in stable condition.   ____________________________________________  FINAL CLINICAL IMPRESSION(S) / ED DIAGNOSES  Final  diagnoses:  Cough  Viral syndrome     MEDICATIONS GIVEN DURING THIS VISIT:  Medications  ibuprofen (ADVIL) tablet 800 mg (800 mg Oral Given 04/06/19 2148)  benzonatate (TESSALON) capsule 100 mg (100 mg Oral Given 04/07/19 0010)     NEW OUTPATIENT MEDICATIONS STARTED DURING THIS VISIT:  Discharge Medication List as of 04/07/2019 12:43 AM    START taking these medications   Details  benzonatate (TESSALON) 100 MG capsule Take 1 capsule (100 mg total) by mouth every 8 (eight) hours., Starting Tue 04/07/2019, Normal    ibuprofen (ADVIL) 800 MG tablet Take 1 tablet (800 mg total) by mouth 3 (three) times daily., Starting Tue 04/07/2019, Normal        Note:  This note was prepared with assistance of Dragon voice recognition software. Occasional wrong-word or sound-a-like substitutions may have occurred due to the inherent limitations of voice recognition software.   Ilea Hilton, Barbara Cower, MD 04/07/19 [phone]

## 2019-04-07 ENCOUNTER — Encounter: Payer: Self-pay | Admitting: Family Medicine

## 2019-04-07 LAB — RAPID INFLUENZA A&B ANTIGENS
Influenza A (ARMC): NEGATIVE
Influenza B (ARMC): NEGATIVE

## 2019-04-07 MED ORDER — IBUPROFEN 800 MG PO TABS: 800.0000 mg | ORAL_TABLET | Freq: Three times a day (TID) | ORAL | 0 refills | Status: DC

## 2019-04-07 MED ORDER — BENZONATATE 100 MG PO CAPS: 100.0000 mg | ORAL_CAPSULE | Freq: Three times a day (TID) | ORAL | 0 refills | Status: DC

## 2019-04-10 ENCOUNTER — Ambulatory Visit (HOSPITAL_BASED_OUTPATIENT_CLINIC_OR_DEPARTMENT_OTHER): Admission: RE | Admit: 2019-04-10 | Payer: Self-pay | Source: Ambulatory Visit

## 2019-05-01 ENCOUNTER — Encounter (HOSPITAL_BASED_OUTPATIENT_CLINIC_OR_DEPARTMENT_OTHER): Payer: Self-pay | Admitting: Emergency Medicine

## 2019-05-01 ENCOUNTER — Emergency Department (HOSPITAL_BASED_OUTPATIENT_CLINIC_OR_DEPARTMENT_OTHER)
Admission: EM | Admit: 2019-05-01 | Discharge: 2019-05-01 | Disposition: A | Discharge status: Home / Self Care | Payer: Medicaid Other | Attending: Emergency Medicine | Admitting: Emergency Medicine

## 2019-05-01 ENCOUNTER — Other Ambulatory Visit: Payer: Self-pay

## 2019-05-01 DIAGNOSIS — J45909 Unspecified asthma, uncomplicated: Secondary | ICD-10-CM | POA: Insufficient documentation

## 2019-05-01 DIAGNOSIS — L02211 Cutaneous abscess of abdominal wall: Secondary | ICD-10-CM | POA: Insufficient documentation

## 2019-05-01 DIAGNOSIS — Z91018 Allergy to other foods: Secondary | ICD-10-CM | POA: Insufficient documentation

## 2019-05-01 DIAGNOSIS — Z793 Long term (current) use of hormonal contraceptives: Secondary | ICD-10-CM | POA: Insufficient documentation

## 2019-05-01 MED ORDER — LIDOCAINE HCL (PF) 1 % IJ SOLN
5.0000 mL | Freq: Once | INTRAMUSCULAR | Status: AC
  Administered 2019-05-01: 05:00:00 5 mL
  Filled 2019-05-01: qty 5

## 2019-05-01 NOTE — ED Triage Notes (Signed)
Patient presents with complaints of abscess to lower abd onset 2-3 days; denies fever; denies drainage; states doing warm compresses with no relief.

## 2019-05-01 NOTE — Discharge Instructions (Signed)
Take ibuprofen as needed.  Return if you are having any problems.

## 2019-05-01 NOTE — ED Provider Notes (Signed)
[facility] EMERGENCY DEPARTMENT Provider Note   CSN: [id number] Arrival date & time: 05/01/19  0403   History Chief Complaint  Patient presents with  . Abscess    Brianna Byrd is a 24 y.o. female.  The history is provided by the patient.  Abscess He has history of asthma and comes in complaining of a painful, swollen area in her lower abdomen.  It has been there for 2 days and is getting worse.  She has tried using warm compresses without relief.   Past Medical History:  Diagnosis Date  . Asthma   . Reflux     Patient Active Problem List   Diagnosis Date Noted  . Migraine without aura and without status migrainosus, not intractable 03/04/2019  . Left breast mass 01/27/2019  . History of pre-eclampsia 02/12/2017  . SVD (spontaneous vaginal delivery) 12/26/2016    Past Surgical History:  Procedure Laterality Date  . NO PAST SURGERIES       OB History    Gravida  1   Para  1   Term  1   Preterm      AB      Living  1     SAB      TAB      Ectopic      Multiple  0   Live Births  1           Family History  Problem Relation Age of Onset  . Diabetes Father   . Hypertension Mother   . Diabetes Sister   . Cancer Neg Hx     Social History   Tobacco Use  . Smoking status: Never Smoker  . Smokeless tobacco: Never Used  Substance Use Topics  . Alcohol use: No  . Drug use: No    Home Medications Prior to Admission medications   Medication Sig Start Date End Date Taking? Authorizing Provider  benzonatate (TESSALON) 100 MG capsule Take 1 capsule (100 mg total) by mouth every 8 (eight) hours. 04/07/19   Mesner, Barbara Cower, MD  cyclobenzaprine (FLEXERIL) 10 MG tablet Take 0.5-1 tablets (5-10 mg total) by mouth 3 (three) times daily as needed for muscle spasms. Patient not taking: Reported on 03/27/2019 03/04/19   Sharlene Dory, DO  ferrous gluconate (FERGON) 324 MG tablet Take 1 tablet (324 mg total) by mouth 2 (two) times  daily with a meal. 03/27/19   Levie Heritage, DO  fluticasone (FLONASE) 50 MCG/ACT nasal spray Place 2 sprays into both nostrils daily. Patient not taking: Reported on 01/27/2019 06/05/18   Palumbo, April, MD  ibuprofen (ADVIL) 800 MG tablet Take 1 tablet (800 mg total) by mouth 3 (three) times daily. 04/07/19   Mesner, Barbara Cower, MD  medroxyPROGESTERone (PROVERA) 10 MG tablet Take 1 tablet (10 mg total) by mouth daily for 7 days. 03/04/19 03/11/19  Sharlene Dory, DO  norethindrone (ORTHO MICRONOR) 0.35 MG tablet Take 1 tablet (0.35 mg total) by mouth daily. 03/27/19   Levie Heritage, DO  SUMAtriptan (IMITREX) 100 MG tablet Take 1 tablet (100 mg total) by mouth every 2 (two) hours as needed for migraine. May repeat in 2 hours if headache persists or recurs. Patient not taking: Reported on 03/27/2019 03/04/19   Sharlene Dory, DO    Allergies    Peach [prunus persica]  Review of Systems   Review of Systems  All other systems reviewed and are negative.   Physical Exam Updated Vital Signs BP 123/81 (BP Location:  Right Arm)   Pulse 84   Temp 98.5 F (36.9 C) (Oral)   Resp 16   Ht 5\' 2"  (1.575 m)   Wt 87 kg   LMP 04/24/2019   SpO2 100%   BMI 35.08 kg/m   Physical Exam Vitals and nursing note reviewed.   24 year old female, resting comfortably and in no acute distress. Vital signs are normal. Oxygen saturation is 100%, which is normal. Head is normocephalic and atraumatic. PERRLA, EOMI. Oropharynx is clear. Neck is nontender and supple without adenopathy or JVD. Back is nontender and there is no CVA tenderness. Lungs are clear without rales, wheezes, or rhonchi. Chest is nontender. Heart has regular rate and rhythm without murmur. Abdomen is soft, flat, masses or hepatosplenomegaly and peristalsis is normoactive.  Abscess is present in the lower abdomen in the midline, approximately 2.5 cm in diameter. Extremities have no cyanosis or edema, full range of motion is  present. Skin is warm and dry without rash. Neurologic: Mental status is normal, cranial nerves are intact, there are no motor or sensory deficits.  ED Results / Procedures / Treatments    Procedures .Marland KitchenIncision and Drainage  Date/Time: 05/01/2019 4:40 AM Performed by: Delora Fuel, MD Authorized by: Delora Fuel, MD   Consent:    Consent obtained:  Verbal   Consent given by:  Patient   Risks discussed:  Bleeding, incomplete drainage and pain   Alternatives discussed:  No treatment Location:    Type:  Abscess   Size:  2.5 cm   Location:  Trunk   Trunk location:  Abdomen Pre-procedure details:    Skin preparation:  Chloraprep Anesthesia (see MAR for exact dosages):    Anesthesia method:  Local infiltration   Local anesthetic:  Lidocaine 1% w/o epi Procedure type:    Complexity:  Complex Procedure details:    Needle aspiration: no     Incision types:  Cruciate   Incision depth:  Subcutaneous   Scalpel blade:  11   Wound management:  Probed and deloculated   Drainage:  Purulent   Drainage amount:  Scant   Wound treatment:  Wound left open   Packing materials:  None Post-procedure details:    Patient tolerance of procedure:  Tolerated well, no immediate complications     Medications Ordered in ED Medications  lidocaine (PF) (XYLOCAINE) 1 % injection 5 mL (has no administration in time range)    ED Course  I have reviewed the triage vital signs and the nursing notes.  Pertinent labs & imaging results that were available during my care of the patient were reviewed by me and considered in my medical decision making (see chart for details).  MDM Rules/Calculators/A&P Abscess of the lower abdomen treated with incision and drainage.  Old records are reviewed, and she has no relevant past visits.  Final Clinical Impression(s) / ED Diagnoses Final diagnoses:  Cutaneous abscess of abdominal wall    Rx / DC Orders ED Discharge Orders    None       Delora Fuel,  MD 32/20/25 [phone]

## 2019-05-22 ENCOUNTER — Telehealth: Payer: Self-pay | Admitting: Physician Assistant

## 2019-05-22 ENCOUNTER — Encounter: Payer: Self-pay | Admitting: Family Medicine

## 2019-05-22 ENCOUNTER — Ambulatory Visit (INDEPENDENT_AMBULATORY_CARE_PROVIDER_SITE_OTHER): Payer: Self-pay | Admitting: Family Medicine

## 2019-05-22 ENCOUNTER — Inpatient Hospital Stay: Admission: RE | Admit: 2019-05-22 | Payer: Self-pay | Source: Ambulatory Visit

## 2019-05-22 ENCOUNTER — Emergency Department (HOSPITAL_BASED_OUTPATIENT_CLINIC_OR_DEPARTMENT_OTHER)
Admission: EM | Admit: 2019-05-22 | Discharge: 2019-05-22 | Disposition: A | Discharge status: Home / Self Care | Payer: Medicaid Other | Attending: Emergency Medicine | Admitting: Emergency Medicine

## 2019-05-22 ENCOUNTER — Other Ambulatory Visit: Payer: Self-pay

## 2019-05-22 ENCOUNTER — Encounter (HOSPITAL_BASED_OUTPATIENT_CLINIC_OR_DEPARTMENT_OTHER): Payer: Self-pay | Admitting: *Deleted

## 2019-05-22 VITALS — BP 106/72 | HR 98 | Ht 61.0 in | Wt 213.0 lb

## 2019-05-22 DIAGNOSIS — G43009 Migraine without aura, not intractable, without status migrainosus: Secondary | ICD-10-CM

## 2019-05-22 DIAGNOSIS — R1011 Right upper quadrant pain: Secondary | ICD-10-CM

## 2019-05-22 DIAGNOSIS — R1013 Epigastric pain: Secondary | ICD-10-CM | POA: Insufficient documentation

## 2019-05-22 DIAGNOSIS — Z79899 Other long term (current) drug therapy: Secondary | ICD-10-CM | POA: Insufficient documentation

## 2019-05-22 DIAGNOSIS — Z91018 Allergy to other foods: Secondary | ICD-10-CM | POA: Insufficient documentation

## 2019-05-22 DIAGNOSIS — N939 Abnormal uterine and vaginal bleeding, unspecified: Secondary | ICD-10-CM

## 2019-05-22 LAB — CBC
HCT: 34.3 % — ABNORMAL LOW (ref 36.0–46.0)
Hemoglobin: 10.3 g/dL — ABNORMAL LOW (ref 12.0–15.0)
MCH: 23.9 pg — ABNORMAL LOW (ref 26.0–34.0)
MCHC: 30 g/dL (ref 30.0–36.0)
MCV: 79.6 fL — ABNORMAL LOW (ref 80.0–100.0)
Platelets: 453 10*3/uL — ABNORMAL HIGH (ref 150–400)
RBC: 4.31 MIL/uL (ref 3.87–5.11)
RDW: 15.2 % (ref 11.5–15.5)
WBC: 9.4 10*3/uL (ref 4.0–10.5)
nRBC: 0 % (ref 0.0–0.2)

## 2019-05-22 LAB — COMPREHENSIVE METABOLIC PANEL
ALT: 14 U/L (ref 0–44)
AST: 16 U/L (ref 15–41)
Albumin: 4 g/dL (ref 3.5–5.0)
Alkaline Phosphatase: 75 U/L (ref 38–126)
Anion gap: 8 (ref 5–15)
BUN: 9 mg/dL (ref 6–20)
CO2: 24 mmol/L (ref 22–32)
Calcium: 8.8 mg/dL — ABNORMAL LOW (ref 8.9–10.3)
Chloride: 106 mmol/L (ref 98–111)
Creatinine, Ser: 0.69 mg/dL (ref 0.44–1.00)
GFR calc Af Amer: >60 mL/min (ref >60)
GFR calc non Af Amer: >60 mL/min (ref >60)
Glucose, Bld: 88 mg/dL (ref 70–99)
Potassium: 3.7 mmol/L (ref 3.5–5.1)
Sodium: 138 mmol/L (ref 135–145)
Total Bilirubin: 0.4 mg/dL (ref 0.3–1.2)
Total Protein: 8.3 g/dL — ABNORMAL HIGH (ref 6.5–8.1)

## 2019-05-22 LAB — PREGNANCY, URINE: Preg Test, Ur: NEGATIVE

## 2019-05-22 LAB — URINALYSIS, MICROSCOPIC (REFLEX)

## 2019-05-22 LAB — URINALYSIS, ROUTINE W REFLEX MICROSCOPIC
Bilirubin Urine: NEGATIVE
Glucose, UA: NEGATIVE mg/dL
Ketones, ur: NEGATIVE mg/dL
Nitrite: NEGATIVE
Protein, ur: NEGATIVE mg/dL
Specific Gravity, Urine: 1.02 (ref 1.005–1.030)
pH: 6.5 (ref 5.0–8.0)

## 2019-05-22 MED ORDER — ALUM & MAG HYDROXIDE-SIMETH 200-200-20 MG/5ML PO SUSP
30.0000 mL | Freq: Once | ORAL | Status: AC
  Administered 2019-05-22: 30 mL via ORAL
  Filled 2019-05-22: qty 30

## 2019-05-22 MED ORDER — SODIUM CHLORIDE 0.9% FLUSH
3.0000 mL | Freq: Once | INTRAVENOUS | Status: DC
  Filled 2019-05-22: qty 3

## 2019-05-22 MED ORDER — NORGESTIMATE-ETH ESTRADIOL 0.25-35 MG-MCG PO TABS: 1.0000 | ORAL_TABLET | Freq: Every day | ORAL | 4 refills | Status: DC

## 2019-05-22 MED ORDER — PANTOPRAZOLE SODIUM 20 MG PO TBEC: 20.0000 mg | DELAYED_RELEASE_TABLET | Freq: Every day | ORAL | 0 refills | Status: DC

## 2019-05-22 MED ORDER — HYDROCORTISONE 2.5 % EX CREA: TOPICAL_CREAM | Freq: Two times a day (BID) | CUTANEOUS | 0 refills | Status: DC

## 2019-05-22 MED ORDER — LIDOCAINE VISCOUS HCL 2 % MT SOLN
15.0000 mL | Freq: Once | OROMUCOSAL | Status: AC
  Administered 2019-05-22: 15 mL via ORAL
  Filled 2019-05-22: qty 15

## 2019-05-22 NOTE — Progress Notes (Signed)
   Subjective:    Patient ID: Brianna Byrd, female    DOB: 08-06-1995, 24 y.o.   MRN: [id number]  HPI Still having bleeding despite being on micronor. Stopped taking it because it wasn't helpful.  Her migraines are associated with the start of her periods. Did notice that she didn't have a migraine while being on the birth control.   Review of Systems     Objective:   Physical Exam Constitutional:      Appearance: Normal appearance.  Cardiovascular:     Rate and Rhythm: Normal rate.     Pulses: Normal pulses.  Pulmonary:     Effort: Pulmonary effort is normal.  Skin:    Capillary Refill: Capillary refill takes less than 2 seconds.  Neurological:     Mental Status: She is alert.  Psychiatric:        Mood and Affect: Mood normal.        Behavior: Behavior normal.        Thought Content: Thought content normal.        Judgment: Judgment normal.       Assessment & Plan:  1. Abnormal uterine bleeding (AUB) Start OCP taper. Instructions given. Patient to call if continues to have bleeding or if bleeding returns with lower dose of hormones.   2. Migraine without aura and without status migrainosus, not intractable Menstrual migraines. Safe to use COCs.

## 2019-05-22 NOTE — ED Triage Notes (Signed)
Abdominal pain into her back x 30 minutes. Pain feels like gas.

## 2019-05-22 NOTE — Patient Instructions (Signed)
Start Sprintec today or tomorrow: Take 1 tablet by mouth daily. 3 tabs daily for 3 days, then 2 tabs daily for 3 days, then one tab daily. Skip the last week of pills and start a new pack and continue taking one pill a day.  Send me a MyChart message in 2 weeks with an update.

## 2019-05-22 NOTE — ED Provider Notes (Signed)
[facility] EMERGENCY DEPARTMENT Provider Note   CSN: [id number] Arrival date & time: 05/22/19  2141     History Chief Complaint  Patient presents with  . Abdominal Pain  . Back Pain    Brianna Byrd is a 24 y.o. female.  Patient is a 24 year old female with past medical history of migraines, reflux, asthma.  She presents today for evaluation of abdominal pain.  Patient reports eating a slice of pizza, then shortly after developed pain in her epigastric region radiating into her left flank.  She felt as though she had to burp, but could not.  This lasted for approximately 30 minutes, then she presents to the ER.  She denies fevers or chills.  She denies recent vomiting or diarrhea.  Symptoms seem to be improving somewhat since arriving here.  The history is provided by the patient.  Abdominal Pain Pain location:  Epigastric Pain quality: cramping   Pain radiates to:  L flank Pain severity:  Moderate Onset quality:  Sudden Timing:  Constant Progression:  Partially resolved Chronicity:  New Relieved by:  Nothing Worsened by:  Nothing Back Pain Associated symptoms: abdominal pain        Past Medical History:  Diagnosis Date  . Asthma   . Reflux     Patient Active Problem List   Diagnosis Date Noted  . Migraine without aura and without status migrainosus, not intractable 03/04/2019  . Left breast mass 01/27/2019  . History of pre-eclampsia 02/12/2017  . SVD (spontaneous vaginal delivery) 12/26/2016    Past Surgical History:  Procedure Laterality Date  . NO PAST SURGERIES       OB History    Gravida  1   Para  1   Term  1   Preterm      AB      Living  1     SAB      TAB      Ectopic      Multiple  0   Live Births  1           Family History  Problem Relation Age of Onset  . Diabetes Father   . Hypertension Mother   . Diabetes Sister   . Cancer Neg Hx     Social History   Tobacco Use  . Smoking status: Never  Smoker  . Smokeless tobacco: Never Used  Substance Use Topics  . Alcohol use: No  . Drug use: No    Home Medications Prior to Admission medications   Medication Sig Start Date End Date Taking? Authorizing Provider  ferrous gluconate (FERGON) 324 MG tablet Take 1 tablet (324 mg total) by mouth 2 (two) times daily with a meal. 03/27/19   Levie Heritage, DO  ibuprofen (ADVIL) 800 MG tablet Take 1 tablet (800 mg total) by mouth 3 (three) times daily. 04/07/19   Mesner, Barbara Cower, MD  norgestimate-ethinyl estradiol (ORTHO-CYCLEN) 0.25-35 MG-MCG tablet Take 1 tablet by mouth daily. 3 tabs daily for 3 days, then 2 tabs daily for 3 days, then one tab daily. Skip the last week of pills and start a new pack. 05/22/19   Levie Heritage, DO  fluticasone (FLONASE) 50 MCG/ACT nasal spray Place 2 sprays into both nostrils daily. Patient not taking: Reported on 01/27/2019 06/05/18 05/01/19  Palumbo, April, MD  medroxyPROGESTERone (PROVERA) 10 MG tablet Take 1 tablet (10 mg total) by mouth daily for 7 days. 03/04/19 05/01/19  Sharlene Dory, DO  norethindrone [facility])  0.35 MG tablet Take 1 tablet (0.35 mg total) by mouth daily. 03/27/19 05/22/19  Levie Heritage, DO  SUMAtriptan (IMITREX) 100 MG tablet Take 1 tablet (100 mg total) by mouth every 2 (two) hours as needed for migraine. May repeat in 2 hours if headache persists or recurs. Patient not taking: Reported on 03/27/2019 03/04/19 05/01/19  Sharlene Dory, DO    Allergies    Peach [prunus persica]  Review of Systems   Review of Systems  Gastrointestinal: Positive for abdominal pain.  Musculoskeletal: Positive for back pain.  All other systems reviewed and are negative.   Physical Exam Updated Vital Signs BP 127/83   Pulse (!) 114   Temp 98.3 F (36.8 C) (Oral)   Resp 20   Ht 5\' 1"  (1.549 m)   Wt 96.6 kg   LMP 04/24/2019   SpO2 99%   BMI 40.25 kg/m   Physical Exam Vitals and nursing note reviewed.  Constitutional:       General: She is not in acute distress.    Appearance: She is well-developed. She is not diaphoretic.  HENT:     Head: Normocephalic and atraumatic.  Cardiovascular:     Rate and Rhythm: Normal rate and regular rhythm.     Heart sounds: No murmur. No friction rub. No gallop.   Pulmonary:     Effort: Pulmonary effort is normal. No respiratory distress.     Breath sounds: Normal breath sounds. No wheezing.  Abdominal:     General: Bowel sounds are normal. There is no distension.     Palpations: Abdomen is soft.     Tenderness: There is abdominal tenderness in the epigastric area. There is no right CVA tenderness, left CVA tenderness, guarding or rebound.  Musculoskeletal:        General: Normal range of motion.     Cervical back: Normal range of motion and neck supple.  Skin:    General: Skin is warm and dry.  Neurological:     Mental Status: She is alert and oriented to person, place, and time.     ED Results / Procedures / Treatments   Labs (all labs ordered are listed, but only abnormal results are displayed) Labs Reviewed  COMPREHENSIVE METABOLIC PANEL  CBC  URINALYSIS, ROUTINE W REFLEX MICROSCOPIC  PREGNANCY, URINE    EKG None  Radiology No results found.  Procedures Procedures (including critical care time)  Medications Ordered in ED Medications  sodium chloride flush (NS) 0.9 % injection 3 mL (3 mLs Intravenous Not Given 05/22/19 2200)    ED Course  I have reviewed the triage vital signs and the nursing notes.  Pertinent labs & imaging results that were available during my care of the patient were reviewed by me and considered in my medical decision making (see chart for details).    MDM Rules/Calculators/A&P  Patient presents here with complaints of abdominal pain that started shortly after eating a slice of pizza.  The pain is epigastric in nature and radiates to the left flank.  She felt as though she had to belch, but could not.  Patient's symptoms seem  to ease up after a GI cocktail.  Her laboratory studies are unremarkable and abdomen is benign.  At this point, I feel as though patient can be discharged safely.  I will order a right upper quadrant ultrasound for tomorrow as they have already left for the evening.  Final Clinical Impression(s) / ED Diagnoses Final diagnoses:  None  Rx / DC Orders ED Discharge Orders    None       Veryl Speak, MD 05/22/19 2250

## 2019-05-22 NOTE — Telephone Encounter (Signed)
Called patient to discuss video visit for AUB. Patient saw GYN today for the same. Is on OCPs. Patient was looking for more workup due to AUB with bleeding since 01/2019. Discussed further workup would be with GYN, and would need further follow up with them for this. Patient expresses understanding.

## 2019-05-22 NOTE — Discharge Instructions (Addendum)
Begin taking Protonix as prescribed.  Apply hydrocortisone cream to the area on your neck twice daily.  Return to the radiology department tomorrow for an ultrasound of your gallbladder.  Return to the ER if your symptoms significantly worsen or change.

## 2019-05-23 ENCOUNTER — Ambulatory Visit (HOSPITAL_BASED_OUTPATIENT_CLINIC_OR_DEPARTMENT_OTHER)
Admission: RE | Admit: 2019-05-23 | Discharge: 2019-05-23 | Disposition: A | Discharge status: Home / Self Care | Payer: Self-pay | Source: Ambulatory Visit | Attending: Emergency Medicine | Admitting: Emergency Medicine

## 2019-05-23 DIAGNOSIS — R1011 Right upper quadrant pain: Secondary | ICD-10-CM | POA: Insufficient documentation

## 2019-05-23 NOTE — ED Provider Notes (Signed)
Pt returned for her gallbladder US.  This [place] is nl.  She is feeling well.  She is told to return if worse.    Jacalyn Lefevre, MD 05/23/19 1109

## 2019-05-24 ENCOUNTER — Encounter (HOSPITAL_COMMUNITY): Payer: Self-pay | Admitting: Pharmacy Technician

## 2019-05-24 ENCOUNTER — Emergency Department (HOSPITAL_COMMUNITY): Payer: Medicaid Other

## 2019-05-24 ENCOUNTER — Emergency Department (HOSPITAL_COMMUNITY)
Admission: EM | Admit: 2019-05-24 | Discharge: 2019-05-24 | Disposition: A | Discharge status: Home / Self Care | Payer: Medicaid Other | Attending: Emergency Medicine | Admitting: Emergency Medicine

## 2019-05-24 DIAGNOSIS — R5383 Other fatigue: Secondary | ICD-10-CM | POA: Insufficient documentation

## 2019-05-24 DIAGNOSIS — N938 Other specified abnormal uterine and vaginal bleeding: Secondary | ICD-10-CM | POA: Insufficient documentation

## 2019-05-24 DIAGNOSIS — R42 Dizziness and giddiness: Secondary | ICD-10-CM | POA: Insufficient documentation

## 2019-05-24 DIAGNOSIS — N939 Abnormal uterine and vaginal bleeding, unspecified: Secondary | ICD-10-CM

## 2019-05-24 LAB — I-STAT CHEM 8, ED
BUN: 7 mg/dL (ref 6–20)
Calcium, Ion: 1.13 mmol/L — ABNORMAL LOW (ref 1.15–1.40)
Chloride: 105 mmol/L (ref 98–111)
Creatinine, Ser: 0.6 mg/dL (ref 0.44–1.00)
Glucose, Bld: 87 mg/dL (ref 70–99)
HCT: 34 % — ABNORMAL LOW (ref 36.0–46.0)
Hemoglobin: 11.6 g/dL — ABNORMAL LOW (ref 12.0–15.0)
Potassium: 4 mmol/L (ref 3.5–5.1)
Sodium: 139 mmol/L (ref 135–145)
TCO2: 25 mmol/L (ref 22–32)

## 2019-05-24 LAB — WET PREP, GENITAL
Clue Cells Wet Prep HPF POC: NONE SEEN
Sperm: NONE SEEN
Trich, Wet Prep: NONE SEEN
Yeast Wet Prep HPF POC: NONE SEEN

## 2019-05-24 LAB — I-STAT BETA HCG BLOOD, ED (MC, WL, AP ONLY): I-stat hCG, quantitative: <5 m[IU]/mL (ref <5)

## 2019-05-24 NOTE — ED Notes (Signed)
Pt transported to US

## 2019-05-24 NOTE — ED Triage Notes (Signed)
Reports heavy vaginal bleeding onset today. States soaking a pad every hour. Recently switched birth control.

## 2019-05-24 NOTE — ED Provider Notes (Signed)
[facility] EMERGENCY DEPARTMENT Provider Note   CSN: [id number] Arrival date & time: 05/24/19  1519     History Chief Complaint  Patient presents with  . Vaginal Bleeding    Brianna Byrd is a 24 y.o. female.  Ascencion Hornsby is a 24 y.o. female with history of asthma, acid reflux, migraines, who presents to the emergency department for evaluation of vaginal bleeding.  She states that she has been dealing with constant vaginal bleeding since October, and is followed by Dr. Gust Rung with OB/GYN for this.  She reports that they have been trying her on different birth control medications, and she was just prescribed Ortho-Cyclen to start but has been not begun this yet and is still taking her previous birth control medication.  She states this morning she woke up in a puddle of blood and has been having heavy vaginal bleeding throughout the day.  She states that she has been having to change her pad about once every hour.  She states this is the heaviest her vaginal bleeding has ever been prior to that it was more like bleeding on the first or second day of a.  More constantly.  She states she has been passing clots most of which are relatively small but some have been up to the size of a nickel or quarter.  She reports some lower abdominal cramping associated with this.  No fevers or chills.  Denies nausea or vomiting.  No other aggravating or alleviating factors.        Past Medical History:  Diagnosis Date  . Asthma   . Reflux     Patient Active Problem List   Diagnosis Date Noted  . Migraine without aura and without status migrainosus, not intractable 03/04/2019  . Left breast mass 01/27/2019  . History of pre-eclampsia 02/12/2017  . SVD (spontaneous vaginal delivery) 12/26/2016    Past Surgical History:  Procedure Laterality Date  . NO PAST SURGERIES       OB History    Gravida  1   Para  1   Term  1   Preterm      AB      Living  1     SAB      TAB      Ectopic      Multiple  0   Live Births  1           Family History  Problem Relation Age of Onset  . Diabetes Father   . Hypertension Mother   . Diabetes Sister   . Cancer Neg Hx     Social History   Tobacco Use  . Smoking status: Never Smoker  . Smokeless tobacco: Never Used  Substance Use Topics  . Alcohol use: No  . Drug use: No    Home Medications Prior to Admission medications   Medication Sig Start Date End Date Taking? Authorizing Provider  ferrous gluconate (FERGON) 324 MG tablet Take 1 tablet (324 mg total) by mouth 2 (two) times daily with a meal. 03/27/19   Levie Heritage, DO  hydrocortisone 2.5 % cream Apply topically 2 (two) times daily. 05/22/19   Geoffery Lyons, MD  ibuprofen (ADVIL) 800 MG tablet Take 1 tablet (800 mg total) by mouth 3 (three) times daily. 04/07/19   Mesner, Barbara Cower, MD  norgestimate-ethinyl estradiol (ORTHO-CYCLEN) 0.25-35 MG-MCG tablet Take 1 tablet by mouth daily. 3 tabs daily for 3 days, then 2 tabs daily for 3 days, then one  tab daily. Skip the last week of pills and start a new pack. 05/22/19   Levie Heritage, DO  ondansetron (ZOFRAN ODT) 4 MG disintegrating tablet Take 1 tablet (4 mg total) by mouth every 6 (six) hours as needed for nausea. 05/26/19   Levie Heritage, DO  pantoprazole (PROTONIX) 20 MG tablet Take 1 tablet (20 mg total) by mouth daily. 05/22/19   Geoffery Lyons, MD  fluticasone (FLONASE) 50 MCG/ACT nasal spray Place 2 sprays into both nostrils daily. Patient not taking: Reported on 01/27/2019 06/05/18 05/01/19  Palumbo, April, MD  medroxyPROGESTERone (PROVERA) 10 MG tablet Take 1 tablet (10 mg total) by mouth daily for 7 days. 03/04/19 05/01/19  Sharlene Dory, DO  norethindrone (ORTHO MICRONOR) 0.35 MG tablet Take 1 tablet (0.35 mg total) by mouth daily. 03/27/19 05/22/19  Levie Heritage, DO  SUMAtriptan (IMITREX) 100 MG tablet Take 1 tablet (100 mg total) by mouth every 2 (two) hours as needed for  migraine. May repeat in 2 hours if headache persists or recurs. Patient not taking: Reported on 03/27/2019 03/04/19 05/01/19  Sharlene Dory, DO    Allergies    Peach [prunus persica]  Review of Systems   Review of Systems  Constitutional: Positive for fatigue. Negative for chills and fever.  Respiratory: Negative for cough and shortness of breath.   Cardiovascular: Negative for chest pain.  Gastrointestinal: Negative for abdominal pain, diarrhea, nausea and vomiting.  Genitourinary: Positive for vaginal bleeding. Negative for dysuria, frequency and vaginal discharge.  Musculoskeletal: Negative for arthralgias and myalgias.  Skin: Negative for pallor.  Neurological: Positive for light-headedness. Negative for syncope.  All other systems reviewed and are negative.   Physical Exam Updated Vital Signs BP (!) 130/91   Pulse 93   Temp 98 F (36.7 C)   Resp 16   LMP 04/24/2019   SpO2 99%   Physical Exam Vitals and nursing note reviewed.  Constitutional:      General: She is not in acute distress.    Appearance: Normal appearance. She is well-developed. She is obese. She is not ill-appearing or diaphoretic.  HENT:     Head: Normocephalic and atraumatic.     Mouth/Throat:     Mouth: Mucous membranes are moist.     Pharynx: Oropharynx is clear.  Eyes:     General:        Right eye: No discharge.        Left eye: No discharge.  Cardiovascular:     Rate and Rhythm: Normal rate and regular rhythm.     Heart sounds: Normal heart sounds. No murmur. No friction rub. No gallop.   Pulmonary:     Effort: Pulmonary effort is normal. No respiratory distress.     Breath sounds: Normal breath sounds. No wheezing or rales.     Comments: Respirations equal and unlabored, patient able to speak in full sentences, lungs clear to auscultation bilaterally Abdominal:     General: Bowel sounds are normal. There is no distension.     Palpations: Abdomen is soft. There is no mass.      Tenderness: There is no abdominal tenderness. There is no guarding.     Comments: Abdomen soft, nondistended, nontender to palpation in all quadrants without guarding or peritoneal signs  Genitourinary:    Comments: Chaperone present during pelvic exam. No external genital lesions noted. Speculum exam reveals moderate amount of blood pooled in the vaginal vault, blood cleared with Fox swabs, there is no active bleeding,  a few small blood clots noted.  No discharge noted On bimanual exam there is no cervical motion tenderness, no focal uterine or adnexal tenderness. Musculoskeletal:        General: No deformity.     Cervical back: Neck supple.  Skin:    General: Skin is warm and dry.     Capillary Refill: Capillary refill takes less than 2 seconds.  Neurological:     Mental Status: She is alert.     Coordination: Coordination normal.     Comments: Speech is clear, able to follow commands Moves extremities without ataxia, coordination intact  Psychiatric:        Mood and Affect: Mood normal.        Behavior: Behavior normal.     ED Results / Procedures / Treatments   Labs (all labs ordered are listed, but only abnormal results are displayed) Labs Reviewed  WET PREP, GENITAL - Abnormal; Notable for the following components:      Result Value   WBC, Wet Prep HPF POC FEW (*)    All other components within normal limits  I-STAT CHEM 8, ED - Abnormal; Notable for the following components:   Calcium, Ion 1.13 (*)    Hemoglobin 11.6 (*)    HCT 34.0 (*)    All other components within normal limits  VON WILLEBRAND PANEL  I-STAT BETA HCG BLOOD, ED ([facility], [facility], [facility] ONLY)  GC/CHLAMYDIA PROBE AMP ([facility]) NOT AT [facility]    EKG None  Radiology US PELVIC COMPLETE WITH TRANSVAGINAL  Result Date: 05/24/2019 CLINICAL DATA:  Heavy vaginal bleeding for several months. EXAM: TRANSABDOMINAL AND TRANSVAGINAL ULTRASOUND OF PELVIS TECHNIQUE: Both transabdominal and transvaginal ultrasound  examinations of the pelvis were performed. Transabdominal technique was performed for global imaging of the pelvis including uterus, ovaries, adnexal regions, and pelvic cul-de-sac. It was necessary to proceed with endovaginal exam following the transabdominal exam to visualize the endometrium and ovaries. COMPARISON:  None FINDINGS: Uterus Measurements: 8.7 x 4.7 x 5.7 cm = volume: 122 mL. No fibroids or other mass visualized. Endometrium Thickness: 12 mm.  No focal abnormality visualized. Right ovary Measurements: 2.7 x 1.6 x 2.6 cm = volume: 5.9 mL. Normal appearance/no adnexal mass. Left ovary Measurements: 2.9 x 2.3 x 2.7 cm = volume: 9.2 mL. Normal appearance/no adnexal mass. Other findings No abnormal free fluid. IMPRESSION: No evidence of uterine fibroids. Endometrial thickness measures 12 mm. If bleeding remains unresponsive to hormonal or medical therapy, sonohysterogram should be considered for focal lesion work-up. (Ref: Radiological Reasoning: Algorithmic Workup of Abnormal Vaginal Bleeding with Endovaginal Sonography and Sonohysterography. AJR 2008; [id number]) Normal appearance of both ovaries.  No adnexal mass identified. Electronically Signed   By: Marlaine Hind M.D.   On: 05/24/2019 17:50    Procedures Procedures (including critical care time)  Medications Ordered in ED Medications - No data to display  ED Course  I have reviewed the triage vital signs and the nursing notes.  Pertinent labs & imaging results that were available during my care of the patient were reviewed by me and considered in my medical decision making (see chart for details).    MDM Rules/Calculators/A&P                      24 year old female presents with heavy vaginal bleeding, she has been having issues with vaginal bleeding since October but since this morning bleeding has been much heavier than usual, passing blood clots and having to change pad  every hour.  Some mild cramping but no abdominal tenderness  on exam.  On pelvic exam patient has moderate blood pooled in the vaginal vault with some clots but no active or brisk bleeding.  Blood work is reassuring and shows no significant drop in hemoglobin, negative pregnancy.  Wet prep with a few WBCs seen, GC and chlamydia pending.  Will get ultrasound and then discuss with OB/GYN for any medical management of heavy bleeding.  Ultrasound is reassuring, no evidence of uterine fibroids, endometrial thickness is 12 mm, if bleeding remains unresponsive to hormonal therapy recommend a sonohysterogram for further work-up.  Case discussed with Dr. Shawnie Pons with OB/GYN who reviewed patient's recent OB/GYN visits, it looks like patient was actually supposed to begin a hormone taper with the birth control pack she was prescribed, patient seem to think she was supposed to start this as a regular birth control pack and has not begun it yet.  This should help control patient's bleeding.  She can follow-up with OB/GYN.  Dr. Shawnie Pons also recommends sending lab work for [employer] given continued bleeding.  This lab work was added on.  Discussed plan and made sure patient understood taper dosing with new birth control.  She expresses understanding and agreement.  She already has this prescription picked up at home.  Discharged home in good condition.  Final Clinical Impression(s) / ED Diagnoses Final diagnoses:  Vaginal bleeding    Rx / DC Orders ED Discharge Orders    None       Dartha Lodge, [place] 05/26/19 1427    Blane Ohara, MD 05/27/19 [phone]

## 2019-05-24 NOTE — Discharge Instructions (Signed)
Please take birth control taper pack as prescribed by Dr. Adrian Blackwater this should help significantly with your bleeding.  Take 3 tabs daily for the first 3 days, then take 2 tabs daily for 3 days, then 1 tab daily, skip the last week of pills in the pack and start a new pack.  Call Dr. Denyce Robert office for follow-up if bleeding does not improve after taking medications as directed.

## 2019-05-26 LAB — GC/CHLAMYDIA PROBE AMP (~~LOC~~) NOT AT ARMC
Chlamydia: NEGATIVE
Neisseria Gonorrhea: NEGATIVE

## 2019-05-26 MED ORDER — ONDANSETRON 4 MG PO TBDP: 4.0000 mg | ORAL_TABLET | Freq: Four times a day (QID) | ORAL | 0 refills | Status: DC | PRN

## 2019-05-28 ENCOUNTER — Encounter: Payer: Self-pay | Admitting: Family Medicine

## 2019-05-28 LAB — COAG STUDIES INTERP REPORT

## 2019-05-28 LAB — VON WILLEBRAND PANEL
Coagulation Factor VIII: 271 % — ABNORMAL HIGH (ref 56–140)
Ristocetin Co-factor, Plasma: 142 % (ref 50–200)
Von Willebrand Antigen, Plasma: 224 % — ABNORMAL HIGH (ref 50–200)

## 2019-05-31 ENCOUNTER — Other Ambulatory Visit: Payer: Self-pay

## 2019-05-31 ENCOUNTER — Encounter (HOSPITAL_BASED_OUTPATIENT_CLINIC_OR_DEPARTMENT_OTHER): Payer: Self-pay | Admitting: *Deleted

## 2019-05-31 ENCOUNTER — Emergency Department (HOSPITAL_BASED_OUTPATIENT_CLINIC_OR_DEPARTMENT_OTHER)
Admission: EM | Admit: 2019-05-31 | Discharge: 2019-05-31 | Disposition: A | Discharge status: Home / Self Care | Payer: PRIVATE HEALTH INSURANCE | Attending: Emergency Medicine | Admitting: Emergency Medicine

## 2019-05-31 DIAGNOSIS — Z91018 Allergy to other foods: Secondary | ICD-10-CM | POA: Diagnosis not present

## 2019-05-31 DIAGNOSIS — J45909 Unspecified asthma, uncomplicated: Secondary | ICD-10-CM | POA: Diagnosis not present

## 2019-05-31 DIAGNOSIS — Z79899 Other long term (current) drug therapy: Secondary | ICD-10-CM | POA: Insufficient documentation

## 2019-05-31 DIAGNOSIS — M546 Pain in thoracic spine: Secondary | ICD-10-CM | POA: Insufficient documentation

## 2019-05-31 MED ORDER — DEXAMETHASONE 6 MG PO TABS
10.0000 mg | ORAL_TABLET | Freq: Once | ORAL | Status: AC
  Administered 2019-05-31: 10 mg via ORAL
  Filled 2019-05-31: qty 1

## 2019-05-31 MED ORDER — IBUPROFEN 800 MG PO TABS: 800.0000 mg | ORAL_TABLET | Freq: Three times a day (TID) | ORAL | 0 refills | Status: AC | PRN

## 2019-05-31 MED ORDER — METHOCARBAMOL 500 MG PO TABS: 500.0000 mg | ORAL_TABLET | Freq: Two times a day (BID) | ORAL | 0 refills | Status: DC | PRN

## 2019-05-31 NOTE — ED Triage Notes (Signed)
Pt reports a patient fell on her at work this afternoon. C/o of pain in upper back

## 2019-05-31 NOTE — Discharge Instructions (Addendum)
Your back pain should be treated with medicines such as ibuprofen or tylenol and this back pain should get better over the next 2 weeks.   Follow Up: Please follow up with your primary healthcare provider in 1-2 weeks for reassessment.  Back pain is discomfort that may be due to injuries to muscles and ligaments around the spine. Occasionally, it may be caused by a a problem to a part of the spine called a disc. The pain may last several days or a week;  However, most patients get completely well in 4 weeks.   1. Medications: Alternate 800 mg of ibuprofen and [id number] mg of Tylenol every 3 hours as needed for pain. Do not exceed 4000 mg of Tylenol daily.  Take ibuprofen with food to avoid upset stomach issues.   Muscle relaxants:  These medications can help with muscle tightness that is a cause of lower back pain. Most of these medications can cause drowsiness, and it is not safe to drive or use dangerous machinery while taking them.You can take Robaxin as needed for muscle spasm up to twice daily but do not drive, drink alcohol, or operate heavy machinery while taking this medicine because it may make you drowsy.  I typically recommend taking this medicine only at night when you are going to sleep.  You can also cut these tablets in half if they make you feel very drowsy.  2. Treatment: rest, drink plenty of fluids, gentle stretching as discussed (see attached), alternate ice and heat (or stick with whichever feels best) 20 minutes on 20 minutes off. Maintaining your daily activities, including walking, is encourged, as it will help you get better faster than just staying in bed.    Be aware that if you develop new symptoms, such as a fever, leg weakness, difficulty with or loss of control of your urine or bowels, abdominal pain, or more severe pain, you will need to seek medical attention immediately and  / or return to the Emergency department.

## 2019-05-31 NOTE — ED Notes (Signed)
ED Provider at bedside. 

## 2019-05-31 NOTE — ED Provider Notes (Signed)
[facility] EMERGENCY DEPARTMENT Provider Note   CSN: [id number] Arrival date & time: 05/31/19  1939     History Chief Complaint  Patient presents with  . Back Pain    Brianna Byrd is a 24 y.o. female with past medical history of asthma and GERD presents to emergency department today with chief complaint of acute onset of back pain.  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 To avoid letting the patient fall she tried to brace patient with her legs while supporting patient with her arms.  After getting patient safely to the chair she noticed sharp pain across the middle of her back.  She denies any pain at rest stating it is only present with movement.  She rates the pain 6 out of 10 in severity.  She did not take any medications for symptoms prior to arrival.  She herself denies falling.  She denies fever, chills, chest pain, shortness of breath, abdominal pain, nausea, vomiting, numbness, tingling, weakness.  No red flag symptoms.  History provided by patient with additional history obtained from chart review.     Past Medical History:  Diagnosis Date  . Asthma   . Reflux     Patient Active Problem List   Diagnosis Date Noted  . Migraine without aura and without status migrainosus, not intractable 03/04/2019  . Left breast mass 01/27/2019  . History of pre-eclampsia 02/12/2017  . SVD (spontaneous vaginal delivery) 12/26/2016    Past Surgical History:  Procedure Laterality Date  . NO PAST SURGERIES       OB History    Gravida  1   Para  1   Term  1   Preterm      AB      Living  1     SAB      TAB      Ectopic      Multiple  0   Live Births  1           Family History  Problem Relation Age of Onset  . Diabetes Father   . Hypertension Mother   . Diabetes Sister   . Cancer Neg Hx     Social History   Tobacco Use  . Smoking status:  Never Smoker  . Smokeless tobacco: Never Used  Substance Use Topics  . Alcohol use: No  . Drug use: No    Home Medications Prior to Admission medications   Medication Sig Start Date End Date Taking? Authorizing Provider  ferrous gluconate (FERGON) 324 MG tablet Take 1 tablet (324 mg total) by mouth 2 (two) times daily with a meal. 03/27/19   Truett Mainland, DO  hydrocortisone 2.5 % cream Apply topically 2 (two) times daily. 05/22/19   Veryl Speak, MD  ibuprofen (ADVIL) 800 MG tablet Take 1 tablet (800 mg total) by mouth 3 (three) times daily as needed for up to 7 days. 05/31/19 06/07/19  Alexander Aument E, PA-C  methocarbamol (ROBAXIN) 500 MG tablet Take 1 tablet (500 mg total) by mouth 2 (two) times daily as needed for muscle spasms. 05/31/19   Romello Hoehn E, PA-C  norgestimate-ethinyl estradiol (ORTHO-CYCLEN) 0.25-35 MG-MCG tablet Take 1 tablet by mouth daily. 3 tabs daily for 3 days, then 2 tabs daily for 3 days, then one tab daily. Skip the last week of pills and start a new pack. 05/22/19   Stinson,  Rhona Raider, DO  ondansetron (ZOFRAN ODT) 4 MG disintegrating tablet Take 1 tablet (4 mg total) by mouth every 6 (six) hours as needed for nausea. 05/26/19   Levie Heritage, DO  pantoprazole (PROTONIX) 20 MG tablet Take 1 tablet (20 mg total) by mouth daily. 05/22/19   Geoffery Lyons, MD  fluticasone (FLONASE) 50 MCG/ACT nasal spray Place 2 sprays into both nostrils daily. Patient not taking: Reported on 01/27/2019 06/05/18 05/01/19  Palumbo, April, MD  medroxyPROGESTERone (PROVERA) 10 MG tablet Take 1 tablet (10 mg total) by mouth daily for 7 days. 03/04/19 05/01/19  Sharlene Dory, DO  norethindrone (ORTHO MICRONOR) 0.35 MG tablet Take 1 tablet (0.35 mg total) by mouth daily. 03/27/19 05/22/19  Levie Heritage, DO  SUMAtriptan (IMITREX) 100 MG tablet Take 1 tablet (100 mg total) by mouth every 2 (two) hours as needed for migraine. May repeat in 2 hours if headache persists or recurs. Patient  not taking: Reported on 03/27/2019 03/04/19 05/01/19  Sharlene Dory, DO    Allergies    Peach [prunus persica]  Review of Systems   Review of Systems All other systems are reviewed and are negative for acute change except as noted in the HPI.  Physical Exam Updated Vital Signs BP 102/74 (BP Location: Left Wrist)   Pulse (!) 102   Temp 98.5 F (36.9 C) (Oral)   Resp 16   Ht 5\' 2"  (1.575 m)   Wt 89.4 kg   SpO2 100%   BMI 36.03 kg/m   Physical Exam Vitals and nursing note reviewed.  Constitutional:      General: She is not in acute distress.    Appearance: She is not ill-appearing.  HENT:     Head: Normocephalic and atraumatic.     Right Ear: Tympanic membrane and external ear normal.     Left Ear: Tympanic membrane and external ear normal.     Nose: Nose normal.     Mouth/Throat:     Mouth: Mucous membranes are moist.     Pharynx: Oropharynx is clear.  Eyes:     General: No scleral icterus.       Right eye: No discharge.        Left eye: No discharge.     Extraocular Movements: Extraocular movements intact.     Conjunctiva/sclera: Conjunctivae normal.     Pupils: Pupils are equal, round, and reactive to light.  Neck:     Vascular: No JVD.     Comments: Full ROM intact without spinous process TTP. No bony stepoffs or deformities, no paraspinous muscle TTP or muscle spasms. No bruising, erythema, or swelling.  Cardiovascular:     Rate and Rhythm: Regular rhythm. Tachycardia present.     Pulses: Normal pulses.          Radial pulses are 2+ on the right side and 2+ on the left side.     Heart sounds: Normal heart sounds.     Comments: Heart rate ranging from 98-101 during exam Pulmonary:     Comments: Lungs clear to auscultation in all fields. Symmetric chest rise. No wheezing, rales, or rhonchi. Abdominal:     Comments: Abdomen is soft, non-distended, and non-tender in all quadrants. No rigidity, no guarding. No peritoneal signs.  Musculoskeletal:         General: Normal range of motion.     Cervical back: Normal range of motion.       Back:     Comments: Moving all extremities  without signs of injury. Full range of motion of thoracic and lumbar spine.  Tenderness to palpation of thoracic paraspinal muscles as depicted in image above.  No step-off or deformity, no crepitus.  No midline tenderness.  No tenderness to palpation of lumbar spine.  Skin:    General: Skin is warm and dry.     Capillary Refill: Capillary refill takes less than 2 seconds.  Neurological:     Mental Status: She is oriented to person, place, and time.     GCS: GCS eye subscore is 4. GCS verbal subscore is 5. GCS motor subscore is 6.     Comments: Fluent speech, no facial droop.  Sensation grossly intact to light touch in the lower extremities bilaterally. No saddle anesthesias. Strength 5/5 with flexion and extension at the bilateral hips, knees, and ankles. No noted gait deficit.    Psychiatric:        Behavior: Behavior normal.       ED Results / Procedures / Treatments   Labs (all labs ordered are listed, but only abnormal results are displayed) Labs Reviewed - No data to display  EKG None  Radiology No results found.  Procedures Procedures (including critical care time)  Medications Ordered in ED Medications  dexamethasone (DECADRON) tablet 10 mg (10 mg Oral Given 05/31/19 2056)    ED Course  I have reviewed the triage vital signs and the nursing notes.  Pertinent labs & imaging results that were available during my care of the patient were reviewed by me and considered in my medical decision making (see chart for details).    MDM Rules/Calculators/A&P                      Patient with back pain.  No neurological deficits and normal neuro exam.  Patient can walk without difficulty.  Pain is reproducible on exam.  History and exam are consistent with MSK strain.  No loss of bowel or bladder control.  No concern for cauda equina.  No  fever, night sweats, weight loss, h/o cancer, IVDU.  RICE protocol and pain medicine indicated and discussed with patient.  Patient discharged with short course of Robaxin and ibuprofen. She is aware not to work or drive while taking. She denies chance of pregnancy, menses just ended. Politely refuses pregnancy test as she had negative test on 05/24/2019 at ED visit.  Engaged in shared decision making regarding x-ray of thoracis spine, with her reassuring exam and without difficulty ambulating do not feel emergent imaging is needed at this time, patient agrees.  The patient appears reasonably screened and/or stabilized for discharge and I doubt any other medical condition or other [facility] requiring further screening, evaluation, or treatment in the ED at this time prior to discharge. The patient is safe for discharge with strict return precautions discussed. Recommend pcp follow up if symptoms persist.   Portions of this note were generated with Dragon dictation software. Dictation errors may occur despite best attempts at proofreading.    Final Clinical Impression(s) / ED Diagnoses Final diagnoses:  Acute bilateral thoracic back pain    Rx / DC Orders ED Discharge Orders         Ordered    ibuprofen (ADVIL) 800 MG tablet  3 times daily PRN     05/31/19 2100    methocarbamol (ROBAXIN) 500 MG tablet  2 times daily PRN     05/31/19 2101  Kathyrn Lass 05/31/19 2318    Maia Plan, MD 06/01/19 2023

## 2019-06-17 ENCOUNTER — Ambulatory Visit: Payer: Medicaid Other | Admitting: Family Medicine

## 2019-06-18 ENCOUNTER — Emergency Department (HOSPITAL_BASED_OUTPATIENT_CLINIC_OR_DEPARTMENT_OTHER): Payer: Self-pay

## 2019-06-18 ENCOUNTER — Encounter (HOSPITAL_BASED_OUTPATIENT_CLINIC_OR_DEPARTMENT_OTHER): Payer: Self-pay | Admitting: Emergency Medicine

## 2019-06-18 ENCOUNTER — Ambulatory Visit: Payer: Medicaid Other | Admitting: Family Medicine

## 2019-06-18 ENCOUNTER — Emergency Department (HOSPITAL_BASED_OUTPATIENT_CLINIC_OR_DEPARTMENT_OTHER)
Admission: EM | Admit: 2019-06-18 | Discharge: 2019-06-18 | Disposition: A | Discharge status: Home / Self Care | Payer: Self-pay | Attending: Emergency Medicine | Admitting: Emergency Medicine

## 2019-06-18 ENCOUNTER — Other Ambulatory Visit: Payer: Self-pay

## 2019-06-18 DIAGNOSIS — D649 Anemia, unspecified: Secondary | ICD-10-CM

## 2019-06-18 DIAGNOSIS — J45909 Unspecified asthma, uncomplicated: Secondary | ICD-10-CM | POA: Insufficient documentation

## 2019-06-18 DIAGNOSIS — R079 Chest pain, unspecified: Secondary | ICD-10-CM | POA: Insufficient documentation

## 2019-06-18 DIAGNOSIS — R071 Chest pain on breathing: Secondary | ICD-10-CM | POA: Insufficient documentation

## 2019-06-18 HISTORY — DX: Obesity, unspecified: E66.9

## 2019-06-18 LAB — BASIC METABOLIC PANEL
Anion gap: 8 (ref 5–15)
BUN: 5 mg/dL — ABNORMAL LOW (ref 6–20)
CO2: 24 mmol/L (ref 22–32)
Calcium: 8.5 mg/dL — ABNORMAL LOW (ref 8.9–10.3)
Chloride: 106 mmol/L (ref 98–111)
Creatinine, Ser: 0.64 mg/dL (ref 0.44–1.00)
GFR calc Af Amer: >60 mL/min (ref >60)
GFR calc non Af Amer: >60 mL/min (ref >60)
Glucose, Bld: 96 mg/dL (ref 70–99)
Potassium: 3.7 mmol/L (ref 3.5–5.1)
Sodium: 138 mmol/L (ref 135–145)

## 2019-06-18 LAB — CBC WITH DIFFERENTIAL/PLATELET
Abs Immature Granulocytes: 0.01 10*3/uL (ref 0.00–0.07)
Basophils Absolute: 0 10*3/uL (ref 0.0–0.1)
Basophils Relative: 0 %
Eosinophils Absolute: 0.1 10*3/uL (ref 0.0–0.5)
Eosinophils Relative: 2 %
HCT: 31.6 % — ABNORMAL LOW (ref 36.0–46.0)
Hemoglobin: 9.3 g/dL — ABNORMAL LOW (ref 12.0–15.0)
Immature Granulocytes: 0 %
Lymphocytes Relative: 27 %
Lymphs Abs: 1.9 10*3/uL (ref 0.7–4.0)
MCH: 22.8 pg — ABNORMAL LOW (ref 26.0–34.0)
MCHC: 29.4 g/dL — ABNORMAL LOW (ref 30.0–36.0)
MCV: 77.5 fL — ABNORMAL LOW (ref 80.0–100.0)
Monocytes Absolute: 0.6 10*3/uL (ref 0.1–1.0)
Monocytes Relative: 8 %
Neutro Abs: 4.5 10*3/uL (ref 1.7–7.7)
Neutrophils Relative %: 63 %
Platelets: 469 10*3/uL — ABNORMAL HIGH (ref 150–400)
RBC: 4.08 MIL/uL (ref 3.87–5.11)
RDW: 15.2 % (ref 11.5–15.5)
WBC: 7.1 10*3/uL (ref 4.0–10.5)
nRBC: 0 % (ref 0.0–0.2)

## 2019-06-18 LAB — PREGNANCY, URINE: Preg Test, Ur: NEGATIVE

## 2019-06-18 LAB — D-DIMER, QUANTITATIVE: D-Dimer, Quant: 0.82 ug/mL-FEU — ABNORMAL HIGH (ref 0.00–0.50)

## 2019-06-18 MED ORDER — KETOROLAC TROMETHAMINE 15 MG/ML IJ SOLN
15.0000 mg | Freq: Once | INTRAMUSCULAR | Status: AC
  Administered 2019-06-18: 15 mg via INTRAVENOUS
  Filled 2019-06-18: qty 1

## 2019-06-18 MED ORDER — NAPROXEN 500 MG PO TABS: 500.0000 mg | ORAL_TABLET | Freq: Two times a day (BID) | ORAL | 0 refills | Status: DC

## 2019-06-18 MED ORDER — ACETAMINOPHEN 325 MG PO TABS
650.0000 mg | ORAL_TABLET | Freq: Once | ORAL | Status: AC
  Administered 2019-06-18: 650 mg via ORAL
  Filled 2019-06-18: qty 2

## 2019-06-18 MED ORDER — IOHEXOL 350 MG/ML SOLN
100.0000 mL | Freq: Once | INTRAVENOUS | Status: AC | PRN
  Administered 2019-06-18: 100 mL via INTRAVENOUS

## 2019-06-18 MED ORDER — METHOCARBAMOL 500 MG PO TABS: 500.0000 mg | ORAL_TABLET | Freq: Three times a day (TID) | ORAL | 0 refills | Status: DC | PRN

## 2019-06-18 MED ORDER — SODIUM CHLORIDE 0.9 % IV BOLUS
1000.0000 mL | Freq: Once | INTRAVENOUS | Status: AC
  Administered 2019-06-18: 1000 mL via INTRAVENOUS

## 2019-06-18 MED ORDER — ACETAMINOPHEN 325 MG PO TABS
ORAL_TABLET | ORAL | Status: AC
  Filled 2019-06-18: qty 1

## 2019-06-18 MED ORDER — FERROUS SULFATE 325 (65 FE) MG PO TABS: 325.0000 mg | ORAL_TABLET | Freq: Every day | ORAL | 0 refills | Status: DC

## 2019-06-18 NOTE — ED Provider Notes (Signed)
17:30: Assumed care of patient from Fayrene Helper PA-C at change of shift pending labs, CXR, & disposition.   Please see prior provider note for full H&P.  Briefly patient is a 24 yo female who presented to the ED with complaints of R sided chest pain that started 1.5 hours PTA while she was changing her clothing. Takes OCP.   Physical Exam  BP 123/84 (BP Location: Right Arm)   Pulse (!) 120   Temp 99.1 F (37.3 C) (Oral)   Resp 20   Ht 5\' 1"  (1.549 m)   LMP 06/10/2019   SpO2 100%   BMI 37.22 kg/m   Physical Exam Vitals and nursing note reviewed.  Constitutional:      General: She is not in acute distress.    Appearance: She is well-developed.  HENT:     Head: Normocephalic and atraumatic.  Eyes:     General:        Right eye: No discharge.        Left eye: No discharge.     Conjunctiva/sclera: Conjunctivae normal.  Cardiovascular:     Rate and Rhythm: Normal rate and regular rhythm.     Heart sounds: Normal heart sounds.  Chest:     Chest wall: Tenderness (R anterior chest wall) present.  Neurological:     Mental Status: She is alert.     Comments: Clear speech.   Psychiatric:        Behavior: Behavior normal.        Thought Content: Thought content normal.     ED Course/Procedures     Results for orders placed or performed during the hospital encounter of 06/18/19  Basic metabolic panel  Result Value Ref Range   Sodium 138 135 - 145 mmol/L   Potassium 3.7 3.5 - 5.1 mmol/L   Chloride 106 98 - 111 mmol/L   CO2 24 22 - 32 mmol/L   Glucose, Bld 96 70 - 99 mg/dL   BUN 5 (L) 6 - 20 mg/dL   Creatinine, Ser 08/18/19 0.44 - 1.00 mg/dL   Calcium 8.5 (L) 8.9 - 10.3 mg/dL   GFR calc non Af Amer >60 >60 mL/min   GFR calc Af Amer >60 >60 mL/min   Anion gap 8 5 - 15  CBC with Differential  Result Value Ref Range   WBC 7.1 4.0 - 10.5 K/uL   RBC 4.08 3.87 - 5.11 MIL/uL   Hemoglobin 9.3 (L) 12.0 - 15.0 g/dL   HCT 5.36 (L) 64.4 - 03.4 %   MCV 77.5 (L) 80.0 - 100.0 fL   MCH  22.8 (L) 26.0 - 34.0 pg   MCHC 29.4 (L) 30.0 - 36.0 g/dL   RDW 74.2 59.5 - 63.8 %   Platelets 469 (H) 150 - 400 K/uL   nRBC 0.0 0.0 - 0.2 %   Neutrophils Relative % 63 %   Neutro Abs 4.5 1.7 - 7.7 K/uL   Lymphocytes Relative 27 %   Lymphs Abs 1.9 0.7 - 4.0 K/uL   Monocytes Relative 8 %   Monocytes Absolute 0.6 0.1 - 1.0 K/uL   Eosinophils Relative 2 %   Eosinophils Absolute 0.1 0.0 - 0.5 K/uL   Basophils Relative 0 %   Basophils Absolute 0.0 0.0 - 0.1 K/uL   Immature Granulocytes 0 %   Abs Immature Granulocytes 0.01 0.00 - 0.07 K/uL  D-dimer, quantitative  Result Value Ref Range   D-Dimer, Quant 0.82 (H) 0.00 - 0.50 ug/mL-FEU  Pregnancy, urine  Result Value Ref Range   Preg Test, Ur NEGATIVE NEGATIVE   CT Angio Chest PE W/Cm &/Or Wo Cm  Result Date: 06/18/2019 CLINICAL DATA:  Right-sided chest pain EXAM: CT ANGIOGRAPHY CHEST WITH CONTRAST TECHNIQUE: Multidetector CT imaging of the chest was performed using the standard protocol during bolus administration of intravenous contrast. Multiplanar CT image reconstructions and MIPs were obtained to evaluate the vascular anatomy. CONTRAST:  OMNIPAQUE IOHEXOL 350 MG/ML SOLN COMPARISON:  Chest x-ray 06/18/2019 FINDINGS: Cardiovascular: Satisfactory opacification of the pulmonary arteries to the segmental level. No evidence of pulmonary embolism. Normal heart size. No pericardial effusion. Nonaneurysmal aorta. Mediastinum/Nodes: No enlarged mediastinal, hilar, or axillary lymph nodes. Thyroid gland, trachea, and esophagus demonstrate no significant findings. Lungs/Pleura: Lungs are clear. No pleural effusion or pneumothorax. Upper Abdomen: No acute abnormality. Musculoskeletal: No chest wall abnormality. No acute or significant osseous findings. Review of the MIP images confirms the above findings. IMPRESSION: Negative. No CT evidence for acute pulmonary embolus. Clear lung fields Electronically Signed   By: Jasmine Pang M.D.   On: 06/18/2019  18:38   DG Chest Port 1 View  Result Date: 06/18/2019 CLINICAL DATA:  Right chest pain EXAM: PORTABLE CHEST 1 VIEW COMPARISON:  04/06/2019 chest radiograph. FINDINGS: Stable cardiomediastinal silhouette with normal heart size. No pneumothorax. No pleural effusion. Lungs appear clear, with no acute consolidative airspace disease and no pulmonary edema. IMPRESSION: No active disease. Electronically Signed   By: Delbert Phenix M.D.   On: 06/18/2019 17:59   US PELVIC COMPLETE WITH TRANSVAGINAL  Result Date: 05/24/2019 CLINICAL DATA:  Heavy vaginal bleeding for several months. EXAM: TRANSABDOMINAL AND TRANSVAGINAL ULTRASOUND OF PELVIS TECHNIQUE: Both transabdominal and transvaginal ultrasound examinations of the pelvis were performed. Transabdominal technique was performed for global imaging of the pelvis including uterus, ovaries, adnexal regions, and pelvic cul-de-sac. It was necessary to proceed with endovaginal exam following the transabdominal exam to visualize the endometrium and ovaries. COMPARISON:  None FINDINGS: Uterus Measurements: 8.7 x 4.7 x 5.7 cm = volume: 122 mL. No fibroids or other mass visualized. Endometrium Thickness: 12 mm.  No focal abnormality visualized. Right ovary Measurements: 2.7 x 1.6 x 2.6 cm = volume: 5.9 mL. Normal appearance/no adnexal mass. Left ovary Measurements: 2.9 x 2.3 x 2.7 cm = volume: 9.2 mL. Normal appearance/no adnexal mass. Other findings No abnormal free fluid. IMPRESSION: No evidence of uterine fibroids. Endometrial thickness measures 12 mm. If bleeding remains unresponsive to hormonal or medical therapy, sonohysterogram should be considered for focal lesion work-up. (Ref: Radiological Reasoning: Algorithmic Workup of Abnormal Vaginal Bleeding with Endovaginal Sonography and Sonohysterography. AJR 2008; 161:W96-04) Normal appearance of both ovaries.  No adnexal mass identified. Electronically Signed   By: Danae Orleans M.D.   On: 05/24/2019 17:50   US ABDOMEN LIMITED  RUQ  Result Date: 05/23/2019 CLINICAL DATA:  24 year old female with history of left upper quadrant abdominal pain radiating into the left upper back. EXAM: ULTRASOUND ABDOMEN LIMITED RIGHT UPPER QUADRANT COMPARISON:  Abdominal ultrasound 02/26/2017. FINDINGS: Gallbladder: No gallstones or wall thickening visualized. No sonographic Murphy sign noted by sonographer. Common bile duct: Diameter: 2 mm Liver: No focal lesion identified. Within normal limits in parenchymal echogenicity. Portal vein is patent on color Doppler imaging with normal direction of blood flow towards the liver. Other: None. IMPRESSION: 1. No acute findings. Specifically, no gallstones or findings of acute cholecystitis. Electronically Signed   By: Trudie Reed M.D.   On: 05/23/2019 10:55     Procedures  MDM  CBC: Anemia somewhat worsening from prior.  No leukocytosis. BMP: Mild hypocalcemia, no significant electrolyte derangement. Pregnancy test: Negative D-dimer: Positive, subsequent CTA negative for PE. EKG reveals sinus tachycardia, no significant ischemic changes, do not suspect ACS.  Discussed anemia w/ patient- she states she had some issues with her birth control and uncontrolled vaginal bleeding for a few months- being followed by her outpatient doctors who are adjusting this- likely case of anemia- will start iron supplement.   Regarding chest pain, CTA w/o PE, EKG without ischemic changes to raise concern for ACS, no widened mediastinum on imaging do not suspect dissection. Reproducible with chest wall palpation- suspect MSK. Tachycardia normalized. Will discharge home with naproxen & robaxin- discussed no driving/operating heavy machinery with robaxin. I discussed results, treatment plan, need for follow-up, and return precautions with the patient. Provided opportunity for questions, patient confirmed understanding and is in agreement with plan.         Leafy Kindle 06/18/19 1900     Lucrezia Starch, MD 06/20/19 Lynnell Catalan

## 2019-06-18 NOTE — ED Provider Notes (Signed)
[facility] EMERGENCY DEPARTMENT Provider Note   CSN: [id number] Arrival date & time: 06/18/19  1657     History No chief complaint on file.   Brianna Byrd is a 24 y.o. female.  The history is provided by the patient. No language interpreter was used.  Chest Pain    24 year old female with history of GERD, asthma, presenting for evaluation of chest pain.  Patient developed acute onset of right-sided pain to her chest that started approximately an hour and a half ago.  Pain is sharp, nonradiating, worse with shoulder movement with any kind of movement.  Increasing pain when she takes a deep breath.  Pain is moderate in severity.  No associated fever chills no lightheadedness or dizziness no productive cough or hemoptysis no abdominal pain nausea vomiting diarrhea or rash.  No heavy lifting or strength activities recently.  No prior history of PE or DVT however patient does take birth control pill.  No significant cardiac history, denies tobacco use.  No recent Covid exposure.  She did report having some mild tightness to her right leg for the last few days.  Past Medical History:  Diagnosis Date  . Asthma   . Reflux     Patient Active Problem List   Diagnosis Date Noted  . Migraine without aura and without status migrainosus, not intractable 03/04/2019  . Left breast mass 01/27/2019  . History of pre-eclampsia 02/12/2017  . SVD (spontaneous vaginal delivery) 12/26/2016    Past Surgical History:  Procedure Laterality Date  . NO PAST SURGERIES       OB History    Gravida  1   Para  1   Term  1   Preterm      AB      Living  1     SAB      TAB      Ectopic      Multiple  0   Live Births  1           Family History  Problem Relation Age of Onset  . Diabetes Father   . Hypertension Mother   . Diabetes Sister   . Cancer Neg Hx     Social History   Tobacco Use  . Smoking status: Never Smoker  . Smokeless tobacco: Never Used    Substance Use Topics  . Alcohol use: No  . Drug use: No    Home Medications Prior to Admission medications   Medication Sig Start Date End Date Taking? Authorizing Provider  ferrous gluconate (FERGON) 324 MG tablet Take 1 tablet (324 mg total) by mouth 2 (two) times daily with a meal. 03/27/19   Levie Heritage, DO  hydrocortisone 2.5 % cream Apply topically 2 (two) times daily. 05/22/19   Geoffery Lyons, MD  methocarbamol (ROBAXIN) 500 MG tablet Take 1 tablet (500 mg total) by mouth 2 (two) times daily as needed for muscle spasms. 05/31/19   Albrizze, Kaitlyn E, PA-C  norgestimate-ethinyl estradiol (ORTHO-CYCLEN) 0.25-35 MG-MCG tablet Take 1 tablet by mouth daily. 3 tabs daily for 3 days, then 2 tabs daily for 3 days, then one tab daily. Skip the last week of pills and start a new pack. 05/22/19   Levie Heritage, DO  ondansetron (ZOFRAN ODT) 4 MG disintegrating tablet Take 1 tablet (4 mg total) by mouth every 6 (six) hours as needed for nausea. 05/26/19   Levie Heritage, DO  pantoprazole (PROTONIX) 20 MG tablet Take 1 tablet (20 mg  total) by mouth daily. 05/22/19   Veryl Speak, MD  fluticasone (FLONASE) 50 MCG/ACT nasal spray Place 2 sprays into both nostrils daily. Patient not taking: Reported on 01/27/2019 06/05/18 05/01/19  Palumbo, April, MD  medroxyPROGESTERone (PROVERA) 10 MG tablet Take 1 tablet (10 mg total) by mouth daily for 7 days. 03/04/19 05/01/19  Shelda Pal, DO  norethindrone (ORTHO MICRONOR) 0.35 MG tablet Take 1 tablet (0.35 mg total) by mouth daily. 03/27/19 05/22/19  Truett Mainland, DO  SUMAtriptan (IMITREX) 100 MG tablet Take 1 tablet (100 mg total) by mouth every 2 (two) hours as needed for migraine. May repeat in 2 hours if headache persists or recurs. Patient not taking: Reported on 03/27/2019 03/04/19 05/01/19  Shelda Pal, DO    Allergies    Peach [prunus persica]  Review of Systems   Review of Systems  Cardiovascular: Positive for chest pain.  All  other systems reviewed and are negative.   Physical Exam Updated Vital Signs There were no vitals taken for this visit.  Physical Exam Vitals and nursing note reviewed.  Constitutional:      General: She is not in acute distress.    Appearance: She is well-developed. She is obese.  HENT:     Head: Atraumatic.  Eyes:     Conjunctiva/sclera: Conjunctivae normal.  Cardiovascular:     Rate and Rhythm: Regular rhythm. Tachycardia present.     Heart sounds: Normal heart sounds.  Pulmonary:     Effort: Pulmonary effort is normal.     Breath sounds: Normal breath sounds. No decreased breath sounds, wheezing, rhonchi or rales.  Chest:     Chest wall: Tenderness (Tenderness to right anterior upper chest wall on palpation without any crepitus emphysema.) present.  Abdominal:     Palpations: Abdomen is soft.  Musculoskeletal:     Cervical back: Neck supple.     Right lower leg: No edema.     Left lower leg: No edema.  Skin:    Findings: No rash.  Neurological:     Mental Status: She is alert.  Psychiatric:        Mood and Affect: Mood normal.     ED Results / Procedures / Treatments   Labs (all labs ordered are listed, but only abnormal results are displayed) Labs Reviewed - No data to display  EKG None  ED ECG REPORT   Date: 06/18/2019  Rate: 105  Rhythm: sinus tachycardia  QRS Axis: normal  Intervals: normal  ST/T Wave abnormalities: normal  Conduction Disutrbances:none  Narrative Interpretation:   Old EKG Reviewed: none available  I have personally reviewed the EKG tracing and agree with the computerized printout as noted.   Radiology No results found.  Procedures Procedures (including critical care time)  Medications Ordered in ED Medications - No data to display  ED Course  I have reviewed the triage vital signs and the nursing notes.  Pertinent labs & imaging results that were available during my care of the patient were reviewed by me and considered  in my medical decision making (see chart for details).    MDM Rules/Calculators/A&P                      BP 123/84 (BP Location: Right Arm)   Pulse (!) 120   Temp 99.1 F (37.3 C) (Oral)   Resp 20   Ht 5\' 1"  (1.549 m)   LMP 06/10/2019   SpO2 100%   BMI 37.22 kg/m  Final Clinical Impression(s) / ED Diagnoses Final diagnoses:  None    Rx / DC Orders ED Discharge Orders    None     5:22 PM Patient here with acute onset of right-sided chest wall pain reproducible on exam.  Patient however does have some risk factor for PE which includes on birth control, and is currently tachycardic with heart rate of 120.  Will obtain D-dimer.  Low suspicion for ACS.  Suspect muscle skeletal pain.  5:38 PM Pt sign out to oncoming team who will f/u on labs result and determine disposition.    Fayrene Helper, PA-C 06/18/19 1738    Milagros Loll, MD 06/20/19 [phone]

## 2019-06-18 NOTE — ED Triage Notes (Signed)
Right sided sharp chest pain x30 min.  Worse with movement.  Pain in right side of back also.

## 2019-06-18 NOTE — Discharge Instructions (Addendum)
You were seen in the emergency department today for chest pain.  Your EKG was reassuring.  The CT scan we did did not show a blood clot.  Your labs did show that you are somewhat anemic with a hemoglobin of 9.3-we are starting you on an iron supplement for this to take daily, please have this rechecked by your primary care provider within 1 to 2 weeks.  We are unsure the exact cause of your chest pain, this may be a muscular issue given it hurts when we press on your chest.  We are sending you home with the following medicines to treat this:   - Naproxen is a nonsteroidal anti-inflammatory medication that will help with pain and swelling. Be sure to take this medication as prescribed with food, 1 pill every 12 hours,  It should be taken with food, as it can cause stomach upset, and more seriously, stomach bleeding. Do not take other nonsteroidal anti-inflammatory medications with this such as Advil, Motrin, Aleve, Mobic, Goodie Powder, or Motrin.    - Robaxin is the muscle relaxer I have prescribed, this is meant to help with muscle tightness. Be aware that this medication may make you drowsy therefore the first time you take this it should be at a time you are in an environment where you can rest. Do not drive or operate heavy machinery when taking this medication. Do not drink alcohol or take other sedating medications with this medicine such as narcotics or benzodiazepines.   You make take Tylenol per over the counter dosing with these medications.   We have prescribed you new medication(s) today. Discuss the medications prescribed today with your pharmacist as they can have adverse effects and interactions with your other medicines including over the counter and prescribed medications. Seek medical evaluation if you start to experience new or abnormal symptoms after taking one of these medicines, seek care immediately if you start to experience difficulty breathing, feeling of your throat closing,  facial swelling, or rash as these could be indications of a more serious allergic reaction   Please follow-up with your primary care provider within 3 days for reevaluation of your symptoms.  Return to the emergency department for new or worsening symptoms including but not limited to worsening pain, lightheadedness, dizziness, passing out, trouble breathing, coughing up blood, fever, or any other concerns.

## 2019-06-22 ENCOUNTER — Other Ambulatory Visit: Payer: Self-pay

## 2019-06-23 ENCOUNTER — Ambulatory Visit: Payer: Medicaid Other | Admitting: Family Medicine

## 2019-06-26 ENCOUNTER — Ambulatory Visit: Payer: Medicaid Other | Admitting: Family Medicine

## 2019-06-26 ENCOUNTER — Encounter: Payer: Self-pay | Admitting: Family Medicine

## 2019-07-06 ENCOUNTER — Encounter: Payer: Self-pay | Admitting: Physician Assistant

## 2019-07-14 ENCOUNTER — Emergency Department (HOSPITAL_COMMUNITY)
Admission: EM | Admit: 2019-07-14 | Discharge: 2019-07-14 | Disposition: A | Discharge status: Home / Self Care | Payer: Medicaid Other | Attending: Emergency Medicine | Admitting: Emergency Medicine

## 2019-07-14 ENCOUNTER — Other Ambulatory Visit: Payer: Self-pay

## 2019-07-14 ENCOUNTER — Encounter (HOSPITAL_COMMUNITY): Payer: Self-pay | Admitting: *Deleted

## 2019-07-14 DIAGNOSIS — R519 Headache, unspecified: Secondary | ICD-10-CM | POA: Insufficient documentation

## 2019-07-14 DIAGNOSIS — J45909 Unspecified asthma, uncomplicated: Secondary | ICD-10-CM | POA: Insufficient documentation

## 2019-07-14 DIAGNOSIS — R002 Palpitations: Secondary | ICD-10-CM

## 2019-07-14 DIAGNOSIS — Z79899 Other long term (current) drug therapy: Secondary | ICD-10-CM | POA: Insufficient documentation

## 2019-07-14 DIAGNOSIS — R42 Dizziness and giddiness: Secondary | ICD-10-CM | POA: Insufficient documentation

## 2019-07-14 LAB — BASIC METABOLIC PANEL
Anion gap: 6 (ref 5–15)
BUN: 10 mg/dL (ref 6–20)
CO2: 24 mmol/L (ref 22–32)
Calcium: 8.6 mg/dL — ABNORMAL LOW (ref 8.9–10.3)
Chloride: 107 mmol/L (ref 98–111)
Creatinine, Ser: 0.73 mg/dL (ref 0.44–1.00)
GFR calc Af Amer: >60 mL/min (ref >60)
GFR calc non Af Amer: >60 mL/min (ref >60)
Glucose, Bld: 94 mg/dL (ref 70–99)
Potassium: 3.6 mmol/L (ref 3.5–5.1)
Sodium: 137 mmol/L (ref 135–145)

## 2019-07-14 LAB — CBC
HCT: 32.7 % — ABNORMAL LOW (ref 36.0–46.0)
Hemoglobin: 9.5 g/dL — ABNORMAL LOW (ref 12.0–15.0)
MCH: 22.1 pg — ABNORMAL LOW (ref 26.0–34.0)
MCHC: 29.1 g/dL — ABNORMAL LOW (ref 30.0–36.0)
MCV: 76 fL — ABNORMAL LOW (ref 80.0–100.0)
Platelets: 434 10*3/uL — ABNORMAL HIGH (ref 150–400)
RBC: 4.3 MIL/uL (ref 3.87–5.11)
RDW: 16 % — ABNORMAL HIGH (ref 11.5–15.5)
WBC: 10.5 10*3/uL (ref 4.0–10.5)
nRBC: 0 % (ref 0.0–0.2)

## 2019-07-14 LAB — I-STAT BETA HCG BLOOD, ED (MC, WL, AP ONLY): I-stat hCG, quantitative: <5 m[IU]/mL (ref <5)

## 2019-07-14 LAB — D-DIMER, QUANTITATIVE: D-Dimer, Quant: 0.8 ug/mL-FEU — ABNORMAL HIGH (ref 0.00–0.50)

## 2019-07-14 NOTE — ED Provider Notes (Signed)
[facility]-EMERGENCY DEPT Provider Note   CSN: [id number] Arrival date & time: 07/14/19  2058     History Chief Complaint  Patient presents with  . Dizziness    Brianna Byrd is a 24 y.o. female.  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 Symptoms lasted for some time, possibly hours, but by the time my evaluation have completely resolved, and she has no ongoing complaints beyond mild headache. She notes that she has been having headaches for about 3 weeks, without changes in the characteristics. She has primary care visit scheduled in 2 days for evaluation of her headache.  Patient has a notable history of Covid earlier in the year, with prolonged recovery.  She does, however, note that she was well prior to today's episode. Currently she denies any pain, palpitations, lightheadedness, dyspnea, fever.  Past Medical History:  Diagnosis Date  . Asthma   . Obesity   . Reflux     Patient Active Problem List   Diagnosis Date Noted  . Migraine without aura and without status migrainosus, not intractable 03/04/2019  . Left breast mass 01/27/2019  . History of pre-eclampsia 02/12/2017  . SVD (spontaneous vaginal delivery) 12/26/2016    Past Surgical History:  Procedure Laterality Date  . NO PAST SURGERIES       OB History    Gravida  1   Para  1   Term  1   Preterm      AB      Living  1     SAB      TAB      Ectopic      Multiple  0   Live Births  1           Family History  Problem Relation Age of Onset  . Diabetes Father   . Hypertension Mother   . Diabetes Sister   . Cancer Neg Hx     Social History   Tobacco Use  . Smoking status: Never Smoker  . Smokeless tobacco: Never Used  Substance Use Topics  . Alcohol use: No  .  Drug use: No    Home Medications Prior to Admission medications   Medication Sig Start Date End Date Taking? Authorizing Provider  ferrous gluconate (FERGON) 324 MG tablet Take 1 tablet (324 mg total) by mouth 2 (two) times daily with a meal. 03/27/19   Levie Heritage, DO  ferrous sulfate 325 (65 FE) MG tablet Take 1 tablet (325 mg total) by mouth daily. 06/18/19   Petrucelli, Samantha R, PA-C  hydrocortisone 2.5 % cream Apply topically 2 (two) times daily. 05/22/19   Geoffery Lyons, MD  methocarbamol (ROBAXIN) 500 MG tablet Take 1 tablet (500 mg total) by mouth every 8 (eight) hours as needed. 06/18/19   Petrucelli, Samantha R, PA-C  naproxen (NAPROSYN) 500 MG tablet Take 1 tablet (500 mg total) by mouth 2 (two) times daily. 06/18/19   Petrucelli, Samantha R, PA-C  norgestimate-ethinyl estradiol (ORTHO-CYCLEN) 0.25-35 MG-MCG tablet Take 1 tablet by mouth daily. 3 tabs daily for 3 days, then 2 tabs daily for 3 days, then one tab daily. Skip the last week of pills and start a new pack. 05/22/19   Levie Heritage, DO  fluticasone (FLONASE) 50 MCG/ACT nasal spray Place 2 sprays into both nostrils  daily. Patient not taking: Reported on 01/27/2019 06/05/18 05/01/19  Palumbo, April, MD  medroxyPROGESTERone (PROVERA) 10 MG tablet Take 1 tablet (10 mg total) by mouth daily for 7 days. 03/04/19 05/01/19  Shelda Pal, DO  norethindrone (ORTHO MICRONOR) 0.35 MG tablet Take 1 tablet (0.35 mg total) by mouth daily. 03/27/19 05/22/19  Truett Mainland, DO  SUMAtriptan (IMITREX) 100 MG tablet Take 1 tablet (100 mg total) by mouth every 2 (two) hours as needed for migraine. May repeat in 2 hours if headache persists or recurs. Patient not taking: Reported on 03/27/2019 03/04/19 05/01/19  Shelda Pal, DO    Allergies    Peach [prunus persica]  Review of Systems   Review of Systems  Constitutional:       Per HPI, otherwise negative  HENT:       Per HPI, otherwise negative  Respiratory:       Per HPI,  otherwise negative  Cardiovascular:       Per HPI, otherwise negative  Gastrointestinal: Negative for vomiting.  Endocrine:       Negative aside from HPI  Genitourinary:       Neg aside from HPI   Musculoskeletal:       Per HPI, otherwise negative  Skin: Negative.   Neurological: Positive for light-headedness and headaches. Negative for syncope.    Physical Exam Updated Vital Signs BP 110/79   Pulse 94   Temp 98.3 F (36.8 C) (Oral)   Resp 14   SpO2 100%   Physical Exam Vitals and nursing note reviewed.  Constitutional:      General: She is not in acute distress.    Appearance: She is well-developed.  HENT:     Head: Normocephalic and atraumatic.  Eyes:     Conjunctiva/sclera: Conjunctivae normal.  Cardiovascular:     Rate and Rhythm: Normal rate and regular rhythm.  Pulmonary:     Effort: Pulmonary effort is normal. No respiratory distress.     Breath sounds: Normal breath sounds. No stridor.  Abdominal:     General: There is no distension.  Skin:    General: Skin is warm and dry.  Neurological:     General: No focal deficit present.     Mental Status: She is alert and oriented to person, place, and time.     Cranial Nerves: No cranial nerve deficit.     Motor: No weakness, tremor, atrophy or abnormal muscle tone.     ED Results / Procedures / Treatments   Labs (all labs ordered are listed, but only abnormal results are displayed) Labs Reviewed  BASIC METABOLIC PANEL - Abnormal; Notable for the following components:      Result Value   Calcium 8.6 (*)    All other components within normal limits  CBC - Abnormal; Notable for the following components:   Hemoglobin 9.5 (*)    HCT 32.7 (*)    MCV 76.0 (*)    MCH 22.1 (*)    MCHC 29.1 (*)    RDW 16.0 (*)    Platelets 434 (*)    All other components within normal limits  D-DIMER, QUANTITATIVE (NOT AT [facility]) - Abnormal; Notable for the following components:   D-Dimer, Quant 0.80 (*)    All other components  within normal limits  I-STAT BETA HCG BLOOD, ED ([facility], [facility], [facility] ONLY)    EKG EKG Interpretation  Date/Time:  Tuesday July 14 2019 21:04:32 EDT Ventricular Rate:  122 PR Interval:  QRS Duration: 91 QT Interval:  320 QTC Calculation: 456 R Axis:   75 Text Interpretation: Sinus tachycardia with irregular rate Borderline Q waves in inferior leads 12 Lead; Mason-Likar Abnormal ECG Confirmed by Gerhard Munch [phone]) on 07/14/2019 10:54:18 PM    ED Course  I have reviewed the triage vital signs and the nursing notes.  Pertinent labs & imaging results that were available during my care of the patient were reviewed by me and considered in my medical decision making (see chart for details).  Young female presents after episode of palpitations, lightheadedness. Patient is recovering from Covid which occurred earlier in the year, MMI evaluation has no complaints including chest pain, dyspnea, palpitations. Patient has reassuring labs.  She does have a D-dimer that is 0.8, though this is diminished from 3 weeks ago, and if there were more suspicion for PE, this would likely be increasing rather than decreasing. In addition, patient has not hypoxic, has no ongoing chest pain, head as outpatient schedule follow-up in 2 days.  Patient amenable to, appropriate for close outpatient follow-up. Final Clinical Impression(s) / ED Diagnoses Final diagnoses:  Palpitations     Gerhard Munch, MD 07/14/19 2255

## 2019-07-14 NOTE — ED Triage Notes (Signed)
Pt c/o headache, dizziness today, took her heart rate and is was in 130's. Denies fevers.

## 2019-07-14 NOTE — Discharge Instructions (Addendum)
As discussed, your evaluation today has been largely reassuring.  But, it is important that you monitor your condition carefully, and do not hesitate to return to the ED if you develop new, or concerning changes in your condition. ? ?Otherwise, please follow-up with your physician for appropriate ongoing care. ? ?

## 2019-07-16 ENCOUNTER — Ambulatory Visit: Payer: Medicaid Other | Admitting: Physician Assistant

## 2019-07-16 ENCOUNTER — Ambulatory Visit: Payer: Medicaid Other | Admitting: Family Medicine

## 2019-07-30 ENCOUNTER — Other Ambulatory Visit: Payer: Self-pay

## 2019-07-30 ENCOUNTER — Encounter: Payer: Self-pay | Admitting: Nurse Practitioner

## 2019-07-30 ENCOUNTER — Ambulatory Visit (INDEPENDENT_AMBULATORY_CARE_PROVIDER_SITE_OTHER): Payer: Self-pay | Admitting: Nurse Practitioner

## 2019-07-30 VITALS — BP 110/72 | HR 62 | Temp 98.7°F | Ht 62.0 in | Wt 211.0 lb

## 2019-07-30 DIAGNOSIS — R12 Heartburn: Secondary | ICD-10-CM | POA: Insufficient documentation

## 2019-07-30 DIAGNOSIS — R142 Eructation: Secondary | ICD-10-CM

## 2019-07-30 DIAGNOSIS — R1013 Epigastric pain: Secondary | ICD-10-CM

## 2019-07-30 DIAGNOSIS — D509 Iron deficiency anemia, unspecified: Secondary | ICD-10-CM

## 2019-07-30 DIAGNOSIS — Z8742 Personal history of other diseases of the female genital tract: Secondary | ICD-10-CM

## 2019-07-30 DIAGNOSIS — K5900 Constipation, unspecified: Secondary | ICD-10-CM

## 2019-07-30 DIAGNOSIS — R140 Abdominal distension (gaseous): Secondary | ICD-10-CM | POA: Insufficient documentation

## 2019-07-30 NOTE — Progress Notes (Signed)
07/30/2019 Brianna Byrd [id number] [birth year]   CHIEF COMPLAINT: Burping  and upper abdominal tightness  HISTORY OF PRESENT ILLNESS:  Brianna Byrd is a 24 year old female with a past medical history of obesity, asthma, migraine headaches, anemia secondary to menorrhagia, infantile reflux which required "stretching of my esophagus" and Covid 19 04/14/2019.  She presents today for further evaluation regarding upper abdominal pain and tightness which radiates into her mid chest which started approximately 2 months ago.  Her abdominal and chest discomfort is relieved after she burps.  She stopped drinking carbonated sodas but her symptoms did not improve.  She does not chew gum.  Drinking water will result in excessive burping for up to 5 hours.  Her upper abdominal tightness worsened and she vomited partially undigested food x 1 on Sunday 5/9.  No hematemesis.  No further vomiting since that time.  She reports having heartburn for the past 5 years which is typically triggered by eating fatty or fried foods.  She reports having heartburn approximately 7 out of every 14 days.  No ysphagia.  She recently took her mother's reflux medication for 3 days without improvement.  She has tried Pepcid 20 mg once daily and Tums without significant improvement.  She is eating a bland limited diet.  No weight loss.  She is passing a normal brown bowel every 2 to 3 days.  Her stool is somewhat darker in color when she takes ferrous sulfate.  If she skips a dose of ferrous sulfate her stool is normal brown in color.  No black-colored stools.  No rectal bleeding.  She has a significant history of menorrhagia since 12/2018 followed closely by her gynecologist.  She reported having daily menstrual bleeding for 6 months which abated 5 days ago while on oral birth control pills and ferrous sulfate.  She was previously taking p.o. iron without significant improvement in her hemoglobin levels, therefore she was  switched to ferrous sulfate a few weeks ago.  Her most recent hemoglobin was 9.5 on 07/14/2019.  She denies taking NSAIDs on a regular basis.  She presented to [facility] ED on 07/14/2019 with complaints of palpitations and feeling lightheaded.  Laboratory studies showed an elevated D-dimer at 0.8 which has diminished of from level 2 to 3 weeks ago.  She was previously diagnosed with Covid 19 03/2019 which can increase the D-dimer level.  Her labs were normal. She was discharged home. No further palpitations are lightheadedness.  She was seen at [facility] ED on 06/18/2019 with right sided chest pain.  A twelve-lead EKG showed tachycardia with a heart rate of 105 -120.  D-dimer level was elevated. She underwent a chest CT angiogram 06/18/2019 which was negative.  Tachycardia resolved.  Her chest pain was assessed to be musculoskeletal and she was discharged home on Naproxen and Robaxin.  She presented to [place] ER on 05/31/2019 with middle back pain.  She was prescribed Ibuprofen 8 mg 3 times daily and Robaxin.  She was discharged home.  She presented to [place] emergency room 05/22/2019 with complaints of epigastric pain radiated to the left flank area after eating pizza.  Symptoms improved after she received a GI cocktail.  A upper quadrant abdominal sonogram was normal.  No evidence of gallstones or cholecystitis.   CBC Latest Ref Rng & Units 07/14/2019 06/18/2019 05/24/2019  WBC 4.0 - 10.5 K/uL 10.5 7.1 -  Hemoglobin 12.0 - 15.0 g/dL 9.5(L) 9.3(L) 11.6(L)  Hematocrit  36.0 - 46.0 % 32.7(L) 31.6(L) 34.0(L)  Platelets 150 - 400 K/uL 434(H) 469(H) -    CMP Latest Ref Rng & Units 07/14/2019 06/18/2019 05/24/2019  Glucose 70 - 99 mg/dL 94 96 87  BUN 6 - 20 mg/dL 10 5(L) 7  Creatinine 1.61 - 1.00 mg/dL 0.96 0.45 4.09  Sodium 135 - 145 mmol/L 137 138 139  Potassium 3.5 - 5.1 mmol/L 3.6 3.7 4.0  Chloride 98 - 111 mmol/L 107 106 105  CO2 22 - 32 mmol/L 24 24 -  Calcium 8.9 - 10.3  mg/dL 8.1(X) 9.1(Y) -  Total Protein 6.5 - 8.1 g/dL - - -  Total Bilirubin 0.3 - 1.2 mg/dL - - -  Alkaline Phos 38 - 126 U/L - - -  AST 15 - 41 U/L - - -  ALT 0 - 44 U/L - - -   Abdominal sonogram 05/23/2019: 1. No acute findings. Specifically, no gallstones or findings of acute cholecystitis.  Past Medical History:  Diagnosis Date  . Asthma   . Obesity   . Reflux    Past Surgical History:  Procedure Laterality Date  . NO PAST SURGERIES     Family history: Mother age 82 with thyroid disease and HTN. Father 72 healthy. Sister 66 with DM. Sister age 59 with CHF etiology unknown.  No family history of esophageal, gastric or colon cancer.  Social History: She is a Scientist, clinical (histocompatibility and immunogenetics) at [employer]. She has a daughter age 6. No smoking history. No alcohol or drug use.   Allergies  Allergen Reactions  . Peach [Prunus Persica] Swelling      Outpatient Encounter Medications as of 07/30/2019  Medication Sig  . ferrous gluconate (FERGON) 324 MG tablet Take 1 tablet (324 mg total) by mouth 2 (two) times daily with a meal.  . ferrous sulfate 325 (65 FE) MG tablet Take 1 tablet (325 mg total) by mouth daily.  . hydrocortisone 2.5 % cream Apply topically 2 (two) times daily.  . methocarbamol (ROBAXIN) 500 MG tablet Take 1 tablet (500 mg total) by mouth every 8 (eight) hours as needed.  . naproxen (NAPROSYN) 500 MG tablet Take 1 tablet (500 mg total) by mouth 2 (two) times daily.  . norgestimate-ethinyl estradiol (ORTHO-CYCLEN) 0.25-35 MG-MCG tablet Take 1 tablet by mouth daily. 3 tabs daily for 3 days, then 2 tabs daily for 3 days, then one tab daily. Skip the last week of pills and start a new pack.  . [DISCONTINUED] fluticasone (FLONASE) 50 MCG/ACT nasal spray Place 2 sprays into both nostrils daily. (Patient not taking: Reported on 01/27/2019)  . [DISCONTINUED] medroxyPROGESTERone (PROVERA) 10 MG tablet Take 1 tablet (10 mg total) by mouth daily for 7 days.  . [DISCONTINUED] norethindrone (ORTHO MICRONOR)  0.35 MG tablet Take 1 tablet (0.35 mg total) by mouth daily.  . [DISCONTINUED] SUMAtriptan (IMITREX) 100 MG tablet Take 1 tablet (100 mg total) by mouth every 2 (two) hours as needed for migraine. May repeat in 2 hours if headache persists or recurs. (Patient not taking: Reported on 03/27/2019)   No facility-administered encounter medications on file as of 07/30/2019.     REVIEW OF SYSTEMS: All other systems reviewed and negative except where noted in the History of Present Illness.   PHYSICAL EXAM: BP 110/72   Pulse 62   Temp 98.7 F (37.1 C)   Ht 5\' 2"  (1.575 m)   Wt 211 lb (95.7 kg)   BMI 38.59 kg/m  General: Well developed 24 year old female in  no acute distress. Head: Normocephalic and atraumatic. Eyes:  Sclerae non-icteric, conjunctive pink. Ears: Normal auditory acuity. Mouth: Dentition intact. No ulcers or lesions.  Neck: Supple, no lymphadenopathy or thyromegaly.  Lungs: Clear bilaterally to auscultation without wheezes, crackles or rhonchi. Heart: Regular rate and rhythm. No murmur, rub or gallop appreciated.  Abdomen: Soft, non distended.  Mild epigastric tenderness. No masses. No hepatosplenomegaly. Normoactive bowel sounds x 4 quadrants.  Rectal: Deferred. Musculoskeletal: Symmetrical with no gross deformities. Skin: Warm and dry. No rash or lesions on visible extremities. Extremities: No edema. Neurological: Alert oriented x 4, no focal deficits.  Psychological:  Alert and cooperative. Normal mood and affect.  ASSESSMENT AND PLAN:  7.  24 year old female with upper abdominal bloat discomfort which radiates to the mid chest and heartburn. -Pepcid 20 mg 1 p.o. twice daily (she developed worsening headaches on PPI) -Gas-X 1 tab p.o. twice daily -MiraLAX 1 capful mixed in 8 ounces water to increase stool output -FODMAP handout -EGD benefits and risks discussed including risk with sedation, risk of bleeding, perforation and infection  -If the above evaluation negative  to consider [facility] HIDA scan  2.  IDA secondary to menorrhagia  3.  COVID-19 infection 03/2019        CC:  No ref. provider found

## 2019-07-30 NOTE — Patient Instructions (Signed)
If you are age 24 or older, your body mass index should be between 23-30. Your Body mass index is 38.59 kg/m. If this is out of the aforementioned range listed, please consider follow up with your Primary Care Provider.  If you are age 60 or younger, your body mass index should be between 19-25. Your Body mass index is 38.59 kg/m. If this is out of the aformentioned range listed, please consider follow up with your Primary Care Provider.   Continue taking your Pepcid (famotidine) 20 mg twice a day Use Gas X 1 twice daily Take Miralax 1 capful mixed in 8 ounces of water at bed time for constipation as tolerated.  Follow the enclosed FODMAP diet.  Due to recent changes in healthcare laws, you may see the results of your imaging and laboratory studies on MyChart before your provider has had a chance to review them.  We understand that in some cases there may be results that are confusing or concerning to you. Not all laboratory results come back in the same time frame and the provider may be waiting for multiple results in order to interpret others.  Please give [place] 48 hours in order for your provider to thoroughly review all the results before contacting the office for clarification of your results.   Call if your symptoms worsen [phone].  Thank you for choosing [facility] Arnaldo Natal, CRNP

## 2019-08-02 NOTE — Progress Notes (Signed)
Attending Physician's Attestation   I have reviewed the chart.   I agree with the Advanced Practitioner's note, impression, and recommendations with any updates as below.    Elberta Lachapelle Mansouraty, MD [facility] Office # [phone]  

## 2019-08-06 ENCOUNTER — Encounter: Payer: Self-pay | Admitting: Family Medicine

## 2019-08-06 ENCOUNTER — Other Ambulatory Visit: Payer: Self-pay

## 2019-08-06 ENCOUNTER — Ambulatory Visit (INDEPENDENT_AMBULATORY_CARE_PROVIDER_SITE_OTHER): Payer: Medicaid Other | Admitting: Family Medicine

## 2019-08-06 VITALS — BP 101/56 | HR 88 | Wt 209.1 lb

## 2019-08-06 DIAGNOSIS — N939 Abnormal uterine and vaginal bleeding, unspecified: Secondary | ICD-10-CM | POA: Diagnosis not present

## 2019-08-06 DIAGNOSIS — Z308 Encounter for other contraceptive management: Secondary | ICD-10-CM

## 2019-08-06 MED ORDER — NORGESTIMATE-ETH ESTRADIOL 0.25-35 MG-MCG PO TABS: 1.0000 | ORAL_TABLET | Freq: Every day | ORAL | 3 refills | Status: DC

## 2019-08-06 NOTE — Progress Notes (Signed)
   Subjective:    Patient ID: Brianna Byrd, female    DOB: [birth year], 24 y.o.   MRN: [id number]  HPI Patient seen for f/u of AUB. She was started on micronor in 03/2019. [place] in 05/2019 that showed an thickened endometrium, but no fibroids. She was transitioned to COC with taper. After taper, bleeding had stopped, but when she went to pick up her birth control, was given micronor again. Currently having spotting almost every day.   Review of Systems     Objective:   Physical Exam Vitals reviewed.  Constitutional:      Appearance: Normal appearance.  Cardiovascular:     Rate and Rhythm: Normal rate.     Pulses: Normal pulses.     Heart sounds: Normal heart sounds.  Pulmonary:     Effort: Pulmonary effort is normal.     Breath sounds: Normal breath sounds.  Skin:    Capillary Refill: Capillary refill takes less than 2 seconds.  Neurological:     Mental Status: She is alert.  Psychiatric:        Mood and Affect: Mood normal.        Behavior: Behavior normal.        Thought Content: Thought content normal.        Judgment: Judgment normal.        Assessment & Plan:  1. Abnormal uterine bleeding (AUB) Restart Sprintec. Stop micronor. Will do continuous COC. If this isn't controlling bleeding, then will add additional progesterone.

## 2019-08-26 ENCOUNTER — Ambulatory Visit (INDEPENDENT_AMBULATORY_CARE_PROVIDER_SITE_OTHER): Payer: Self-pay

## 2019-08-26 ENCOUNTER — Other Ambulatory Visit: Payer: Self-pay | Admitting: Gastroenterology

## 2019-08-26 DIAGNOSIS — Z1159 Encounter for screening for other viral diseases: Secondary | ICD-10-CM

## 2019-08-27 LAB — SARS CORONAVIRUS 2 (TAT 6-24 HRS): SARS Coronavirus 2: NEGATIVE

## 2019-08-28 ENCOUNTER — Telehealth: Payer: Self-pay | Admitting: Gastroenterology

## 2019-08-28 ENCOUNTER — Ambulatory Visit (AMBULATORY_SURGERY_CENTER): Payer: Self-pay | Admitting: Gastroenterology

## 2019-08-28 ENCOUNTER — Other Ambulatory Visit: Payer: Self-pay

## 2019-08-28 ENCOUNTER — Encounter: Payer: Self-pay | Admitting: Gastroenterology

## 2019-08-28 VITALS — BP 111/69 | HR 84 | Temp 97.1°F | Resp 19 | Ht 62.0 in | Wt 211.0 lb

## 2019-08-28 DIAGNOSIS — K2970 Gastritis, unspecified, without bleeding: Secondary | ICD-10-CM

## 2019-08-28 DIAGNOSIS — K228 Other specified diseases of esophagus: Secondary | ICD-10-CM

## 2019-08-28 DIAGNOSIS — R1013 Epigastric pain: Secondary | ICD-10-CM

## 2019-08-28 DIAGNOSIS — K219 Gastro-esophageal reflux disease without esophagitis: Secondary | ICD-10-CM

## 2019-08-28 DIAGNOSIS — K2950 Unspecified chronic gastritis without bleeding: Secondary | ICD-10-CM

## 2019-08-28 HISTORY — PX: UPPER GASTROINTESTINAL ENDOSCOPY: SHX188

## 2019-08-28 MED ORDER — ESOMEPRAZOLE MAGNESIUM 40 MG PO CPDR
40.0000 mg | DELAYED_RELEASE_CAPSULE | Freq: Every day | ORAL | 2 refills | Status: DC
Start: 2019-08-28 — End: 2020-05-18

## 2019-08-28 MED ORDER — SODIUM CHLORIDE 0.9 % IV SOLN: 500.0000 mL | Freq: Once | INTRAVENOUS | Status: DC

## 2019-08-28 NOTE — Telephone Encounter (Signed)
I have called the [employer] help line to start the prior authorization and they have hung up on me four times. Perhaps something is wrong with their phone. I will try again Monday.

## 2019-08-28 NOTE — Telephone Encounter (Signed)
Tried to call Mom back and her voice mail has not been set up yet the recording says. I was able to leave a message on [employer]'s # that yes the Nexium does require a prior authorization that we will work on as soon as we can.

## 2019-08-28 NOTE — Telephone Encounter (Signed)
Pt's mother Brianna Byrd called to inform that pharmacy told her that Nexium needs some approval from MD, she was not sure. I thinks that she was probably referring to PA. She is requesting if we can find out because without that medication is not affordable for pt.

## 2019-08-28 NOTE — Progress Notes (Signed)
Pt's states no medical or surgical changes since previsit or office visit.  Vitals DT 

## 2019-08-28 NOTE — Progress Notes (Signed)
Called to room to assist during endoscopic procedure.  Patient ID and intended procedure confirmed with present staff. Received instructions for my participation in the procedure from the performing physician.  

## 2019-08-28 NOTE — Op Note (Signed)
[place] Patient Name: Brianna Byrd Procedure Date: 08/28/2019 9:07 AM MRN: [id number] Endoscopist: Justice Britain , MD Age: 24 Referring MD:  Date of Birth: July 08, 1995 Gender: Female Account #: [id number] Procedure:                Upper GI endoscopy Indications:              Epigastric abdominal pain, Suspected                            gastro-esophageal reflux disease, Abdominal                            bloating, Chest pain (non cardiac), Nausea with                            vomiting Medicines:                Monitored Anesthesia Care Procedure:                Pre-Anesthesia Assessment:                           - Prior to the procedure, a History and Physical                            was performed, and patient medications and                            allergies were reviewed. The patient's tolerance of                            previous anesthesia was also reviewed. The risks                            and benefits of the procedure and the sedation                            options and risks were discussed with the patient.                            All questions were answered, and informed consent                            was obtained. Prior Anticoagulants: The patient has                            taken no previous anticoagulant or antiplatelet                            agents. ASA Grade Assessment: II - A patient with                            mild systemic disease. After reviewing the risks  and benefits, the patient was deemed in                            satisfactory condition to undergo the procedure.                           After obtaining informed consent, the endoscope was                            passed under direct vision. Throughout the                            procedure, the patient's blood pressure, pulse, and                            oxygen saturations were monitored continuously. The                             Endoscope was introduced through the mouth, and                            advanced to the second part of duodenum. The upper                            GI endoscopy was accomplished without difficulty.                            The patient tolerated the procedure. Scope In: Scope Out: Findings:                 No gross lesions were noted in the entire                            esophagus. Biopsies were taken with a cold forceps                            for histology.                           The Z-line was regular and was found 38 cm from the                            incisors.                           A J-shaped deformity was found of the entire                            examined stomach.                           Patchy mildly erythematous mucosa without bleeding                            was found in the gastric fundus and in the gastric  antrum.                           No other gross lesions were noted in the entire                            examined stomach. Biopsies were taken with a cold                            forceps for histology and Helicobacter pylori                            testing.                           No gross lesions were noted in the duodenal bulb,                            in the first portion of the duodenum and in the                            second portion of the duodenum. Biopsies were taken                            with a cold forceps for histology. Complications:            No immediate complications. Estimated Blood Loss:     Estimated blood loss was minimal. Impression:               - No gross lesions in esophagus. Biopsied.                           - Z-line regular, 38 cm from the incisors.                           - J-shaped deformity of the entire stomach.                            Erythematous mucosa in the gastric fundus and                            antrum. No other gross  lesions in the stomach.                            Biopsied.                           - No gross lesions in the duodenal bulb, in the                            first portion of the duodenum and in the second                            portion of the duodenum. Biopsied. Recommendation:           -  The patient will be observed post-procedure,                            until all discharge criteria are met.                           - Discharge patient to home.                           - Patient has a contact number available for                            emergencies. The signs and symptoms of potential                            delayed complications were discussed with the                            patient. Return to normal activities tomorrow.                            Written discharge instructions were provided to the                            patient.                           - Resume previous diet.                           - Start Nexium 40 mg daily.                           - Await pathology results.                           - Observe patient's clinical course.                           - If Nausea/Vomiting persists then would consider a                            full abdominal ultrasound and potential need for                            CT-Abdomen/Pelvis. SF-GES may be necessary in the                            future as well.                           - For chest discomfort query role of Manometry, but                            would discuss further in clinic first.                           -  Follow up in clinic with NP [facility] or                            myself in 4-6 weeks.                           - The findings and recommendations were discussed                            with the patient. Justice Britain, MD 08/28/2019 9:37:43 AM

## 2019-08-28 NOTE — Patient Instructions (Signed)
HANDOUTS PROVIDED ON: GASTRITIS  The biopsies taken today have been sent for pathology.  The results can take 1-3 weeks to receive.    You may resume your previous diet and medication schedule.  A prescription for Nexium 40 mg daily before breakfast has been sent to your pharmacy.  Thank you for allowing [place] to care for you today!!!   YOU HAD AN ENDOSCOPIC PROCEDURE TODAY AT THE [facility]:   Refer to the procedure report that was given to you for any specific questions about what was found during the examination.  If the procedure report does not answer your questions, please call your gastroenterologist to clarify.  If you requested that your care partner not be given the details of your procedure findings, then the procedure report has been included in a sealed envelope for you to review at your convenience later.  YOU SHOULD EXPECT: Some feelings of bloating in the abdomen. Passage of more gas than usual.  Walking can help get rid of the air that was put into your GI tract during the procedure and reduce the bloating.  Please Note:  You might notice some irritation and congestion in your nose or some drainage.  This is from the oxygen used during your procedure.  There is no need for concern and it should clear up in a day or so.  SYMPTOMS TO REPORT IMMEDIATELY:   Following upper endoscopy (EGD)  Vomiting of blood or coffee ground material  New chest pain or pain under the shoulder blades  Painful or persistently difficult swallowing  New shortness of breath  Fever of 100F or higher  Black, tarry-looking stools  For urgent or emergent issues, a gastroenterologist can be reached at any hour by calling [phone]. Do not use MyChart messaging for urgent concerns.    DIET:  We do recommend a small meal at first, but then you may proceed to your regular diet.  Drink plenty of fluids but you should avoid alcoholic beverages for 24 hours.  ACTIVITY:  You should plan  to take it easy for the rest of today and you should NOT DRIVE or use heavy machinery until tomorrow (because of the sedation medicines used during the test).    FOLLOW UP: Our staff will call the number listed on your records 48-72 hours following your procedure to check on you and address any questions or concerns that you may have regarding the information given to you following your procedure. If we do not reach you, we will leave a message.  We will attempt to reach you two times.  During this call, we will ask if you have developed any symptoms of COVID 19. If you develop any symptoms (ie: fever, flu-like symptoms, shortness of breath, cough etc.) before then, please call [phone].  If you test positive for Covid 19 in the 2 weeks post procedure, please call and report this information to [place].    If any biopsies were taken you will be contacted by phone or by letter within the next 1-3 weeks.  Please call us at [phone] if you have not heard about the biopsies in 3 weeks.    SIGNATURES/CONFIDENTIALITY: You and/or your care partner have signed paperwork which will be entered into your electronic medical record.  These signatures attest to the fact that that the information above on your After Visit Summary has been reviewed and is understood.  Full responsibility of the confidentiality of this discharge information lies with you and/or  your care-partner. 

## 2019-08-28 NOTE — Progress Notes (Signed)
Brianna Elie, CRNA Propofol given at beginning and throughout procedure to maintain safe and appropriate comfort level. VO/MDTamela Gammon, CRNA

## 2019-08-28 NOTE — Progress Notes (Signed)
PT taken to PACU. Monitors in place. VSS. Report given to RN. 

## 2019-08-31 NOTE — Telephone Encounter (Signed)
I noticed this AM that her primary insurance is thru [employer]. I called the pharmacy and turns out she has already picked up the medicine with a coupon card.

## 2019-09-01 ENCOUNTER — Encounter: Payer: Self-pay | Admitting: Gastroenterology

## 2019-09-01 ENCOUNTER — Telehealth: Payer: Self-pay | Admitting: Gastroenterology

## 2019-09-01 ENCOUNTER — Telehealth: Payer: Self-pay

## 2019-09-01 NOTE — Telephone Encounter (Signed)
Letter has been written and sent to My Chart.  I left a message for the pt advising her to call if she needs anything further on voice mail.

## 2019-09-01 NOTE — Telephone Encounter (Signed)
°  Follow up Call-  Call back number 08/28/2019  Post procedure Call Back phone  # [phone]  Permission to leave phone message Yes  Some recent data might be hidden     Patient questions:  Do you have a fever, pain , or abdominal swelling? No. Pain Score  0 *  Have you tolerated food without any problems? Yes.    Have you been able to return to your normal activities? Yes.    Do you have any questions about your discharge instructions: Diet   No. Medications  No. Follow up visit  No.  Do you have questions or concerns about your Care? Yes.     Patient asked about follow-up appointment. Told her that under the recommendations on her report that it says to have a follow up in the clinic in 4-6 weeks.  Actions: * If pain score is 4 or above: No action needed, pain <4.    1. Have you developed a fever since your procedure? no  2.   Have you had an respiratory symptoms (SOB or cough) since your procedure? no  3.   Have you tested positive for COVID 19 since your procedure no  4.   Have you had any family members/close contacts diagnosed with the COVID 19 since your procedure?  no   If yes to any of these questions please route to Laverna Peace, RN and Charlett Lango, RN

## 2019-09-01 NOTE — Telephone Encounter (Signed)
Pt had an EGD 08/28/19 and requested a doctor's note.

## 2019-09-03 ENCOUNTER — Emergency Department (HOSPITAL_BASED_OUTPATIENT_CLINIC_OR_DEPARTMENT_OTHER)
Admission: EM | Admit: 2019-09-03 | Discharge: 2019-09-04 | Disposition: A | Discharge status: Home / Self Care | Payer: Medicaid Other | Attending: Emergency Medicine | Admitting: Emergency Medicine

## 2019-09-03 ENCOUNTER — Other Ambulatory Visit: Payer: Self-pay

## 2019-09-03 ENCOUNTER — Encounter (HOSPITAL_BASED_OUTPATIENT_CLINIC_OR_DEPARTMENT_OTHER): Payer: Self-pay | Admitting: *Deleted

## 2019-09-03 DIAGNOSIS — J45909 Unspecified asthma, uncomplicated: Secondary | ICD-10-CM | POA: Insufficient documentation

## 2019-09-03 DIAGNOSIS — Z79899 Other long term (current) drug therapy: Secondary | ICD-10-CM | POA: Insufficient documentation

## 2019-09-03 DIAGNOSIS — Z792 Long term (current) use of antibiotics: Secondary | ICD-10-CM | POA: Insufficient documentation

## 2019-09-03 DIAGNOSIS — L731 Pseudofolliculitis barbae: Secondary | ICD-10-CM | POA: Insufficient documentation

## 2019-09-03 NOTE — ED Triage Notes (Signed)
co mutiple " lumps " to right side of face x 2 weeks. painful x 2 days

## 2019-09-04 MED ORDER — CLINDAMYCIN HCL 150 MG PO CAPS
300.0000 mg | ORAL_CAPSULE | Freq: Three times a day (TID) | ORAL | 0 refills | Status: AC
Start: 2019-09-04 — End: 2019-09-09

## 2019-09-04 NOTE — ED Provider Notes (Signed)
[facility] EMERGENCY DEPARTMENT Provider Note  CSN: [id number] Arrival date & time: 09/03/19 2346  Chief Complaint(s) Rash  HPI Brianna Byrd is a 24 y.o. female   HPI Who presents with 2 weeks of painful bump on her right temple which has worsened since onset.  Worse with palpation.  No alleviating factors. Patient has developed a painful nodules to the right periauricular nodes. She denies any fevers or chills.  No coughing or congestion.  No nausea or vomiting.  No otalgia.  No trauma.   Past Medical History Past Medical History:  Diagnosis Date  . Asthma   . Obesity   . Reflux    Patient Active Problem List   Diagnosis Date Noted  . Abdominal bloating 07/30/2019  . Heartburn 07/30/2019  . Migraine without aura and without status migrainosus, not intractable 03/04/2019  . Left breast mass 01/27/2019  . History of pre-eclampsia 02/12/2017  . SVD (spontaneous vaginal delivery) 12/26/2016   Home Medication(s) Prior to Admission medications   Medication Sig Start Date End Date Taking? Authorizing Provider  Cholecalciferol 1.25 MG (50000 UT) capsule Frequency:weekly   Dosage:50000   UNIT  Instructions:D3-50 50000UNIT, 1 (one) Capsule Capsule weekly  Note: 06/01/13   [provider]  clindamycin (CLEOCIN) 150 MG capsule Take 2 capsules (300 mg total) by mouth 3 (three) times daily for 5 days. 09/04/19 09/09/19  Nira Conn, MD  esomeprazole (NEXIUM) 40 MG capsule Take 1 capsule (40 mg total) by mouth daily before breakfast. 08/28/19   Mansouraty, Netty Starring., MD  ferrous sulfate 325 (65 FE) MG tablet Take 1 tablet (325 mg total) by mouth daily. Patient not taking: Reported on 08/28/2019 06/18/19   Petrucelli, Pleas Koch, PA-C  ibuprofen (ADVIL) 800 MG tablet Take 800 mg by mouth 3 (three) times daily. 06/28/19   [provider]  methocarbamol (ROBAXIN) 500 MG tablet Take 1 tablet (500 mg total) by mouth every 8 (eight) hours as needed.  06/18/19   Petrucelli, Samantha R, PA-C  Multiple Vitamin (MULTIVITAMIN) capsule Take 1 capsule by mouth daily.    [provider]  naproxen (NAPROSYN) 500 MG tablet Take 1 tablet (500 mg total) by mouth 2 (two) times daily. 06/18/19   Petrucelli, Samantha R, PA-C  NORLYDA 0.35 MG tablet  06/10/19   [provider]  fluticasone (FLONASE) 50 MCG/ACT nasal spray Place 2 sprays into both nostrils daily. Patient not taking: Reported on 01/27/2019 06/05/18 05/01/19  Palumbo, April, MD  medroxyPROGESTERone (PROVERA) 10 MG tablet Take 1 tablet (10 mg total) by mouth daily for 7 days. 03/04/19 05/01/19  Sharlene Dory, DO  SUMAtriptan (IMITREX) 100 MG tablet Take 1 tablet (100 mg total) by mouth every 2 (two) hours as needed for migraine. May repeat in 2 hours if headache persists or recurs. Patient not taking: Reported on 03/27/2019 03/04/19 05/01/19  Sharlene Dory, DO  Past Surgical History Past Surgical History:  Procedure Laterality Date  . NO PAST SURGERIES    . UPPER GASTROINTESTINAL ENDOSCOPY  08/28/2019   Family History Family History  Problem Relation Age of Onset  . Diabetes Father   . Hypertension Mother   . Diabetes Sister   . Colon cancer Cousin   . Rectal cancer Cousin   . Cancer Neg Hx   . Colon polyps Neg Hx   . Esophageal cancer Neg Hx   . Stomach cancer Neg Hx     Social History Social History   Tobacco Use  . Smoking status: Never Smoker  . Smokeless tobacco: Never Used  Vaping Use  . Vaping Use: Never used  Substance Use Topics  . Alcohol use: No  . Drug use: No   Allergies Peach [prunus persica]  Review of Systems Review of Systems All other systems are reviewed and are negative for acute change except as noted in the HPI  Physical Exam Vital Signs  I have reviewed the triage vital signs BP 108/78  (BP Location: Left Arm)   Pulse 86   Temp 98.5 F (36.9 C) (Oral)   Resp 16   Ht 5\' 3"  (1.6 m)   Wt 95.7 kg   LMP 09/02/2019   SpO2 100%   BMI 37.38 kg/m   Physical Exam Vitals reviewed.  Constitutional:      General: She is not in acute distress.    Appearance: She is well-developed. She is not diaphoretic.  HENT:     Head: Normocephalic and atraumatic.      Right Ear: Tympanic membrane and external ear normal.     Left Ear: Tympanic membrane and external ear normal.     Nose: Nose normal.     Mouth/Throat:     Lips: No lesions.     Pharynx: Oropharynx is clear.     Tonsils: No tonsillar exudate.  Eyes:     General: No scleral icterus.    Conjunctiva/sclera: Conjunctivae normal.  Neck:     Trachea: Phonation normal.  Cardiovascular:     Rate and Rhythm: Normal rate and regular rhythm.  Pulmonary:     Effort: Pulmonary effort is normal. No respiratory distress.     Breath sounds: No stridor.  Abdominal:     General: There is no distension.  Musculoskeletal:        General: Normal range of motion.     Cervical back: Normal range of motion.  Neurological:     Mental Status: She is alert and oriented to person, place, and time.  Psychiatric:        Behavior: Behavior normal.     ED Results and Treatments Labs (all labs ordered are listed, but only abnormal results are displayed) Labs Reviewed - No data to display                                                                                                                       EKG  EKG Interpretation  Date/Time:    Ventricular Rate:    PR Interval:    QRS Duration:   QT Interval:    QTC Calculation:   R Axis:     Text Interpretation:        Radiology No results found.  Pertinent labs & imaging results that were available during my care of the patient were reviewed by me and considered in my medical decision making (see chart for details).  Medications Ordered in ED Medications - No data to  display                                                                                                                                  Procedures Procedures  (including critical care time)  Medical Decision Making / ED Course I have reviewed the nursing notes for this encounter and the patient's prior records (if available in EHR or on provided paperwork).   Brianna Byrd was evaluated in Emergency Department on 09/04/2019 for the symptoms described in the history of present illness. She was evaluated in the context of the global COVID-19 pandemic, which necessitated consideration that the patient might be at risk for infection with the SARS-CoV-2 virus that causes COVID-19. Institutional protocols and algorithms that pertain to the evaluation of patients at risk for COVID-19 are in a state of rapid change based on information released by regulatory bodies including the CDC and federal and state organizations. These policies and algorithms were followed during the patient's care in the ED.  Appears to be infected ingrown hair.  No obvious fluctuance for drainage. No evidence of other infection including URI or otitis that would be attributing to lymphadenopathy.  Lymphadenopathy is likely reactive from the cutaneous infection.  Will prescribe a few days worth of antibiotics.      Final Clinical Impression(s) / ED Diagnoses Final diagnoses:  Ingrowing hair   The patient appears reasonably screened and/or stabilized for discharge and I doubt any other medical condition or other [facility] requiring further screening, evaluation, or treatment in the ED at this time prior to discharge. Safe for discharge with strict return precautions.  Disposition: Discharge  Condition: Good  I have discussed the results, Dx and Tx plan with the patient/family who expressed understanding and agree(s) with the plan. Discharge instructions discussed at length. The patient/family was given strict return  precautions who verbalized understanding of the instructions. No further questions at time of discharge.    ED Discharge Orders         Ordered    clindamycin (CLEOCIN) 150 MG capsule  3 times daily     Discontinue  Reprint     09/04/19 0132          Follow Up: Primary care provider  Schedule an appointment as soon as possible for a visit  If you do not have a primary care physician, contact [facility] at [phone] for referral  This chart was dictated using voice recognition software.  Despite best efforts to proofread,  errors can occur which can change the documentation meaning.   Nira Conn, MD 09/04/19 [phone]

## 2019-09-18 ENCOUNTER — Ambulatory Visit: Payer: Medicaid Other | Admitting: Family Medicine

## 2019-09-23 ENCOUNTER — Other Ambulatory Visit: Payer: Self-pay

## 2019-09-23 ENCOUNTER — Other Ambulatory Visit (HOSPITAL_COMMUNITY)
Admission: RE | Admit: 2019-09-23 | Discharge: 2019-09-23 | Disposition: A | Discharge status: Home / Self Care | Payer: Medicaid Other | Source: Ambulatory Visit | Attending: Obstetrics & Gynecology | Admitting: Obstetrics & Gynecology

## 2019-09-23 ENCOUNTER — Encounter: Payer: Self-pay | Admitting: Obstetrics & Gynecology

## 2019-09-23 ENCOUNTER — Ambulatory Visit (INDEPENDENT_AMBULATORY_CARE_PROVIDER_SITE_OTHER): Payer: Self-pay | Admitting: Obstetrics & Gynecology

## 2019-09-23 VITALS — BP 102/70 | HR 92 | Wt 208.0 lb

## 2019-09-23 DIAGNOSIS — R21 Rash and other nonspecific skin eruption: Secondary | ICD-10-CM

## 2019-09-23 DIAGNOSIS — L2389 Allergic contact dermatitis due to other agents: Secondary | ICD-10-CM

## 2019-09-23 DIAGNOSIS — Z113 Encounter for screening for infections with a predominantly sexual mode of transmission: Secondary | ICD-10-CM | POA: Insufficient documentation

## 2019-09-23 DIAGNOSIS — N7689 Other specified inflammation of vagina and vulva: Secondary | ICD-10-CM

## 2019-09-23 MED ORDER — CLOTRIMAZOLE-BETAMETHASONE 1-0.05 % EX CREA: 1.0000 "application " | TOPICAL_CREAM | Freq: Two times a day (BID) | CUTANEOUS | 1 refills | Status: DC

## 2019-09-23 NOTE — Progress Notes (Signed)
Pt states bleeding has improved Pt states she did a "vaginal herb detox" and is now having irritation and a rash Pt states she had a pap smear in 2019 and it was negative Pt wants STD Testing

## 2019-09-23 NOTE — Progress Notes (Signed)
History:  24 y.o. G1P1001 here today for vaginal pain and irritation. Pt is s/p a 'verbal detox' that she found online. She left the tampon like application in for 72 hours as instructed. She reports that there was immediate pain and burning that never improved and is now worse.  She reports itching and burning and now pain. She is not currently sexually active but, is worried about an STI and wants to be screened.   The following portions of the patient's history were reviewed and updated as appropriate: allergies, current medications, past family history, past medical history, past social history, past surgical history and problem list.  Review of Systems:  Pertinent items are noted in HPI.    Objective:  Physical Exam Blood pressure 102/70, pulse 92, weight 208 lb (94.3 kg), last menstrual period 09/02/2019.  CONSTITUTIONAL: Well-developed, well-nourished female in no acute distress.  HENT:  Normocephalic, atraumatic EYES: Conjunctivae and EOM are normal. No scleral icterus.  NECK: Normal range of motion SKIN: Skin is warm and dry. No rash noted. Not diaphoretic.No pallor. NEUROLGIC: Alert and oriented to person, place, and time. Normal coordination.  Pelvic: External genitalia- swelling; erythema and excoriations noted on her labia majus. No lesions noted. This is even throughout the labia. The area is tender. The vaginal mucosa is swollen and tender but, there are no lesions and there is no abnormal vaginal discharge.    Assessment & Plan:  Allergic reaction of vulva/labia majus   Lotrisone cream to ext genitalia bid  F/u in 2 weeks or sooner prn  Pt counseled to stop all OTC products.    STI screen  HIV, RPR, Hep B and C.   Brianna Byrd, M.D., [place]

## 2019-09-24 LAB — SYPHILIS: RPR W/REFLEX TO RPR TITER AND TREPONEMAL ANTIBODIES, TRADITIONAL SCREENING AND DIAGNOSIS ALGORITHM: RPR Ser Ql: NONREACTIVE

## 2019-09-24 LAB — HEPATITIS C ANTIBODY: Hep C Virus Ab: 0.3 s/co ratio (ref 0.0–0.9)

## 2019-09-24 LAB — HEPATITIS B SURFACE ANTIGEN: Hepatitis B Surface Ag: NEGATIVE

## 2019-09-24 LAB — HIV ANTIBODY (ROUTINE TESTING W REFLEX): HIV Screen 4th Generation wRfx: NONREACTIVE

## 2019-09-25 LAB — CERVICOVAGINAL ANCILLARY ONLY
Bacterial Vaginitis (gardnerella): NEGATIVE
Candida Glabrata: NEGATIVE
Candida Vaginitis: POSITIVE — AB
Chlamydia: NEGATIVE
Comment: NEGATIVE
Comment: NEGATIVE
Comment: NEGATIVE
Comment: NEGATIVE
Comment: NEGATIVE
Comment: NORMAL
Neisseria Gonorrhea: NEGATIVE
Trichomonas: NEGATIVE

## 2019-10-07 ENCOUNTER — Other Ambulatory Visit: Payer: Self-pay | Admitting: Obstetrics & Gynecology

## 2019-10-07 DIAGNOSIS — B373 Candidiasis of vulva and vagina: Secondary | ICD-10-CM

## 2019-10-07 DIAGNOSIS — B3731 Acute candidiasis of vulva and vagina: Secondary | ICD-10-CM

## 2019-10-07 MED ORDER — FLUCONAZOLE 150 MG PO TABS: 150.0000 mg | ORAL_TABLET | ORAL | 0 refills | Status: DC

## 2019-10-08 ENCOUNTER — Ambulatory Visit: Payer: Medicaid Other | Admitting: Obstetrics & Gynecology

## 2019-10-24 ENCOUNTER — Emergency Department (HOSPITAL_BASED_OUTPATIENT_CLINIC_OR_DEPARTMENT_OTHER): Payer: Self-pay

## 2019-10-24 ENCOUNTER — Other Ambulatory Visit: Payer: Self-pay

## 2019-10-24 ENCOUNTER — Emergency Department (HOSPITAL_BASED_OUTPATIENT_CLINIC_OR_DEPARTMENT_OTHER)
Admission: EM | Admit: 2019-10-24 | Discharge: 2019-10-24 | Disposition: A | Discharge status: Home / Self Care | Payer: Self-pay | Attending: Emergency Medicine | Admitting: Emergency Medicine

## 2019-10-24 ENCOUNTER — Encounter (HOSPITAL_BASED_OUTPATIENT_CLINIC_OR_DEPARTMENT_OTHER): Payer: Self-pay | Admitting: *Deleted

## 2019-10-24 DIAGNOSIS — R110 Nausea: Secondary | ICD-10-CM | POA: Insufficient documentation

## 2019-10-24 DIAGNOSIS — J45909 Unspecified asthma, uncomplicated: Secondary | ICD-10-CM | POA: Insufficient documentation

## 2019-10-24 DIAGNOSIS — R142 Eructation: Secondary | ICD-10-CM | POA: Insufficient documentation

## 2019-10-24 DIAGNOSIS — R079 Chest pain, unspecified: Secondary | ICD-10-CM | POA: Insufficient documentation

## 2019-10-24 LAB — CBC
HCT: 33.1 % — ABNORMAL LOW (ref 36.0–46.0)
Hemoglobin: 9.7 g/dL — ABNORMAL LOW (ref 12.0–15.0)
MCH: 21.2 pg — ABNORMAL LOW (ref 26.0–34.0)
MCHC: 29.3 g/dL — ABNORMAL LOW (ref 30.0–36.0)
MCV: 72.3 fL — ABNORMAL LOW (ref 80.0–100.0)
Platelets: 421 10*3/uL — ABNORMAL HIGH (ref 150–400)
RBC: 4.58 MIL/uL (ref 3.87–5.11)
RDW: 18.6 % — ABNORMAL HIGH (ref 11.5–15.5)
WBC: 6.6 10*3/uL (ref 4.0–10.5)
nRBC: 0 % (ref 0.0–0.2)

## 2019-10-24 LAB — COMPREHENSIVE METABOLIC PANEL
ALT: 9 U/L (ref 0–44)
AST: 14 U/L — ABNORMAL LOW (ref 15–41)
Albumin: 3.7 g/dL (ref 3.5–5.0)
Alkaline Phosphatase: 71 U/L (ref 38–126)
Anion gap: 9 (ref 5–15)
BUN: 6 mg/dL (ref 6–20)
CO2: 23 mmol/L (ref 22–32)
Calcium: 8.7 mg/dL — ABNORMAL LOW (ref 8.9–10.3)
Chloride: 106 mmol/L (ref 98–111)
Creatinine, Ser: 0.7 mg/dL (ref 0.44–1.00)
GFR calc Af Amer: >60 mL/min (ref >60)
GFR calc non Af Amer: >60 mL/min (ref >60)
Glucose, Bld: 86 mg/dL (ref 70–99)
Potassium: 3.8 mmol/L (ref 3.5–5.1)
Sodium: 138 mmol/L (ref 135–145)
Total Bilirubin: 0.3 mg/dL (ref 0.3–1.2)
Total Protein: 7.7 g/dL (ref 6.5–8.1)

## 2019-10-24 LAB — URINALYSIS, ROUTINE W REFLEX MICROSCOPIC
Bilirubin Urine: NEGATIVE
Glucose, UA: NEGATIVE mg/dL
Hgb urine dipstick: NEGATIVE
Ketones, ur: NEGATIVE mg/dL
Leukocytes,Ua: NEGATIVE
Nitrite: NEGATIVE
Protein, ur: NEGATIVE mg/dL
Specific Gravity, Urine: 1.025 (ref 1.005–1.030)
pH: 8 (ref 5.0–8.0)

## 2019-10-24 LAB — LIPASE, BLOOD: Lipase: 27 U/L (ref 11–51)

## 2019-10-24 LAB — PREGNANCY, URINE: Preg Test, Ur: NEGATIVE

## 2019-10-24 MED ORDER — SUCRALFATE 1 G PO TABS
1.0000 g | ORAL_TABLET | Freq: Three times a day (TID) | ORAL | 0 refills | Status: DC
Start: 2019-10-24 — End: 2020-06-10

## 2019-10-24 MED ORDER — FAMOTIDINE 20 MG PO TABS
20.0000 mg | ORAL_TABLET | Freq: Two times a day (BID) | ORAL | 0 refills | Status: DC
Start: 2019-10-24 — End: 2020-05-18

## 2019-10-24 MED ORDER — LIDOCAINE VISCOUS HCL 2 % MT SOLN
15.0000 mL | Freq: Once | OROMUCOSAL | Status: AC
  Administered 2019-10-24: 15 mL via ORAL
  Filled 2019-10-24: qty 15

## 2019-10-24 MED ORDER — ALUM & MAG HYDROXIDE-SIMETH 200-200-20 MG/5ML PO SUSP
30.0000 mL | Freq: Once | ORAL | Status: AC
  Administered 2019-10-24: 30 mL via ORAL
  Filled 2019-10-24: qty 30

## 2019-10-24 NOTE — ED Triage Notes (Signed)
Pt reports belching and epigastric pain since last night. She had upper endo done in June and has been on omeprazole. She is concerned for ulcer

## 2019-10-24 NOTE — ED Provider Notes (Signed)
[facility] EMERGENCY DEPARTMENT Provider Note   CSN: [id number] Arrival date & time: 10/24/19  1322     History Chief Complaint  Patient presents with  . Abdominal Pain    Brianna Byrd is a 23 y.o. female.  Patient with history of reflux, upper endoscopy performed in 08/2019 showing possible gastritis, on and compliant with PPI --presents the emergency department today for nausea, belching, and chest tightness.  Patient has tightness on the left side of her chest exacerbated by frequent belching.  She denies abdominal pain, vomiting.  No fever, cough, congestion.  No diarrhea or urinary symptoms.  Symptoms became worse last night.        Past Medical History:  Diagnosis Date  . Asthma   . Obesity   . Reflux     Patient Active Problem List   Diagnosis Date Noted  . Abdominal bloating 07/30/2019  . Heartburn 07/30/2019  . Migraine without aura and without status migrainosus, not intractable 03/04/2019  . Left breast mass 01/27/2019  . History of pre-eclampsia 02/12/2017  . SVD (spontaneous vaginal delivery) 12/26/2016    Past Surgical History:  Procedure Laterality Date  . NO PAST SURGERIES    . UPPER GASTROINTESTINAL ENDOSCOPY  08/28/2019     OB History    Gravida  1   Para  1   Term  1   Preterm      AB      Living  1     SAB      TAB      Ectopic      Multiple  0   Live Births  1           Family History  Problem Relation Age of Onset  . Diabetes Father   . Hypertension Mother   . Diabetes Sister   . Colon cancer Cousin   . Rectal cancer Cousin   . Cancer Neg Hx   . Colon polyps Neg Hx   . Esophageal cancer Neg Hx   . Stomach cancer Neg Hx     Social History   Tobacco Use  . Smoking status: Never Smoker  . Smokeless tobacco: Never Used  Vaping Use  . Vaping Use: Never used  Substance Use Topics  . Alcohol use: No  . Drug use: No    Home Medications Prior to Admission medications   Medication Sig  Start Date End Date Taking? Authorizing Provider  Cholecalciferol 1.25 MG (50000 UT) capsule Frequency:weekly   Dosage:50000   UNIT  Instructions:D3-50 50000UNIT, 1 (one) Capsule Capsule weekly  Note: 06/01/13   [provider]  clotrimazole-betamethasone (LOTRISONE) cream Apply 1 application topically 2 (two) times daily. 09/23/19   Willodean Rosenthal, MD  esomeprazole (NEXIUM) 40 MG capsule Take 1 capsule (40 mg total) by mouth daily before breakfast. 08/28/19   Mansouraty, Netty Starring., MD  ferrous sulfate 325 (65 FE) MG tablet Take 1 tablet (325 mg total) by mouth daily. 06/18/19   Petrucelli, Samantha R, PA-C  fluconazole (DIFLUCAN) 150 MG tablet Take 1 tablet (150 mg total) by mouth every 3 (three) days. For three doses 10/07/19   Willodean Rosenthal, MD  ibuprofen (ADVIL) 800 MG tablet Take 800 mg by mouth 3 (three) times daily. 06/28/19   [provider]  methocarbamol (ROBAXIN) 500 MG tablet Take 1 tablet (500 mg total) by mouth every 8 (eight) hours as needed. 06/18/19   Petrucelli, Samantha R, PA-C  Multiple Vitamin (MULTIVITAMIN) capsule Take 1 capsule by mouth  daily.    [provider]  naproxen (NAPROSYN) 500 MG tablet Take 1 tablet (500 mg total) by mouth 2 (two) times daily. 06/18/19   Petrucelli, Samantha R, PA-C  NORLYDA 0.35 MG tablet  06/10/19   [provider]  fluticasone (FLONASE) 50 MCG/ACT nasal spray Place 2 sprays into both nostrils daily. Patient not taking: Reported on 01/27/2019 06/05/18 05/01/19  Palumbo, April, MD  medroxyPROGESTERone (PROVERA) 10 MG tablet Take 1 tablet (10 mg total) by mouth daily for 7 days. 03/04/19 05/01/19  Sharlene Dory, DO  SUMAtriptan (IMITREX) 100 MG tablet Take 1 tablet (100 mg total) by mouth every 2 (two) hours as needed for migraine. May repeat in 2 hours if headache persists or recurs. Patient not taking: Reported on 03/27/2019 03/04/19 05/01/19  Sharlene Dory, DO    Allergies    Peach  [prunus persica]  Review of Systems   Review of Systems  Constitutional: Negative for fever.  HENT: Negative for rhinorrhea and sore throat.   Eyes: Negative for redness.  Respiratory: Positive for chest tightness. Negative for cough and shortness of breath.   Cardiovascular: Negative for chest pain.  Gastrointestinal: Positive for nausea. Negative for abdominal pain, diarrhea and vomiting.  Genitourinary: Negative for dysuria, frequency, hematuria and urgency.  Musculoskeletal: Negative for myalgias.  Skin: Negative for rash.  Neurological: Negative for headaches.    Physical Exam Updated Vital Signs BP 118/78 (BP Location: Left Arm)   Pulse 81   Temp 98.7 F (37.1 C) (Oral)   Resp 18   Ht 5\' 1"  (1.549 m)   Wt 93.9 kg   LMP 10/09/2019   SpO2 99%   BMI 39.11 kg/m   Physical Exam Vitals and nursing note reviewed.  Constitutional:      General: She is not in acute distress.    Appearance: She is well-developed.  HENT:     Head: Normocephalic and atraumatic.     Right Ear: External ear normal.     Left Ear: External ear normal.     Nose: Nose normal.  Eyes:     Conjunctiva/sclera: Conjunctivae normal.  Cardiovascular:     Rate and Rhythm: Normal rate and regular rhythm.     Heart sounds: No murmur heard.   Pulmonary:     Effort: No respiratory distress.     Breath sounds: No wheezing, rhonchi or rales.  Abdominal:     Palpations: Abdomen is soft.     Tenderness: There is no abdominal tenderness. There is no guarding or rebound.     Comments: Frequent belching during exam.  Musculoskeletal:     Cervical back: Normal range of motion and neck supple.     Right lower leg: No edema.     Left lower leg: No edema.  Skin:    General: Skin is warm and dry.     Findings: No rash.  Neurological:     General: No focal deficit present.     Mental Status: She is alert. Mental status is at baseline.     Motor: No weakness.  Psychiatric:        Mood and Affect: Mood  normal.     ED Results / Procedures / Treatments   Labs (all labs ordered are listed, but only abnormal results are displayed) Labs Reviewed  COMPREHENSIVE METABOLIC PANEL - Abnormal; Notable for the following components:      Result Value   Calcium 8.7 (*)    AST 14 (*)  All other components within normal limits  CBC - Abnormal; Notable for the following components:   Hemoglobin 9.7 (*)    HCT 33.1 (*)    MCV 72.3 (*)    MCH 21.2 (*)    MCHC 29.3 (*)    RDW 18.6 (*)    Platelets 421 (*)    All other components within normal limits  URINALYSIS, ROUTINE W REFLEX MICROSCOPIC - Abnormal; Notable for the following components:   APPearance HAZY (*)    All other components within normal limits  LIPASE, BLOOD  PREGNANCY, URINE    EKG None  Radiology DG Chest 2 View  Result Date: 10/24/2019 CLINICAL DATA:  Chest tightness, reflux EXAM: CHEST - 2 VIEW COMPARISON:  06/18/2019 FINDINGS: The heart size and mediastinal contours are within normal limits. Both lungs are clear. The visualized skeletal structures are unremarkable. IMPRESSION: No acute abnormality of the lungs. Electronically Signed   By: Lauralyn Primes M.D.   On: 10/24/2019 14:38    Procedures Procedures (including critical care time)  Medications Ordered in ED Medications - No data to display  ED Course  I have reviewed the triage vital signs and the nursing notes.  Pertinent labs & imaging results that were available during my care of the patient were reviewed by me and considered in my medical decision making (see chart for details).  Patient seen and examined.  Abdominal pain protocol labs ordered on arrival.  I added chest x-ray, GI cocktail.  Patient does not have any upper abdominal pain and I do not feel that ultrasonography will be helpful at this time.  Patient looks well.  Abdomen is soft and nontender.  Vital signs reviewed and are as follows: BP 118/78 (BP Location: Left Arm)   Pulse 81   Temp 98.7 F  (37.1 C) (Oral)   Resp 18   Ht 5\' 1"  (1.549 m)   Wt 93.9 kg   LMP 10/09/2019   SpO2 99%   BMI 39.11 kg/m   3:13 PM work-up reassuring.  Reviewed results with patient at bedside.  Chest tightness improved with GI cocktail.  She continues to have belching.  Plan: Addition of Pepcid and Carafate for the next 2 weeks.  She will continue Nexium.  Encouraged recheck with PCP and/or GI.  The patient was urged to return to the Emergency Department immediately with worsening of current symptoms, worsening abdominal pain, persistent vomiting, blood noted in stools, fever, or any other concerns. The patient verbalized understanding.      MDM Rules/Calculators/A&P                          Patient with chest tightness, belching, nausea.  History of gastritis. Vitals are stable, no fever. Labs are reassuring, chronic anemia. Imaging CXR neg, otherwise not felt indicated 2/2 lack of abd pain. No signs of dehydration, patient is tolerating PO's. Lungs are clear and no signs suggestive of PNA. Low concern for appendicitis, cholecystitis, pancreatitis, ruptured viscus, UTI, kidney stone, aortic dissection, aortic aneurysm or other emergent abdominal etiology. Supportive therapy indicated with return if symptoms worsen.    Final Clinical Impression(s) / ED Diagnoses Final diagnoses:  Belching  Nausea    Rx / DC Orders ED Discharge Orders         Ordered    famotidine (PEPCID) 20 MG tablet  2 times daily     Discontinue  Reprint     10/24/19 1510    sucralfate (CARAFATE)  1 g tablet  3 times daily with meals & bedtime     Discontinue  Reprint     10/24/19 1510           Renne CriglerGeiple, Dauntae Derusha, PA-C 10/24/19 1514    Long, Arlyss RepressJoshua G, MD 10/25/19 [phone]

## 2019-10-24 NOTE — Discharge Instructions (Signed)
Please read and follow all provided instructions.  Your diagnoses today include:  1. Belching   2. Nausea     Tests performed today include:  Blood counts and electrolytes - mild anemia  Blood tests to check liver and kidney function  Blood tests to check pancreas function  Urine test to look for infection  Chest x-ray - is normal  Vital signs. See below for your results today.   Medications prescribed:   Pepcid (famotidine) - antihistamine  You can find this medication over-the-counter.   DO NOT exceed:   20mg  Pepcid every 12 hours   Carafate - for stomach upset and to protect your stomach  Take any prescribed medications only as directed.  Home care instructions:   Follow any educational materials contained in this packet.  Follow-up instructions: Please follow-up with your primary care provider or gastro doctor in the next 7 days for further evaluation of your symptoms.    Return instructions:  SEEK IMMEDIATE MEDICAL ATTENTION IF:  The pain does not go away or becomes severe   A temperature above 101F develops   Repeated vomiting occurs (multiple episodes)   The pain becomes localized to portions of the abdomen. The right side could possibly be appendicitis. In an adult, the left lower portion of the abdomen could be colitis or diverticulitis.   Blood is being passed in stools or vomit (bright red or black tarry stools)   You develop chest pain, difficulty breathing, dizziness or fainting, or become confused, poorly responsive, or inconsolable (young children)  If you have any other emergent concerns regarding your health  Additional Information: Abdominal (belly) pain can be caused by many things. Your caregiver performed an examination and possibly ordered blood/urine tests and imaging (CT scan, x-rays, ultrasound). Many cases can be observed and treated at home after initial evaluation in the emergency department. Even though you are being discharged  home, abdominal pain can be unpredictable. Therefore, you need a repeated exam if your pain does not resolve, returns, or worsens. Most patients with abdominal pain don't have to be admitted to the hospital or have surgery, but serious problems like appendicitis and gallbladder attacks can start out as nonspecific pain. Many abdominal conditions cannot be diagnosed in one visit, so follow-up evaluations are very important.  Your vital signs today were: BP 118/78 (BP Location: Left Arm)    Pulse 81    Temp 98.7 F (37.1 C) (Oral)    Resp 18    Ht 5\' 1"  (1.549 m)    Wt 93.9 kg    LMP 10/09/2019    SpO2 99%    BMI 39.11 kg/m  If your blood pressure (bp) was elevated above 135/85 this visit, please have this repeated by your doctor within one month. --------------

## 2019-12-21 ENCOUNTER — Encounter (HOSPITAL_BASED_OUTPATIENT_CLINIC_OR_DEPARTMENT_OTHER): Payer: Self-pay | Admitting: Emergency Medicine

## 2019-12-21 ENCOUNTER — Other Ambulatory Visit: Payer: Self-pay

## 2019-12-21 ENCOUNTER — Emergency Department (HOSPITAL_BASED_OUTPATIENT_CLINIC_OR_DEPARTMENT_OTHER): Payer: Self-pay

## 2019-12-21 ENCOUNTER — Emergency Department (HOSPITAL_BASED_OUTPATIENT_CLINIC_OR_DEPARTMENT_OTHER)
Admission: EM | Admit: 2019-12-21 | Discharge: 2019-12-21 | Disposition: A | Discharge status: Home / Self Care | Payer: Self-pay | Attending: Emergency Medicine | Admitting: Emergency Medicine

## 2019-12-21 DIAGNOSIS — H539 Unspecified visual disturbance: Secondary | ICD-10-CM | POA: Insufficient documentation

## 2019-12-21 DIAGNOSIS — J45909 Unspecified asthma, uncomplicated: Secondary | ICD-10-CM | POA: Insufficient documentation

## 2019-12-21 DIAGNOSIS — M7918 Myalgia, other site: Secondary | ICD-10-CM | POA: Diagnosis present

## 2019-12-21 DIAGNOSIS — R200 Anesthesia of skin: Secondary | ICD-10-CM | POA: Insufficient documentation

## 2019-12-21 DIAGNOSIS — M542 Cervicalgia: Secondary | ICD-10-CM | POA: Diagnosis present

## 2019-12-21 LAB — CBC WITH DIFFERENTIAL/PLATELET
Abs Immature Granulocytes: 0.01 10*3/uL (ref 0.00–0.07)
Basophils Absolute: 0 10*3/uL (ref 0.0–0.1)
Basophils Relative: 0 %
Eosinophils Absolute: 0.1 10*3/uL (ref 0.0–0.5)
Eosinophils Relative: 1 %
HCT: 33.8 % — ABNORMAL LOW (ref 36.0–46.0)
Hemoglobin: 9.9 g/dL — ABNORMAL LOW (ref 12.0–15.0)
Immature Granulocytes: 0 %
Lymphocytes Relative: 28 %
Lymphs Abs: 1.8 10*3/uL (ref 0.7–4.0)
MCH: 21.8 pg — ABNORMAL LOW (ref 26.0–34.0)
MCHC: 29.3 g/dL — ABNORMAL LOW (ref 30.0–36.0)
MCV: 74.4 fL — ABNORMAL LOW (ref 80.0–100.0)
Monocytes Absolute: 0.5 10*3/uL (ref 0.1–1.0)
Monocytes Relative: 8 %
Neutro Abs: 4 10*3/uL (ref 1.7–7.7)
Neutrophils Relative %: 63 %
Platelets: 367 10*3/uL (ref 150–400)
RBC: 4.54 MIL/uL (ref 3.87–5.11)
RDW: 18.3 % — ABNORMAL HIGH (ref 11.5–15.5)
WBC: 6.4 10*3/uL (ref 4.0–10.5)
nRBC: 0 % (ref 0.0–0.2)

## 2019-12-21 LAB — COMPREHENSIVE METABOLIC PANEL
ALT: 10 U/L (ref 0–44)
AST: 15 U/L (ref 15–41)
Albumin: 3.6 g/dL (ref 3.5–5.0)
Alkaline Phosphatase: 58 U/L (ref 38–126)
Anion gap: 9 (ref 5–15)
BUN: 6 mg/dL (ref 6–20)
CO2: 24 mmol/L (ref 22–32)
Calcium: 8.4 mg/dL — ABNORMAL LOW (ref 8.9–10.3)
Chloride: 107 mmol/L (ref 98–111)
Creatinine, Ser: 0.62 mg/dL (ref 0.44–1.00)
GFR calc Af Amer: >60 mL/min (ref >60)
GFR calc non Af Amer: >60 mL/min (ref >60)
Glucose, Bld: 88 mg/dL (ref 70–99)
Potassium: 3.9 mmol/L (ref 3.5–5.1)
Sodium: 140 mmol/L (ref 135–145)
Total Bilirubin: 0.3 mg/dL (ref 0.3–1.2)
Total Protein: 7.2 g/dL (ref 6.5–8.1)

## 2019-12-21 LAB — D-DIMER, QUANTITATIVE: D-Dimer, Quant: 0.62 ug/mL-FEU — ABNORMAL HIGH (ref 0.00–0.50)

## 2019-12-21 LAB — PREGNANCY, URINE: Preg Test, Ur: NEGATIVE

## 2019-12-21 MED ORDER — CYCLOBENZAPRINE HCL 5 MG PO TABS: 5.0000 mg | ORAL_TABLET | Freq: Three times a day (TID) | ORAL | 0 refills | Status: AC | PRN

## 2019-12-21 MED ORDER — IOHEXOL 300 MG/ML  SOLN
100.0000 mL | Freq: Once | INTRAMUSCULAR | Status: AC | PRN
  Administered 2019-12-21: 75 mL via INTRAVENOUS

## 2019-12-21 NOTE — Discharge Instructions (Signed)
It is a pleasure getting to know you today.  Your neck CT is negative for blood clots or adenopathy.  Your electrolytes are unremarkable.  The blood work is consistent with iron deficiency anemia.  Please continue taking iron sulfate and follow-up with your PCP for recheck.  You can start taking Flexeril 5 mg 3 times a day as needed for neck also spasm.  Please try light stretching exercise for your neck and shoulder.  Come back into the ED if worsening symptoms.

## 2019-12-21 NOTE — ED Provider Notes (Incomplete)
[facility] EMERGENCY DEPARTMENT Provider Note   CSN: [id number] Arrival date & time: 12/21/19  8502     History Chief Complaint  Patient presents with  . Neck Pain    Brianna Byrd is a 24 y.o. female.  HPI     Past Medical History:  Diagnosis Date  . Asthma   . Obesity   . Reflux     Patient Active Problem List   Diagnosis Date Noted  . Abdominal bloating 07/30/2019  . Heartburn 07/30/2019  . Migraine without aura and without status migrainosus, not intractable 03/04/2019  . Left breast mass 01/27/2019  . History of pre-eclampsia 02/12/2017  . SVD (spontaneous vaginal delivery) 12/26/2016    Past Surgical History:  Procedure Laterality Date  . NO PAST SURGERIES    . UPPER GASTROINTESTINAL ENDOSCOPY  08/28/2019     OB History    Gravida  1   Para  1   Term  1   Preterm      AB      Living  1     SAB      TAB      Ectopic      Multiple  0   Live Births  1           Family History  Problem Relation Age of Onset  . Diabetes Father   . Hypertension Mother   . Diabetes Sister   . Colon cancer Cousin   . Rectal cancer Cousin   . Cancer Neg Hx   . Colon polyps Neg Hx   . Esophageal cancer Neg Hx   . Stomach cancer Neg Hx     Social History   Tobacco Use  . Smoking status: Never Smoker  . Smokeless tobacco: Never Used  Vaping Use  . Vaping Use: Never used  Substance Use Topics  . Alcohol use: No  . Drug use: No    Home Medications Prior to Admission medications   Medication Sig Start Date End Date Taking? Authorizing Provider  Cholecalciferol 1.25 MG (50000 UT) capsule Frequency:weekly   Dosage:50000   UNIT  Instructions:D3-50 50000UNIT, 1 (one) Capsule Capsule weekly  Note: 06/01/13   [provider]  clotrimazole-betamethasone (LOTRISONE) cream Apply 1 application topically 2 (two) times daily. 09/23/19   Willodean Rosenthal, MD  esomeprazole (NEXIUM) 40 MG capsule Take 1 capsule (40 mg total) by  mouth daily before breakfast. 08/28/19   Mansouraty, Netty Starring., MD  famotidine (PEPCID) 20 MG tablet Take 1 tablet (20 mg total) by mouth 2 (two) times daily. 10/24/19   Renne Crigler, PA-C  ferrous sulfate 325 (65 FE) MG tablet Take 1 tablet (325 mg total) by mouth daily. 06/18/19   Petrucelli, Samantha R, PA-C  fluconazole (DIFLUCAN) 150 MG tablet Take 1 tablet (150 mg total) by mouth every 3 (three) days. For three doses 10/07/19   Willodean Rosenthal, MD  ibuprofen (ADVIL) 800 MG tablet Take 800 mg by mouth 3 (three) times daily. 06/28/19   [provider]  methocarbamol (ROBAXIN) 500 MG tablet Take 1 tablet (500 mg total) by mouth every 8 (eight) hours as needed. 06/18/19   Petrucelli, Samantha R, PA-C  Multiple Vitamin (MULTIVITAMIN) capsule Take 1 capsule by mouth daily.    [provider]  naproxen (NAPROSYN) 500 MG tablet Take 1 tablet (500 mg total) by mouth 2 (two) times daily. 06/18/19   Petrucelli, Pleas Koch, PA-C  NORLYDA 0.35 MG tablet  06/10/19   [provider]  sucralfate (CARAFATE) 1 g tablet Take 1 tablet (1 g total) by mouth 4 (four) times daily -  with meals and at bedtime. 10/24/19   Renne Crigler, PA-C  fluticasone (FLONASE) 50 MCG/ACT nasal spray Place 2 sprays into both nostrils daily. Patient not taking: Reported on 01/27/2019 06/05/18 05/01/19  Palumbo, April, MD  medroxyPROGESTERone (PROVERA) 10 MG tablet Take 1 tablet (10 mg total) by mouth daily for 7 days. 03/04/19 05/01/19  Sharlene Dory, DO  SUMAtriptan (IMITREX) 100 MG tablet Take 1 tablet (100 mg total) by mouth every 2 (two) hours as needed for migraine. May repeat in 2 hours if headache persists or recurs. Patient not taking: Reported on 03/27/2019 03/04/19 05/01/19  Sharlene Dory, DO    Allergies    Peach [prunus persica]  Review of Systems   Review of Systems  Physical Exam Updated Vital Signs BP 113/79 (BP Location: Right Arm)   Pulse 85   Temp 98.2 F (36.8 C)  (Oral)   Resp 18   Ht 5\' 1"  (1.549 m)   Wt 90.7 kg   SpO2 99%   BMI 37.79 kg/m   Physical Exam  ED Results / Procedures / Treatments   Labs (all labs ordered are listed, but only abnormal results are displayed) Labs Reviewed - No data to display  EKG None  Radiology No results found.  Procedures Procedures (including critical care time)  Medications Ordered in ED Medications - No data to display  ED Course  I have reviewed the triage vital signs and the nursing notes.  Pertinent labs & imaging results that were available during my care of the patient were reviewed by me and considered in my medical decision making (see chart for details).    MDM Rules/Calculators/A&P                          *** Final Clinical Impression(s) / ED Diagnoses Final diagnoses:  None    Rx / DC Orders ED Discharge Orders    None

## 2019-12-21 NOTE — ED Provider Notes (Signed)
[facility] EMERGENCY DEPARTMENT Provider Note   CSN: [id number] Arrival date & time: 12/21/19  2979     History Chief Complaint  Patient presents with  . Neck Pain    Brianna Byrd is a 24 y.o. female with past medical history of asthma, GERD, coagulopathy with elevated factor VIII and von Willebrand, who presents to the ED for neck pain.  She noticed 2 lumps on left neck 2 days ago, associated with neck pain radiates to head and shoulder and back.  Described pain as aching and rates 7 out of 10.  Pain is worse with movement such as neck rotation and better with rest.  She has tried heat pads, ibuprofen and Tylenol with minimal relief.  Of note she had an elevated factor VIII and von Willebrand, which could be elevated compared to baseline pregnancy and stress.  No repeat labs were performed to confirm.  The history is provided by the patient.  Neck Pain Pain location:  L side Quality:  Aching Pain radiates to:  L shoulder and head Pain severity now: 7/10. Pain is:  Same all the time Onset quality:  Sudden Duration:  2 days Timing:  Intermittent Progression:  Unchanged Chronicity:  New Worsened by:  Nothing (with rest) Ineffective treatments:  Analgesics and heat Associated symptoms: headaches, numbness and visual change   Associated symptoms: no chest pain, no fever, no photophobia, no syncope, no tingling and no weakness   Associated symptoms comment:  Blurry vision for laying down to sitting up Risk factors: no hx of spinal trauma, no recent epidural, no recent head injury and no recurrent falls        Past Medical History:  Diagnosis Date  . Asthma   . Obesity   . Reflux     Patient Active Problem List   Diagnosis Date Noted  . Neck pain on left side 12/21/2019  . Musculoskeletal pain 12/21/2019  . Abdominal bloating 07/30/2019  . Heartburn 07/30/2019  . Migraine without aura and without status migrainosus, not intractable 03/04/2019  . Left  breast mass 01/27/2019  . History of pre-eclampsia 02/12/2017  . SVD (spontaneous vaginal delivery) 12/26/2016    Past Surgical History:  Procedure Laterality Date  . NO PAST SURGERIES    . UPPER GASTROINTESTINAL ENDOSCOPY  08/28/2019     OB History    Gravida  1   Para  1   Term  1   Preterm      AB      Living  1     SAB      TAB      Ectopic      Multiple  0   Live Births  1           Family History  Problem Relation Age of Onset  . Diabetes Father   . Hypertension Mother   . Diabetes Sister   . Colon cancer Cousin   . Rectal cancer Cousin   . Cancer Neg Hx   . Colon polyps Neg Hx   . Esophageal cancer Neg Hx   . Stomach cancer Neg Hx     Social History   Tobacco Use  . Smoking status: Never Smoker  . Smokeless tobacco: Never Used  Vaping Use  . Vaping Use: Never used  Substance Use Topics  . Alcohol use: No  . Drug use: No    Home Medications Prior to Admission medications   Medication Sig Start Date End Date Taking? Authorizing Provider  Cholecalciferol 1.25 MG (50000 UT) capsule Frequency:weekly   Dosage:50000   UNIT  Instructions:D3-50 50000UNIT, 1 (one) Capsule Capsule weekly  Note: 06/01/13   [provider]  clotrimazole-betamethasone (LOTRISONE) cream Apply 1 application topically 2 (two) times daily. 09/23/19   Willodean Rosenthal, MD  cyclobenzaprine (FLEXERIL) 5 MG tablet Take 1 tablet (5 mg total) by mouth 3 (three) times daily as needed for up to 7 days for muscle spasms. 12/21/19 12/28/19  Doran Stabler, DO  esomeprazole (NEXIUM) 40 MG capsule Take 1 capsule (40 mg total) by mouth daily before breakfast. 08/28/19   Mansouraty, Netty Starring., MD  famotidine (PEPCID) 20 MG tablet Take 1 tablet (20 mg total) by mouth 2 (two) times daily. 10/24/19   Renne Crigler, PA-C  ferrous sulfate 325 (65 FE) MG tablet Take 1 tablet (325 mg total) by mouth daily. 06/18/19   Petrucelli, Samantha R, PA-C  fluconazole (DIFLUCAN) 150 MG tablet  Take 1 tablet (150 mg total) by mouth every 3 (three) days. For three doses 10/07/19   Willodean Rosenthal, MD  ibuprofen (ADVIL) 800 MG tablet Take 800 mg by mouth 3 (three) times daily. 06/28/19   [provider]  Multiple Vitamin (MULTIVITAMIN) capsule Take 1 capsule by mouth daily.    [provider]  naproxen (NAPROSYN) 500 MG tablet Take 1 tablet (500 mg total) by mouth 2 (two) times daily. 06/18/19   Petrucelli, Samantha R, PA-C  NORLYDA 0.35 MG tablet  06/10/19   [provider]  sucralfate (CARAFATE) 1 g tablet Take 1 tablet (1 g total) by mouth 4 (four) times daily -  with meals and at bedtime. 10/24/19   Renne Crigler, PA-C  fluticasone (FLONASE) 50 MCG/ACT nasal spray Place 2 sprays into both nostrils daily. Patient not taking: Reported on 01/27/2019 06/05/18 05/01/19  Palumbo, April, MD  medroxyPROGESTERone (PROVERA) 10 MG tablet Take 1 tablet (10 mg total) by mouth daily for 7 days. 03/04/19 05/01/19  Sharlene Dory, DO  SUMAtriptan (IMITREX) 100 MG tablet Take 1 tablet (100 mg total) by mouth every 2 (two) hours as needed for migraine. May repeat in 2 hours if headache persists or recurs. Patient not taking: Reported on 03/27/2019 03/04/19 05/01/19  Sharlene Dory, DO    Allergies    Peach [prunus persica]  Review of Systems   Review of Systems  Constitutional: Negative for fever.  Eyes: Negative for photophobia.  Cardiovascular: Negative for chest pain and syncope.  Musculoskeletal: Positive for neck pain.  Neurological: Positive for numbness and headaches. Negative for tingling and weakness.    Physical Exam Updated Vital Signs BP 109/79 (BP Location: Right Arm)   Pulse 70   Temp 98.5 F (36.9 C) (Oral)   Resp 16   Ht 5\' 1"  (1.549 m)   Wt 90.7 kg   SpO2 99%   BMI 37.79 kg/m   Physical Exam Constitutional:      General: She is not in acute distress. HENT:     Head: Normocephalic.  Eyes:     General:        Right eye: No  discharge.        Left eye: No discharge.  Neck:     Comments: Tenderness to palpation of left neck.  Muscle hypertonicity noted at the neck and shoulder.  A very small lump palpated near the [facility] Cardiovascular:     Rate and Rhythm: Normal rate and regular rhythm.  Pulmonary:     Effort: No respiratory distress.  Breath sounds: Normal breath sounds.  Abdominal:     General: Bowel sounds are normal.  Musculoskeletal:     Cervical back: Normal range of motion. Tenderness present.     Right lower leg: No edema.     Left lower leg: No edema.  Skin:    General: Skin is warm.     Coloration: Skin is not jaundiced.  Neurological:     General: No focal deficit present.     Mental Status: She is alert.  Psychiatric:        Mood and Affect: Mood normal.     ED Results / Procedures / Treatments   Labs (all labs ordered are listed, but only abnormal results are displayed) Labs Reviewed  CBC WITH DIFFERENTIAL/PLATELET - Abnormal; Notable for the following components:      Result Value   Hemoglobin 9.9 (*)    HCT 33.8 (*)    MCV 74.4 (*)    MCH 21.8 (*)    MCHC 29.3 (*)    RDW 18.3 (*)    All other components within normal limits  COMPREHENSIVE METABOLIC PANEL - Abnormal; Notable for the following components:   Calcium 8.4 (*)    All other components within normal limits  D-DIMER, QUANTITATIVE (NOT [facility]) - Abnormal; Notable for the following components:   D-Dimer, Quant 0.62 (*)    All other components within normal limits  PREGNANCY, URINE    EKG EKG Interpretation  Date/Time:  Monday December 21 2019 10:38:44 EDT Ventricular Rate:  61 PR Interval:    QRS Duration: 85 QT Interval:  396 QTC Calculation: 399 R Axis:   73 Text Interpretation: Sinus arrhythmia Since prior ECG, rate has slowed Confirmed by Alvira MondaySchlossman, Erin ([id number]) on 12/21/2019 12:51:45 PM   Radiology CT Soft Tissue Neck W Contrast  Result Date: 12/21/2019 CLINICAL DATA:  Left neck lump EXAM: CT NECK  WITH CONTRAST TECHNIQUE: Multidetector CT imaging of the neck was performed using the standard protocol following the bolus administration of intravenous contrast. CONTRAST:  75mL OMNIPAQUE IOHEXOL 300 MG/ML  SOLN COMPARISON:  None. FINDINGS: Pharynx and larynx: Unremarkable.  No mass or swelling. Salivary glands: Unremarkable. Thyroid: Unremarkable. Lymph nodes: No enlarged lymph nodes identified. Vascular: Major neck vessels are patent. Limited intracranial: No abnormal enhancement. Visualized orbits: Unremarkable. Mastoids and visualized paranasal sinuses: Aerated. Skeleton: No significant osseous abnormality. Upper chest: No apical lung mass. Other: None. IMPRESSION: No neck mass or adenopathy. Electronically Signed   By: Guadlupe SpanishPraneil  Patel M.D.   On: 12/21/2019 12:26    Procedures Procedures (including critical care time)  Medications Ordered in ED Medications  iohexol (OMNIPAQUE) 300 MG/ML solution 100 mL (75 mLs Intravenous Contrast Given 12/21/19 1204)    ED Course  I have reviewed the triage vital signs and the nursing notes.  Pertinent labs & imaging results that were available during my care of the patient were reviewed by me and considered in my medical decision making (see chart for details).  Patient seen and examined.  She complains of neck pain especially to palpation and rotation.  A small lump palpated near the left SCM.  Muscle hypertonicity noted at neck and shoulder.  Vital signs stable.  BP 113/79 (BP Location: Right Arm)   Pulse 85   Temp 98.2 F (36.8 C) (Oral)   Resp 18   Ht 5\' 1"  (1.549 m)   Wt 90.7 kg   SpO2 99%   BMI 37.79 kg/m   CMP were unremarkable.  CBC shows  microcytic anemia consistent with history of iron deficiency is anemia.  Due to elevated factor VIII and von Willebrand, D-dimer was checked and elevated at 0.6.  Neck CT was ordered to rule out any blood clots.  Neck CT came back negative for neck mass or adenopathy.  This is likely muscle spasm that  causes pain on her neck and shoulder.  Plan: Flexeril 5 mg 3 times daily as needed is prescribed.  Light stretching exercise also advised.  Patient is recommended to follow-up with her PCP and also her neurologist for evaluation of her headache.  Come back to the ED for worsening symptoms.  Patient voiced understanding.    MDM Rules/Calculators/A&P                          Patient presents for neck pain and lumps.  Very tender to palpation and neck rotation.  Muscle hypertonicity noted on physical exam.  CMP unremarkable.  CBC consistent with history of iron deficiency anemia.  Due to history of elevated factor VIII and von Willebrand, D-dimer was checked and elevated. We order a neck CT to rule out blood clot which was negative.  This is likely muscle spasm causing her pain.  Flexeril was prescribed.  Patient is instructed to follow-up with her PCP and neurologist.  Come back to the ED for worsening symptoms.  Final Clinical Impression(s) / ED Diagnoses Final diagnoses:  Neck pain on left side  Musculoskeletal pain    Rx / DC Orders ED Discharge Orders         Ordered    cyclobenzaprine (FLEXERIL) 5 MG tablet  3 times daily PRN        12/21/19 1258           Doran Stabler, DO 12/21/19 1318    Alvira Monday, MD 12/22/19 2159

## 2019-12-21 NOTE — ED Triage Notes (Signed)
Left side lump on neck x 1 week and now it hurts into her back and shoulder  No vaccine denies cough denies hurting to swallow

## 2019-12-25 ENCOUNTER — Encounter (HOSPITAL_BASED_OUTPATIENT_CLINIC_OR_DEPARTMENT_OTHER): Payer: Self-pay | Admitting: *Deleted

## 2019-12-25 ENCOUNTER — Other Ambulatory Visit: Payer: Self-pay

## 2019-12-25 ENCOUNTER — Emergency Department (HOSPITAL_BASED_OUTPATIENT_CLINIC_OR_DEPARTMENT_OTHER)
Admission: EM | Admit: 2019-12-25 | Discharge: 2019-12-25 | Disposition: A | Discharge status: Home / Self Care | Payer: Medicaid Other | Attending: Emergency Medicine | Admitting: Emergency Medicine

## 2019-12-25 DIAGNOSIS — J45909 Unspecified asthma, uncomplicated: Secondary | ICD-10-CM | POA: Insufficient documentation

## 2019-12-25 DIAGNOSIS — R590 Localized enlarged lymph nodes: Secondary | ICD-10-CM | POA: Insufficient documentation

## 2019-12-25 DIAGNOSIS — Z79899 Other long term (current) drug therapy: Secondary | ICD-10-CM | POA: Insufficient documentation

## 2019-12-25 DIAGNOSIS — R591 Generalized enlarged lymph nodes: Secondary | ICD-10-CM

## 2019-12-25 MED ORDER — CLINDAMYCIN HCL 150 MG PO CAPS
450.0000 mg | ORAL_CAPSULE | Freq: Three times a day (TID) | ORAL | 0 refills | Status: DC
Start: 2019-12-25 — End: 2020-02-19

## 2019-12-25 NOTE — ED Triage Notes (Signed)
Co cont sore throat seen here on 10/4 for same

## 2019-12-25 NOTE — ED Provider Notes (Signed)
[facility] EMERGENCY DEPARTMENT Provider Note   CSN: [id number] Arrival date & time: 12/25/19  1947     History Chief Complaint  Patient presents with  . Sore Throat    Brianna Byrd is a 24 y.o. female.  The history is provided by the patient and medical records. No language interpreter was used.  Sore Throat   Brianna Byrd is a 24 y.o. female who presents to the Emergency Department complaining of swollen lymph nodes. She presents the emergency department complaining of swollen lymph nodes. On Monday of this week she developed some pain and swelling to her left lateral neck. She was evaluated in the emergency department on Tuesday and had a negative CT scan at that time. She states that since that time she has noticed some enlarged lymph nodes around her right ear with local tenderness as well as in her right groin. She denies any fevers, chest pain, shortness of breath, nausea, vomiting, abdominal pain, diarrhea. Symptoms are moderate and constant nature.    Past Medical History:  Diagnosis Date  . Asthma   . Obesity   . Reflux     Patient Active Problem List   Diagnosis Date Noted  . Neck pain on left side 12/21/2019  . Musculoskeletal pain 12/21/2019  . Abdominal bloating 07/30/2019  . Heartburn 07/30/2019  . Migraine without aura and without status migrainosus, not intractable 03/04/2019  . Left breast mass 01/27/2019  . History of pre-eclampsia 02/12/2017  . SVD (spontaneous vaginal delivery) 12/26/2016    Past Surgical History:  Procedure Laterality Date  . NO PAST SURGERIES    . UPPER GASTROINTESTINAL ENDOSCOPY  08/28/2019     OB History    Gravida  1   Para  1   Term  1   Preterm      AB      Living  1     SAB      TAB      Ectopic      Multiple  0   Live Births  1           Family History  Problem Relation Age of Onset  . Diabetes Father   . Hypertension Mother   . Diabetes Sister   . Colon cancer  Cousin   . Rectal cancer Cousin   . Cancer Neg Hx   . Colon polyps Neg Hx   . Esophageal cancer Neg Hx   . Stomach cancer Neg Hx     Social History   Tobacco Use  . Smoking status: Never Smoker  . Smokeless tobacco: Never Used  Vaping Use  . Vaping Use: Never used  Substance Use Topics  . Alcohol use: No  . Drug use: No    Home Medications Prior to Admission medications   Medication Sig Start Date End Date Taking? Authorizing Provider  Cholecalciferol 1.25 MG (50000 UT) capsule Frequency:weekly   Dosage:50000   UNIT  Instructions:D3-50 50000UNIT, 1 (one) Capsule Capsule weekly  Note: 06/01/13   [provider]  clindamycin (CLEOCIN) 150 MG capsule Take 3 capsules (450 mg total) by mouth 3 (three) times daily. 12/25/19   Tilden Fossa, MD  clotrimazole-betamethasone (LOTRISONE) cream Apply 1 application topically 2 (two) times daily. 09/23/19   Willodean Rosenthal, MD  cyclobenzaprine (FLEXERIL) 5 MG tablet Take 1 tablet (5 mg total) by mouth 3 (three) times daily as needed for up to 7 days for muscle spasms. 12/21/19 12/28/19  Doran Stabler, DO  esomeprazole (NEXIUM) 40  MG capsule Take 1 capsule (40 mg total) by mouth daily before breakfast. 08/28/19   Mansouraty, Netty Starring., MD  famotidine (PEPCID) 20 MG tablet Take 1 tablet (20 mg total) by mouth 2 (two) times daily. 10/24/19   Renne Crigler, PA-C  ferrous sulfate 325 (65 FE) MG tablet Take 1 tablet (325 mg total) by mouth daily. 06/18/19   Petrucelli, Samantha R, PA-C  fluconazole (DIFLUCAN) 150 MG tablet Take 1 tablet (150 mg total) by mouth every 3 (three) days. For three doses 10/07/19   Willodean Rosenthal, MD  ibuprofen (ADVIL) 800 MG tablet Take 800 mg by mouth 3 (three) times daily. 06/28/19   [provider]  Multiple Vitamin (MULTIVITAMIN) capsule Take 1 capsule by mouth daily.    [provider]  naproxen (NAPROSYN) 500 MG tablet Take 1 tablet (500 mg total) by mouth 2 (two) times daily. 06/18/19    Petrucelli, Samantha R, PA-C  NORLYDA 0.35 MG tablet  06/10/19   [provider]  sucralfate (CARAFATE) 1 g tablet Take 1 tablet (1 g total) by mouth 4 (four) times daily -  with meals and at bedtime. 10/24/19   Renne Crigler, PA-C  fluticasone (FLONASE) 50 MCG/ACT nasal spray Place 2 sprays into both nostrils daily. Patient not taking: Reported on 01/27/2019 06/05/18 05/01/19  Palumbo, April, MD  medroxyPROGESTERone (PROVERA) 10 MG tablet Take 1 tablet (10 mg total) by mouth daily for 7 days. 03/04/19 05/01/19  Sharlene Dory, DO  SUMAtriptan (IMITREX) 100 MG tablet Take 1 tablet (100 mg total) by mouth every 2 (two) hours as needed for migraine. May repeat in 2 hours if headache persists or recurs. Patient not taking: Reported on 03/27/2019 03/04/19 05/01/19  Sharlene Dory, DO    Allergies    Peach [prunus persica]  Review of Systems   Review of Systems  All other systems reviewed and are negative.   Physical Exam Updated Vital Signs BP 121/79   Pulse (!) 108   Temp 99.3 F (37.4 C)   Resp 18   Ht 5\' 2"  (1.575 m)   Wt 86.2 kg   SpO2 99%   BMI 34.75 kg/m   Physical Exam Vitals and nursing note reviewed.  Constitutional:      Appearance: She is well-developed.  HENT:     Head: Normocephalic and atraumatic.     Comments: There are multiple right periauricularr tender nodules that are approximately the size of a pea located over the mastoid and superior to the ear.  No preauricular swelling.  There is on small enlarged lymph node on the left anterior neck.  There is one pea sized tender lymph node in the right groin.      Right Ear: Tympanic membrane normal.     Left Ear: Tympanic membrane normal.     Mouth/Throat:     Mouth: Mucous membranes are moist.  Eyes:     Extraocular Movements: Extraocular movements intact.     Pupils: Pupils are equal, round, and reactive to light.  Cardiovascular:     Rate and Rhythm: Normal rate and regular rhythm.      Heart sounds: No murmur heard.   Pulmonary:     Effort: Pulmonary effort is normal. No respiratory distress.     Breath sounds: Normal breath sounds.  Abdominal:     Palpations: Abdomen is soft.     Tenderness: There is no abdominal tenderness. There is no guarding or rebound.  Musculoskeletal:  General: No tenderness.     Cervical back: Neck supple.  Skin:    General: Skin is warm and dry.  Neurological:     Mental Status: She is alert and oriented to person, place, and time.  Psychiatric:        Behavior: Behavior normal.     ED Results / Procedures / Treatments   Labs (all labs ordered are listed, but only abnormal results are displayed) Labs Reviewed - No data to display  EKG None  Radiology No results found.  Procedures Procedures (including critical care time)  Medications Ordered in ED Medications - No data to display  ED Course  I have reviewed the triage vital signs and the nursing notes.  Pertinent labs & imaging results that were available during my care of the patient were reviewed by me and considered in my medical decision making (see chart for details).    MDM Rules/Calculators/A&P                         patient here for evaluation of swollen lymph nodes that are tender. She is non-toxic appearing on evaluation and in no acute distress. Reviewed recent ED visit with labs and CT scan performed at that time. Examination today is consistent with small enlarged lymph nodes versus papules around the right ear. No evidence of acute otitis media, strep pharyngitis or peritonsilar abscess. Presentation is not consistent with malignancy. Will treat with antibiotics for possible early skin infection. Discussed unclear source of symptoms, outpatient follow-up and return precautions.  Final Clinical Impression(s) / ED Diagnoses Final diagnoses:  Lymphadenopathy of head and neck    Rx / DC Orders ED Discharge Orders         Ordered    clindamycin  (CLEOCIN) 150 MG capsule  3 times daily        12/25/19 2144           Tilden Fossa, MD 12/25/19 2148

## 2020-02-18 ENCOUNTER — Emergency Department (HOSPITAL_BASED_OUTPATIENT_CLINIC_OR_DEPARTMENT_OTHER)
Admission: EM | Admit: 2020-02-18 | Discharge: 2020-02-19 | Disposition: A | Discharge status: Home / Self Care | Payer: Medicaid Other | Attending: Emergency Medicine | Admitting: Emergency Medicine

## 2020-02-18 ENCOUNTER — Other Ambulatory Visit: Payer: Self-pay

## 2020-02-18 ENCOUNTER — Encounter (HOSPITAL_BASED_OUTPATIENT_CLINIC_OR_DEPARTMENT_OTHER): Payer: Self-pay | Admitting: Emergency Medicine

## 2020-02-18 DIAGNOSIS — N760 Acute vaginitis: Secondary | ICD-10-CM

## 2020-02-18 DIAGNOSIS — J45909 Unspecified asthma, uncomplicated: Secondary | ICD-10-CM | POA: Insufficient documentation

## 2020-02-18 DIAGNOSIS — N94819 Vulvodynia, unspecified: Secondary | ICD-10-CM | POA: Insufficient documentation

## 2020-02-18 NOTE — ED Triage Notes (Signed)
Reports vaginal irritation/burning for two days. States started using monistat and made s/s worse. Some dysuria

## 2020-02-19 LAB — URINALYSIS, ROUTINE W REFLEX MICROSCOPIC
Bilirubin Urine: NEGATIVE
Glucose, UA: NEGATIVE mg/dL
Hgb urine dipstick: NEGATIVE
Ketones, ur: NEGATIVE mg/dL
Nitrite: NEGATIVE
Protein, ur: NEGATIVE mg/dL
Specific Gravity, Urine: 1.025 (ref 1.005–1.030)
pH: 7 (ref 5.0–8.0)

## 2020-02-19 LAB — URINALYSIS, MICROSCOPIC (REFLEX): RBC / HPF: NONE SEEN RBC/hpf (ref 0–5)

## 2020-02-19 LAB — WET PREP, GENITAL
Clue Cells Wet Prep HPF POC: NONE SEEN
Sperm: NONE SEEN
Trich, Wet Prep: NONE SEEN
Yeast Wet Prep HPF POC: NONE SEEN

## 2020-02-19 LAB — PREGNANCY, URINE: Preg Test, Ur: NEGATIVE

## 2020-02-19 MED ORDER — FLUCONAZOLE 150 MG PO TABS
150.0000 mg | ORAL_TABLET | Freq: Once | ORAL | Status: AC
  Administered 2020-02-19: 150 mg via ORAL
  Filled 2020-02-19: qty 1

## 2020-02-19 NOTE — ED Provider Notes (Signed)
[facility] Provider Note: Lowella Dell, MD, FACEP  CSN: [id number] MRN: [id number] ARRIVAL: 02/18/20 at 2335 ROOM: [facility]04/[facility]04   CHIEF COMPLAINT  Vaginal Discomfort   HISTORY OF PRESENT ILLNESS  02/19/20 12:13 AM Brianna Byrd is a 24 y.o. female with 2 days of vaginal discomfort which she describes as an irritation and not so much burning.  She attributes it to using a new soap "down there".  She rates the pain as an 8 out of 10, worse with urination or wiping.  She started using Monistat-7 and this seems to have made the symptoms worse.  She only took the first dose.  She had not noticed a discharge prior to using the Monistat.   Past Medical History:  Diagnosis Date  . Asthma   . Obesity   . Reflux     Past Surgical History:  Procedure Laterality Date  . NO PAST SURGERIES    . UPPER GASTROINTESTINAL ENDOSCOPY  08/28/2019    Family History  Problem Relation Age of Onset  . Diabetes Father   . Hypertension Mother   . Diabetes Sister   . Colon cancer Cousin   . Rectal cancer Cousin   . Cancer Neg Hx   . Colon polyps Neg Hx   . Esophageal cancer Neg Hx   . Stomach cancer Neg Hx     Social History   Tobacco Use  . Smoking status: Never Smoker  . Smokeless tobacco: Never Used  Vaping Use  . Vaping Use: Never used  Substance Use Topics  . Alcohol use: No  . Drug use: No    Prior to Admission medications   Medication Sig Start Date End Date Taking? Authorizing Provider  Cholecalciferol 1.25 MG (50000 UT) capsule Frequency:weekly   Dosage:50000   UNIT  Instructions:D3-50 50000UNIT, 1 (one) Capsule Capsule weekly  Note: 06/01/13   [provider]  esomeprazole (NEXIUM) 40 MG capsule Take 1 capsule (40 mg total) by mouth daily before breakfast. 08/28/19   Mansouraty, Netty Starring., MD  famotidine (PEPCID) 20 MG tablet Take 1 tablet (20 mg total) by mouth 2 (two) times daily. 10/24/19   Renne Crigler, PA-C  ferrous sulfate 325 (65 FE) MG  tablet Take 1 tablet (325 mg total) by mouth daily. 06/18/19   Petrucelli, Samantha R, PA-C  fluconazole (DIFLUCAN) 150 MG tablet Take 1 tablet (150 mg total) by mouth every 3 (three) days. For three doses 10/07/19   Willodean Rosenthal, MD  Multiple Vitamin (MULTIVITAMIN) capsule Take 1 capsule by mouth daily.    [provider]  NORLYDA 0.35 MG tablet  06/10/19   [provider]  sucralfate (CARAFATE) 1 g tablet Take 1 tablet (1 g total) by mouth 4 (four) times daily -  with meals and at bedtime. 10/24/19   Renne Crigler, PA-C  fluticasone (FLONASE) 50 MCG/ACT nasal spray Place 2 sprays into both nostrils daily. Patient not taking: Reported on 01/27/2019 06/05/18 05/01/19  Palumbo, April, MD  medroxyPROGESTERone (PROVERA) 10 MG tablet Take 1 tablet (10 mg total) by mouth daily for 7 days. 03/04/19 05/01/19  Sharlene Dory, DO  SUMAtriptan (IMITREX) 100 MG tablet Take 1 tablet (100 mg total) by mouth every 2 (two) hours as needed for migraine. May repeat in 2 hours if headache persists or recurs. Patient not taking: Reported on 03/27/2019 03/04/19 05/01/19  Sharlene Dory, DO    Allergies Peach [prunus persica]   REVIEW OF SYSTEMS  Negative except as noted here or in  the History of Present Illness.   PHYSICAL EXAMINATION  Initial Vital Signs Blood pressure 113/82, pulse 89, temperature 98.6 F (37 C), temperature source Oral, resp. rate 16, last menstrual period 02/01/2020, SpO2 100 %.  Examination General: Well-developed, well-nourished female in no acute distress; appearance consistent with age of record HENT: normocephalic; atraumatic Eyes: Normal appearance Neck: supple Heart: regular rate and rhythm Lungs: clear to auscultation bilaterally Abdomen: soft; nondistended; nontender; bowel sounds present GU: Vulvovaginal erythema; white vaginal discharge; tenderness of vulvovaginal mucosae Extremities: No deformity; full range of motion; pulses  normal Neurologic: Awake, alert and oriented; motor function intact in all extremities and symmetric; no facial droop Skin: Warm and dry Psychiatric: Normal mood and affect   RESULTS  Summary of this visit's results, reviewed and interpreted by myself:   EKG Interpretation  Date/Time:    Ventricular Rate:    PR Interval:    QRS Duration:   QT Interval:    QTC Calculation:   R Axis:     Text Interpretation:        Laboratory Studies: Results for orders placed or performed during the hospital encounter of 02/18/20 (from the past 24 hour(s))  Urinalysis, Routine w reflex microscopic Urine, Clean Catch     Status: Abnormal   Collection Time: 02/19/20 12:09 AM  Result Value Ref Range   Color, Urine YELLOW YELLOW   APPearance HAZY (A) CLEAR   Specific Gravity, Urine 1.025 1.005 - 1.030   pH 7.0 5.0 - 8.0   Glucose, UA NEGATIVE NEGATIVE mg/dL   Hgb urine dipstick NEGATIVE NEGATIVE   Bilirubin Urine NEGATIVE NEGATIVE   Ketones, ur NEGATIVE NEGATIVE mg/dL   Protein, ur NEGATIVE NEGATIVE mg/dL   Nitrite NEGATIVE NEGATIVE   Leukocytes,Ua TRACE (A) NEGATIVE  Pregnancy, urine     Status: None   Collection Time: 02/19/20 12:09 AM  Result Value Ref Range   Preg Test, Ur NEGATIVE NEGATIVE  Urinalysis, Microscopic (reflex)     Status: Abnormal   Collection Time: 02/19/20 12:09 AM  Result Value Ref Range   RBC / HPF NONE SEEN 0 - 5 RBC/hpf   WBC, UA 0-5 0 - 5 WBC/hpf   Bacteria, UA RARE (A) NONE SEEN   Squamous Epithelial / LPF 6-10 0 - 5   Mucus PRESENT   Wet prep, genital     Status: Abnormal   Collection Time: 02/19/20 12:24 AM  Result Value Ref Range   Yeast Wet Prep HPF POC NONE SEEN NONE SEEN   Trich, Wet Prep NONE SEEN NONE SEEN   Clue Cells Wet Prep HPF POC NONE SEEN NONE SEEN   WBC, Wet Prep HPF POC MANY (A) NONE SEEN   Sperm NONE SEEN    Imaging Studies: No results found.  ED COURSE and MDM  Nursing notes, initial and subsequent vitals signs, including pulse  oximetry, reviewed and interpreted by myself.  Vitals:   02/18/20 2342  BP: 113/82  Pulse: 89  Resp: 16  Temp: 98.6 F (37 C)  TempSrc: Oral  SpO2: 100%   Medications  fluconazole (DIFLUCAN) tablet 150 mg (has no administration in time range)    The cause of the patient's vaginal discomfort is unclear.  It could be an adverse reaction to a new soap.  She is negative for yeast, trichomoniasis and BV on wet prep but wet preps are not 100% sensitive for yeast.  We will go ahead and treat with Diflucan.  Gonorrhea and Chlamydia testing are pending.   PROCEDURES  Procedures   ED DIAGNOSES     ICD-10-CM   1. Vulvovaginitis  N76.0        Kais Monje, MD 02/19/20 [phone]

## 2020-02-22 LAB — GC/CHLAMYDIA PROBE AMP (~~LOC~~) NOT AT ARMC
Chlamydia: NEGATIVE
Comment: NEGATIVE
Comment: NORMAL
Neisseria Gonorrhea: NEGATIVE

## 2020-03-23 ENCOUNTER — Emergency Department (HOSPITAL_BASED_OUTPATIENT_CLINIC_OR_DEPARTMENT_OTHER)
Admission: EM | Admit: 2020-03-23 | Discharge: 2020-03-23 | Disposition: A | Discharge status: Home / Self Care | Payer: Medicaid Other

## 2020-03-30 ENCOUNTER — Encounter (HOSPITAL_BASED_OUTPATIENT_CLINIC_OR_DEPARTMENT_OTHER): Payer: Self-pay | Admitting: Emergency Medicine

## 2020-03-30 ENCOUNTER — Other Ambulatory Visit: Payer: Self-pay

## 2020-03-30 ENCOUNTER — Emergency Department (HOSPITAL_BASED_OUTPATIENT_CLINIC_OR_DEPARTMENT_OTHER)
Admission: EM | Admit: 2020-03-30 | Discharge: 2020-03-30 | Disposition: A | Discharge status: Home / Self Care | Payer: Medicaid Other | Attending: Emergency Medicine | Admitting: Emergency Medicine

## 2020-03-30 DIAGNOSIS — J029 Acute pharyngitis, unspecified: Secondary | ICD-10-CM | POA: Insufficient documentation

## 2020-03-30 DIAGNOSIS — R8290 Unspecified abnormal findings in urine: Secondary | ICD-10-CM

## 2020-03-30 DIAGNOSIS — Z79899 Other long term (current) drug therapy: Secondary | ICD-10-CM | POA: Insufficient documentation

## 2020-03-30 DIAGNOSIS — J45909 Unspecified asthma, uncomplicated: Secondary | ICD-10-CM | POA: Insufficient documentation

## 2020-03-30 DIAGNOSIS — R82998 Other abnormal findings in urine: Secondary | ICD-10-CM | POA: Insufficient documentation

## 2020-03-30 LAB — URINALYSIS, ROUTINE W REFLEX MICROSCOPIC
Bilirubin Urine: NEGATIVE
Glucose, UA: NEGATIVE mg/dL
Hgb urine dipstick: NEGATIVE
Ketones, ur: NEGATIVE mg/dL
Leukocytes,Ua: NEGATIVE
Nitrite: NEGATIVE
Protein, ur: NEGATIVE mg/dL
Specific Gravity, Urine: 1.03 (ref 1.005–1.030)
pH: 6 (ref 5.0–8.0)

## 2020-03-30 LAB — PREGNANCY, URINE: Preg Test, Ur: NEGATIVE

## 2020-03-30 MED ORDER — PHENAZOPYRIDINE HCL 95 MG PO TABS
95.0000 mg | ORAL_TABLET | Freq: Three times a day (TID) | ORAL | 0 refills | Status: DC | PRN
Start: 2020-03-30 — End: 2020-06-10

## 2020-03-30 NOTE — ED Provider Notes (Signed)
[facility] EMERGENCY DEPARTMENT Provider Note   CSN: [id number] Arrival date & time: 03/30/20  0800     History Chief Complaint  Patient presents with  . Urinary Tract Infection    Brianna Byrd is a 25 y.o. female.  HPI Patient is a 25 year old female presented today with concerns that her urine is infected. She states that she has had somewhat more strong smelling urine over the past 2 days. She states it has also been somewhat darker in color. She denies any frequency, urgency or dysuria. She denies abdominal pain chills fevers or back pain. No other associated symptoms. No aggravating relieving factors.  Patient states she also would like to have a vaccine exemption for her second COVID shot. She is newly employed at [employer]. She was told that she needed to come to the ER for vaccine exemption form be filled out. She states that she had her first vaccine shot on December 18, 2019. And had a skin infection on her face but developed 4 to 6 days later.  She states that when she had a shot she had no shortness of breath, swelling, redness. She did have a sore throat.     Past Medical History:  Diagnosis Date  . Asthma   . Obesity   . Reflux     Patient Active Problem List   Diagnosis Date Noted  . Neck pain on left side 12/21/2019  . Musculoskeletal pain 12/21/2019  . Abdominal bloating 07/30/2019  . Heartburn 07/30/2019  . Migraine without aura and without status migrainosus, not intractable 03/04/2019  . Left breast mass 01/27/2019  . History of pre-eclampsia 02/12/2017  . SVD (spontaneous vaginal delivery) 12/26/2016    Past Surgical History:  Procedure Laterality Date  . NO PAST SURGERIES    . UPPER GASTROINTESTINAL ENDOSCOPY  08/28/2019     OB History    Gravida  1   Para  1   Term  1   Preterm      AB      Living  1     SAB      IAB      Ectopic      Multiple  0   Live Births  1           Family History  Problem  Relation Age of Onset  . Diabetes Father   . Hypertension Mother   . Diabetes Sister   . Colon cancer Cousin   . Rectal cancer Cousin   . Cancer Neg Hx   . Colon polyps Neg Hx   . Esophageal cancer Neg Hx   . Stomach cancer Neg Hx     Social History   Tobacco Use  . Smoking status: Never Smoker  . Smokeless tobacco: Never Used  Vaping Use  . Vaping Use: Never used  Substance Use Topics  . Alcohol use: No  . Drug use: No    Home Medications Prior to Admission medications   Medication Sig Start Date End Date Taking? Authorizing Provider  phenazopyridine (AZO URINARY PAIN RELIEF) 95 MG tablet Take 1 tablet (95 mg total) by mouth 3 (three) times daily as needed for pain. 03/30/20  Yes Gailen Shelter, PA  Cholecalciferol 1.25 MG (50000 UT) capsule Frequency:weekly   Dosage:50000   UNIT  Instructions:D3-50 50000UNIT, 1 (one) Capsule Capsule weekly  Note: 06/01/13   [provider]  esomeprazole (NEXIUM) 40 MG capsule Take 1 capsule (40 mg total) by mouth daily before breakfast.  08/28/19   Mansouraty, Netty Starring., MD  famotidine (PEPCID) 20 MG tablet Take 1 tablet (20 mg total) by mouth 2 (two) times daily. 10/24/19   Renne Crigler, PA-C  ferrous sulfate 325 (65 FE) MG tablet Take 1 tablet (325 mg total) by mouth daily. 06/18/19   Petrucelli, Samantha R, PA-C  fluconazole (DIFLUCAN) 150 MG tablet Take 1 tablet (150 mg total) by mouth every 3 (three) days. For three doses 10/07/19   Willodean Rosenthal, MD  Multiple Vitamin (MULTIVITAMIN) capsule Take 1 capsule by mouth daily.    [provider]  NORLYDA 0.35 MG tablet  06/10/19   [provider]  sucralfate (CARAFATE) 1 g tablet Take 1 tablet (1 g total) by mouth 4 (four) times daily -  with meals and at bedtime. 10/24/19   Renne Crigler, PA-C  fluticasone (FLONASE) 50 MCG/ACT nasal spray Place 2 sprays into both nostrils daily. Patient not taking: Reported on 01/27/2019 06/05/18 05/01/19  Palumbo, April, MD   medroxyPROGESTERone (PROVERA) 10 MG tablet Take 1 tablet (10 mg total) by mouth daily for 7 days. 03/04/19 05/01/19  Sharlene Dory, DO  SUMAtriptan (IMITREX) 100 MG tablet Take 1 tablet (100 mg total) by mouth every 2 (two) hours as needed for migraine. May repeat in 2 hours if headache persists or recurs. Patient not taking: Reported on 03/27/2019 03/04/19 05/01/19  Sharlene Dory, DO    Allergies    Peach [prunus persica]  Review of Systems   Review of Systems  Constitutional: Negative for fever.  HENT: Negative for congestion.   Respiratory: Negative for shortness of breath.   Cardiovascular: Negative for chest pain.  Gastrointestinal: Negative for abdominal distention, abdominal pain, diarrhea and nausea.  Genitourinary: Negative for decreased urine volume, dysuria, flank pain, frequency, genital sores, hematuria, menstrual problem, urgency, vaginal bleeding and vaginal discharge.       Dark, strong smelling urine  Neurological: Negative for dizziness and headaches.    Physical Exam Updated Vital Signs BP 115/62 (BP Location: Left Arm)   Pulse 89   Temp 98.3 F (36.8 C) (Oral)   Resp 18   Ht 5\' 1"  (1.549 m)   Wt 87.1 kg   SpO2 100%   BMI 36.28 kg/m   Physical Exam Vitals and nursing note reviewed.  Constitutional:      General: She is not in acute distress.    Appearance: Normal appearance. She is not ill-appearing.  HENT:     Head: Normocephalic and atraumatic.  Eyes:     General: No scleral icterus.       Right eye: No discharge.        Left eye: No discharge.     Conjunctiva/sclera: Conjunctivae normal.  Pulmonary:     Effort: Pulmonary effort is normal.     Breath sounds: No stridor.  Abdominal:     Comments: Abdomen is soft nontender no guarding or rebound. No CVA tenderness.  Neurological:     Mental Status: She is alert and oriented to person, place, and time. Mental status is at baseline.     ED Results / Procedures / Treatments    Labs (all labs ordered are listed, but only abnormal results are displayed) Labs Reviewed  URINALYSIS, ROUTINE W REFLEX MICROSCOPIC  PREGNANCY, URINE    EKG None  Radiology No results found.  Procedures Procedures (including critical care time)  Medications Ordered in ED Medications - No data to display  ED Course  I have reviewed the triage vital signs  and the nursing notes.  Pertinent labs & imaging results that were available during my care of the patient were reviewed by me and considered in my medical decision making (see chart for details).    MDM Rules/Calculators/A&P                          Patient is a 25 year old female with history of obesity and asthma presenting today with 2 complaints. Primarily she is asking that a COVID vaccine exemption form. This is addressed below. She is also presented today with "strong smelling urine" she denies any dysuria frequency urgency or hematuria. She denies any abdominal pain nausea vomiting fevers. Urinalysis is without any evidence of infection. She states that she is not concerned about pregnancy.  Physical exam is unremarkable.  I do not believe the patient has UTI. She will follow-up with her primary care doctor. Says she does not currently have one she will follow-up with the [facility]. She was given return precautions.  She states that on December 25, 2019 she was diagnosed with a skin infection of her face that had begun 6 days after her vaccine. She was afraid that this is related. I reviewed patient's note she was seen by Dr. Pecola Leisure at that time. There is no mention of COVID-vaccine and I have low suspicion that this was related/causative of her skin infection.  I encouraged patient to go ahead and receive her second vaccine.  Azo provided to pt per her request although she has no urinary symptoms.  Final Clinical Impression(s) / ED Diagnoses Final diagnoses:  Abnormal urine odor    Rx / DC  Orders ED Discharge Orders         Ordered    phenazopyridine (AZO URINARY PAIN RELIEF) 95 MG tablet  3 times daily PRN        03/30/20 1022           Solon Augusta Frisbee, [place] 03/30/20 1028    Terald Sleeper, MD 03/30/20 1726

## 2020-03-30 NOTE — Discharge Instructions (Signed)
Please follow-[facility] with the  [facility]. This will provide you with a primary care doctor to follow-up with. As we discussed I do recommend getting the second COVID-vaccine. Your urinalysis today was not consistent with infection and, as we discussed, strong odor is not necessarily a reliable indication of infection. Please drink plenty of water.

## 2020-03-30 NOTE — ED Triage Notes (Signed)
Reports urinary frequency, urgency, and burning for the last week.  Also needs a covid vaccine exemption due to reaction to first vaccine.

## 2020-05-18 ENCOUNTER — Encounter: Payer: Self-pay | Admitting: Emergency Medicine

## 2020-05-18 ENCOUNTER — Emergency Department
Admission: EM | Admit: 2020-05-18 | Discharge: 2020-05-18 | Disposition: A | Discharge status: Home / Self Care | Payer: Medicaid Other | Attending: Student in an Organized Health Care Education/Training Program | Admitting: Student in an Organized Health Care Education/Training Program

## 2020-05-18 ENCOUNTER — Other Ambulatory Visit: Payer: Self-pay

## 2020-05-18 DIAGNOSIS — R1084 Generalized abdominal pain: Secondary | ICD-10-CM | POA: Insufficient documentation

## 2020-05-18 DIAGNOSIS — J45909 Unspecified asthma, uncomplicated: Secondary | ICD-10-CM | POA: Insufficient documentation

## 2020-05-18 DIAGNOSIS — R300 Dysuria: Secondary | ICD-10-CM | POA: Insufficient documentation

## 2020-05-18 LAB — CHLAMYDIA/NGC RT PCR (ARMC ONLY)
Chlamydia Tr: DETECTED — AB
N gonorrhoeae: NOT DETECTED

## 2020-05-18 LAB — URINALYSIS, COMPLETE (UACMP) WITH MICROSCOPIC
Bilirubin Urine: NEGATIVE
Glucose, UA: NEGATIVE mg/dL
Hgb urine dipstick: NEGATIVE
Ketones, ur: NEGATIVE mg/dL
Nitrite: NEGATIVE
Protein, ur: NEGATIVE mg/dL
Specific Gravity, Urine: 1.026 (ref 1.005–1.030)
pH: 6 (ref 5.0–8.0)

## 2020-05-18 LAB — CBC
HCT: 33.3 % — ABNORMAL LOW (ref 36.0–46.0)
Hemoglobin: 10.2 g/dL — ABNORMAL LOW (ref 12.0–15.0)
MCH: 24.4 pg — ABNORMAL LOW (ref 26.0–34.0)
MCHC: 30.6 g/dL (ref 30.0–36.0)
MCV: 79.7 fL — ABNORMAL LOW (ref 80.0–100.0)
Platelets: 369 10*3/uL (ref 150–400)
RBC: 4.18 MIL/uL (ref 3.87–5.11)
RDW: 16.4 % — ABNORMAL HIGH (ref 11.5–15.5)
WBC: 6.6 10*3/uL (ref 4.0–10.5)
nRBC: 0 % (ref 0.0–0.2)

## 2020-05-18 LAB — COMPREHENSIVE METABOLIC PANEL
ALT: 9 U/L (ref 0–44)
AST: 16 U/L (ref 15–41)
Albumin: 3.7 g/dL (ref 3.5–5.0)
Alkaline Phosphatase: 67 U/L (ref 38–126)
Anion gap: 7 (ref 5–15)
BUN: 5 mg/dL — ABNORMAL LOW (ref 6–20)
CO2: 24 mmol/L (ref 22–32)
Calcium: 8.6 mg/dL — ABNORMAL LOW (ref 8.9–10.3)
Chloride: 107 mmol/L (ref 98–111)
Creatinine, Ser: 0.68 mg/dL (ref 0.44–1.00)
GFR, Estimated: >60 mL/min (ref >60)
Glucose, Bld: 110 mg/dL — ABNORMAL HIGH (ref 70–99)
Potassium: 3.7 mmol/L (ref 3.5–5.1)
Sodium: 138 mmol/L (ref 135–145)
Total Bilirubin: 0.5 mg/dL (ref 0.3–1.2)
Total Protein: 7.4 g/dL (ref 6.5–8.1)

## 2020-05-18 LAB — WET PREP, GENITAL
Clue Cells Wet Prep HPF POC: NONE SEEN
Sperm: NONE SEEN
Trich, Wet Prep: NONE SEEN

## 2020-05-18 LAB — LIPASE, BLOOD: Lipase: 33 U/L (ref 11–51)

## 2020-05-18 LAB — POC URINE PREG, ED: Preg Test, Ur: NEGATIVE

## 2020-05-18 MED ORDER — CEPHALEXIN 500 MG PO CAPS: 500.0000 mg | ORAL_CAPSULE | Freq: Three times a day (TID) | ORAL | 0 refills | Status: AC

## 2020-05-18 MED ORDER — PANTOPRAZOLE SODIUM 20 MG PO TBEC: 20.0000 mg | DELAYED_RELEASE_TABLET | Freq: Every day | ORAL | 0 refills | Status: DC

## 2020-05-18 NOTE — ED Provider Notes (Signed)
Blood work came back chlamydia positive therefore antibiotic changed from Keflex to doxycycline.  Rx called into pharmacy.   Willy Eddy, MD 05/18/20 1524

## 2020-05-18 NOTE — ED Triage Notes (Signed)
Pt to ED via POV with c/o ongoing abd pain, states has a stomach ulcer, pt states now also has lower back pain at this time. Pt A&O x4, visualized in NAD at this time. Pt playing on cell phone while in triage. Pt states feels weak due to only eating mashed potatoes and "sides" and not eating meat x 2 days.

## 2020-05-18 NOTE — ED Notes (Signed)
PT REQUESTING TO BE TESTING FOR STD'S NOW D/T "BURNING WITH URINATION". PT C/O B/L LOWER BACK PAIN AND HAS HAD AN "ULCER FOR A YEAR".

## 2020-05-18 NOTE — ED Provider Notes (Signed)
[facility] Emergency Department Provider Note    Event Date/Time   First MD Initiated Contact with Patient 05/18/20 1137     (approximate)  I have reviewed the triage vital signs and the nursing notes.   HISTORY  Chief Complaint Abdominal Pain and Back Pain    HPI Brianna Byrd is a 25 y.o. female below listed past medical history presents to the ER for evaluation of several days weeks of generalized epigastric discomfort as well as frequent belching.  States that becoming more often was previously on antiacid medication treatment for acid reflux followed up with GI.  Not currently on antiacid medication.  Also complaining of some dysuria.  Not having vaginal discharge.  Would like to be tested for STDs.  Denies any pain or discomfort at this time.    Past Medical History:  Diagnosis Date  . Asthma   . Obesity   . Reflux    Family History  Problem Relation Age of Onset  . Diabetes Father   . Hypertension Mother   . Diabetes Sister   . Colon cancer Cousin   . Rectal cancer Cousin   . Cancer Neg Hx   . Colon polyps Neg Hx   . Esophageal cancer Neg Hx   . Stomach cancer Neg Hx    Past Surgical History:  Procedure Laterality Date  . NO PAST SURGERIES    . UPPER GASTROINTESTINAL ENDOSCOPY  08/28/2019   Patient Active Problem List   Diagnosis Date Noted  . Neck pain on left side 12/21/2019  . Musculoskeletal pain 12/21/2019  . Abdominal bloating 07/30/2019  . Heartburn 07/30/2019  . Migraine without aura and without status migrainosus, not intractable 03/04/2019  . Left breast mass 01/27/2019  . History of pre-eclampsia 02/12/2017  . SVD (spontaneous vaginal delivery) 12/26/2016      Prior to Admission medications   Medication Sig Start Date End Date Taking? Authorizing Provider  cephALEXin (KEFLEX) 500 MG capsule Take 1 capsule (500 mg total) by mouth 3 (three) times daily for 5 days. 05/18/20 05/23/20 Yes Willy Eddy, MD   pantoprazole (PROTONIX) 20 MG tablet Take 1 tablet (20 mg total) by mouth daily. 05/18/20 05/18/21 Yes Willy Eddy, MD  Cholecalciferol 1.25 MG (50000 UT) capsule Frequency:weekly   Dosage:50000   UNIT  Instructions:D3-50 50000UNIT, 1 (one) Capsule Capsule weekly  Note: 06/01/13   [provider]  ferrous sulfate 325 (65 FE) MG tablet Take 1 tablet (325 mg total) by mouth daily. 06/18/19   Petrucelli, Samantha R, PA-C  fluconazole (DIFLUCAN) 150 MG tablet Take 1 tablet (150 mg total) by mouth every 3 (three) days. For three doses 10/07/19   Willodean Rosenthal, MD  Multiple Vitamin (MULTIVITAMIN) capsule Take 1 capsule by mouth daily.    [provider]  NORLYDA 0.35 MG tablet  06/10/19   [provider]  phenazopyridine (AZO URINARY PAIN RELIEF) 95 MG tablet Take 1 tablet (95 mg total) by mouth 3 (three) times daily as needed for pain. 03/30/20   Gailen Shelter, PA  sucralfate (CARAFATE) 1 g tablet Take 1 tablet (1 g total) by mouth 4 (four) times daily -  with meals and at bedtime. 10/24/19   Renne Crigler, PA-C  esomeprazole (NEXIUM) 40 MG capsule Take 1 capsule (40 mg total) by mouth daily before breakfast. 08/28/19 05/18/20  Mansouraty, Netty Starring., MD  famotidine (PEPCID) 20 MG tablet Take 1 tablet (20 mg total) by mouth 2 (two) times daily. 10/24/19 05/18/20  Geiple,  Ivin Booty, PA-C  fluticasone (FLONASE) 50 MCG/ACT nasal spray Place 2 sprays into both nostrils daily. Patient not taking: Reported on 01/27/2019 06/05/18 05/01/19  Palumbo, April, MD  medroxyPROGESTERone (PROVERA) 10 MG tablet Take 1 tablet (10 mg total) by mouth daily for 7 days. 03/04/19 05/01/19  Sharlene Dory, DO  SUMAtriptan (IMITREX) 100 MG tablet Take 1 tablet (100 mg total) by mouth every 2 (two) hours as needed for migraine. May repeat in 2 hours if headache persists or recurs. Patient not taking: Reported on 03/27/2019 03/04/19 05/01/19  Sharlene Dory, DO    Allergies Peach [prunus  persica]    Social History Social History   Tobacco Use  . Smoking status: Never Smoker  . Smokeless tobacco: Never Used  Vaping Use  . Vaping Use: Never used  Substance Use Topics  . Alcohol use: No  . Drug use: No    Review of Systems Patient denies headaches, rhinorrhea, blurry vision, numbness, shortness of breath, chest pain, edema, cough, abdominal pain, nausea, vomiting, diarrhea, dysuria, fevers, rashes or hallucinations unless otherwise stated above in HPI. ____________________________________________   PHYSICAL EXAM:  VITAL SIGNS: Vitals:   05/18/20 1029  BP: 124/83  Pulse: 89  Resp: 20  Temp: 98.3 F (36.8 C)  SpO2: 99%    Constitutional: Alert and oriented.  Eyes: Conjunctivae are normal.  Head: Atraumatic. Nose: No congestion/rhinnorhea. Mouth/Throat: Mucous membranes are moist.   Neck: No stridor. Painless ROM.  Cardiovascular: Normal rate, regular rhythm. Grossly normal heart sounds.  Good peripheral circulation. Respiratory: Normal respiratory effort.  No retractions. Lungs CTAB. Gastrointestinal: Soft and nontender. No distention. No abdominal bruits. No CVA tenderness. Genitourinary: deferred Musculoskeletal: No lower extremity tenderness nor edema.  No joint effusions. Neurologic:  Normal speech and language. No gross focal neurologic deficits are appreciated. No facial droop Skin:  Skin is warm, dry and intact. No rash noted. Psychiatric: Mood and affect are normal. Speech and behavior are normal.  ____________________________________________   LABS (all labs ordered are listed, but only abnormal results are displayed)  Results for orders placed or performed during the hospital encounter of 05/18/20 (from the past 24 hour(s))  Lipase, blood     Status: None   Collection Time: 05/18/20 10:32 AM  Result Value Ref Range   Lipase 33 11 - 51 U/L  Comprehensive metabolic panel     Status: Abnormal   Collection Time: 05/18/20 10:32 AM  Result  Value Ref Range   Sodium 138 135 - 145 mmol/L   Potassium 3.7 3.5 - 5.1 mmol/L   Chloride 107 98 - 111 mmol/L   CO2 24 22 - 32 mmol/L   Glucose, Bld 110 (H) 70 - 99 mg/dL   BUN 5 (L) 6 - 20 mg/dL   Creatinine, Ser 1.93 0.44 - 1.00 mg/dL   Calcium 8.6 (L) 8.9 - 10.3 mg/dL   Total Protein 7.4 6.5 - 8.1 g/dL   Albumin 3.7 3.5 - 5.0 g/dL   AST 16 15 - 41 U/L   ALT 9 0 - 44 U/L   Alkaline Phosphatase 67 38 - 126 U/L   Total Bilirubin 0.5 0.3 - 1.2 mg/dL   GFR, Estimated >79 >02 mL/min   Anion gap 7 5 - 15  CBC     Status: Abnormal   Collection Time: 05/18/20 10:32 AM  Result Value Ref Range   WBC 6.6 4.0 - 10.5 K/uL   RBC 4.18 3.87 - 5.11 MIL/uL   Hemoglobin 10.2 (L) 12.0 - 15.0  g/dL   HCT 17.6 (L) 16.0 - 73.7 %   MCV 79.7 (L) 80.0 - 100.0 fL   MCH 24.4 (L) 26.0 - 34.0 pg   MCHC 30.6 30.0 - 36.0 g/dL   RDW 10.6 (H) 26.9 - 48.5 %   Platelets 369 150 - 400 K/uL   nRBC 0.0 0.0 - 0.2 %  Urinalysis, Complete w Microscopic Urine, Clean Catch     Status: Abnormal   Collection Time: 05/18/20 10:32 AM  Result Value Ref Range   Color, Urine YELLOW (A) YELLOW   APPearance HAZY (A) CLEAR   Specific Gravity, Urine 1.026 1.005 - 1.030   pH 6.0 5.0 - 8.0   Glucose, UA NEGATIVE NEGATIVE mg/dL   Hgb urine dipstick NEGATIVE NEGATIVE   Bilirubin Urine NEGATIVE NEGATIVE   Ketones, ur NEGATIVE NEGATIVE mg/dL   Protein, ur NEGATIVE NEGATIVE mg/dL   Nitrite NEGATIVE NEGATIVE   Leukocytes,Ua TRACE (A) NEGATIVE   RBC / HPF 0-5 0 - 5 RBC/hpf   WBC, UA 6-10 0 - 5 WBC/hpf   Bacteria, UA RARE (A) NONE SEEN   Squamous Epithelial / LPF 6-10 0 - 5   Mucus PRESENT   POC urine preg, ED     Status: None   Collection Time: 05/18/20 10:38 AM  Result Value Ref Range   Preg Test, Ur NEGATIVE NEGATIVE   ____________________________________________  EKG____________________________________________  RADIOLOGY   ____________________________________________   PROCEDURES  Procedure(s) performed:   Procedures    Critical Care performed: no ____________________________________________   INITIAL IMPRESSION / ASSESSMENT AND PLAN / ED COURSE  Pertinent labs & imaging results that were available during my care of the patient were reviewed by me and considered in my medical decision making (see chart for details).   DDX: Gastritis, PUD, reflux, enteritis, UTI, Pyelo, PID, STI  Azlyn Potenza is a 25 y.o. who presents to the ED with presentation as described above.  Patient exceedingly well-appearing in no acute distress.  Blood work is reassuring.  Her abdominal exam is soft and benign.  This not consistent with PID or acute intra-abdominal process.  She is passing gas moving her bowels that is not consistent with SBO.  Do not feel that further diagnostic imaging is clinically indicated with this presentation.  She is describing some dysuria and does have rare bacteria given her symptoms will cover for UTI.  Based on her presenting complaints we will start her on increased PPI and encouraged outpatient follow-up.  We discussed signs and symptoms for which she should return to the ER.  Patient agreeable to plan.     The patient was evaluated in Emergency Department today for the symptoms described in the history of present illness. He/she was evaluated in the context of the global COVID-19 pandemic, which necessitated consideration that the patient might be at risk for infection with the SARS-CoV-2 virus that causes COVID-19. Institutional protocols and algorithms that pertain to the evaluation of patients at risk for COVID-19 are in a state of rapid change based on information released by regulatory bodies including the CDC and federal and state organizations. These policies and algorithms were followed during the patient's care in the ED.  As part of my medical decision making, I reviewed the following data within the electronic MEDICAL RECORD NUMBER Nursing notes reviewed and incorporated,  Labs reviewed, notes from prior ED visits and [place] Controlled Substance Database   ____________________________________________   FINAL CLINICAL IMPRESSION(S) / ED DIAGNOSES  Final diagnoses:  Generalized abdominal pain  Dysuria      NEW MEDICATIONS STARTED DURING THIS VISIT:  New Prescriptions   CEPHALEXIN (KEFLEX) 500 MG CAPSULE    Take 1 capsule (500 mg total) by mouth 3 (three) times daily for 5 days.   PANTOPRAZOLE (PROTONIX) 20 MG TABLET    Take 1 tablet (20 mg total) by mouth daily.     Note:  This document was prepared using Dragon voice recognition software and may include unintentional dictation errors.    Willy Eddyobinson, Traniece Boffa, MD 05/18/20 [phone]

## 2020-05-18 NOTE — Discharge Instructions (Signed)

## 2020-06-10 ENCOUNTER — Encounter: Payer: Self-pay | Admitting: Family Medicine

## 2020-06-10 ENCOUNTER — Other Ambulatory Visit (HOSPITAL_COMMUNITY)
Admission: RE | Admit: 2020-06-10 | Discharge: 2020-06-10 | Disposition: A | Discharge status: Home / Self Care | Payer: No Typology Code available for payment source | Source: Ambulatory Visit | Attending: Family Medicine | Admitting: Family Medicine

## 2020-06-10 ENCOUNTER — Ambulatory Visit (INDEPENDENT_AMBULATORY_CARE_PROVIDER_SITE_OTHER): Payer: No Typology Code available for payment source | Admitting: Family Medicine

## 2020-06-10 ENCOUNTER — Other Ambulatory Visit: Payer: Self-pay

## 2020-06-10 VITALS — BP 109/78 | HR 78 | Ht 61.0 in | Wt 191.0 lb

## 2020-06-10 DIAGNOSIS — Z01419 Encounter for gynecological examination (general) (routine) without abnormal findings: Secondary | ICD-10-CM | POA: Insufficient documentation

## 2020-06-10 DIAGNOSIS — Z202 Contact with and (suspected) exposure to infections with a predominantly sexual mode of transmission: Secondary | ICD-10-CM

## 2020-06-10 NOTE — Progress Notes (Signed)
GYNECOLOGY ANNUAL PREVENTATIVE CARE ENCOUNTER NOTE  Subjective:   Brianna Byrd is a 25 y.o. [id number] female here for a routine annual gynecologic exam.  Current complaints: recent exposure to chlamydia a month ago. Took antibiotics. No longer with that partner.   Denies abnormal vaginal bleeding, discharge, pelvic pain, problems with intercourse or other gynecologic concerns.    Periods monthly - 28-30 day interval. Normal bleeding.  Gynecologic History Patient's last menstrual period was 06/02/2020. Patient is not currently sexually active  Contraception: none Last Pap: unsure. Results were: normal Last mammogram: n/a.  Obstetric History OB History  Gravida Para Term Preterm AB Living  1 1 1     1   SAB IAB Ectopic Multiple Live Births        0 1    # Outcome Date GA Lbr Len/2nd Weight Sex Delivery Anes PTL Lv  1 Term 12/26/16 [redacted]w[redacted]d 16:30 / 00:06 5 lb 5.5 oz (2.425 kg) F Vag-Spont None  LIV    Past Medical History:  Diagnosis Date  . Asthma   . Obesity   . Reflux     Past Surgical History:  Procedure Laterality Date  . NO PAST SURGERIES    . UPPER GASTROINTESTINAL ENDOSCOPY  08/28/2019    Current Outpatient Medications on File Prior to Visit  Medication Sig Dispense Refill  . [DISCONTINUED] esomeprazole (NEXIUM) 40 MG capsule Take 1 capsule (40 mg total) by mouth daily before breakfast. 30 capsule 2  . [DISCONTINUED] famotidine (PEPCID) 20 MG tablet Take 1 tablet (20 mg total) by mouth 2 (two) times daily. 30 tablet 0  . [DISCONTINUED] fluticasone (FLONASE) 50 MCG/ACT nasal spray Place 2 sprays into both nostrils daily. (Patient not taking: Reported on 01/27/2019) 16 g 0  . [DISCONTINUED] medroxyPROGESTERone (PROVERA) 10 MG tablet Take 1 tablet (10 mg total) by mouth daily for 7 days. 7 tablet 0  . [DISCONTINUED] SUMAtriptan (IMITREX) 100 MG tablet Take 1 tablet (100 mg total) by mouth every 2 (two) hours as needed for migraine. May repeat in 2 hours if headache  persists or recurs. (Patient not taking: Reported on 03/27/2019) 10 tablet 0   No current facility-administered medications on file prior to visit.    Allergies  Allergen Reactions  . Peach [Prunus Persica] Swelling    Social History   Socioeconomic History  . Marital status: Single    Spouse name: Not on file  . Number of children: Not on file  . Years of education: Not on file  . Highest education level: Not on file  Occupational History  . Not on file  Tobacco Use  . Smoking status: Never Smoker  . Smokeless tobacco: Never Used  Vaping Use  . Vaping Use: Never used  Substance and Sexual Activity  . Alcohol use: No  . Drug use: No  . Sexual activity: Yes    Birth control/protection: Condom, Pill  Other Topics Concern  . Not on file  Social History Narrative  . Not on file   Social Determinants of Health   Financial Resource Strain: Not on file  Food Insecurity: Not on file  Transportation Needs: Not on file  Physical Activity: Not on file  Stress: Not on file  Social Connections: Not on file  Intimate Partner Violence: Not on file    Family History  Problem Relation Age of Onset  . Diabetes Father   . Hypertension Mother   . Diabetes Sister   . Colon cancer Cousin   . Rectal cancer  Cousin   . Cancer Neg Hx   . Colon polyps Neg Hx   . Esophageal cancer Neg Hx   . Stomach cancer Neg Hx     The following portions of the patient's history were reviewed and updated as appropriate: allergies, current medications, past family history, past medical history, past social history, past surgical history and problem list.  Review of Systems Pertinent items are noted in HPI.   Objective:  BP 109/78   Pulse 78   Ht 5\' 1"  (1.549 m)   Wt 191 lb (86.6 kg)   LMP 06/02/2020   BMI 36.09 kg/m  Wt Readings from Last 3 Encounters:  06/10/20 191 lb (86.6 kg)  05/18/20 180 lb (81.6 kg)  03/30/20 192 lb (87.1 kg)     Chaperone present during exam  CONSTITUTIONAL:  Well-developed, well-nourished female in no acute distress.  HENT:  Normocephalic, atraumatic, External right and left ear normal. Oropharynx is clear and moist EYES: Conjunctivae and EOM are normal. Pupils are equal, round, and reactive to light. No scleral icterus.  NECK: Normal range of motion, supple, no masses.  Normal thyroid.   CARDIOVASCULAR: Normal heart rate noted, regular rhythm RESPIRATORY: Clear to auscultation bilaterally. Effort and breath sounds normal, no problems with respiration noted. BREASTS: Symmetric in size. No masses, skin changes, nipple drainage, or lymphadenopathy. ABDOMEN: Soft, normal bowel sounds, no distention noted.  No tenderness, rebound or guarding.  PELVIC: Normal appearing external genitalia; normal appearing vaginal mucosa and cervix.  No abnormal discharge noted.  Normal uterine size, no other palpable masses, no uterine or adnexal tenderness. MUSCULOSKELETAL: Normal range of motion. No tenderness.  No cyanosis, clubbing, or edema.  2+ distal pulses. SKIN: Skin is warm and dry. No rash noted. Not diaphoretic. No erythema. No pallor. NEUROLOGIC: Alert and oriented to person, place, and time. Normal reflexes, muscle tone coordination. No cranial nerve deficit noted. PSYCHIATRIC: Normal mood and affect. Normal behavior. Normal judgment and thought content.  Assessment:  Annual gynecologic examination with pap smear   Plan:  1. Well Woman Exam Will follow up results of pap smear and manage accordingly. STD testing discussed. Patient requested vaginal testing  2. Exposure to chlamydia TOC today   Routine preventative health maintenance measures emphasized. Please refer to After Visit Summary for other counseling recommendations.    05/28/20, DO [facility]

## 2020-06-13 LAB — CYTOLOGY - PAP
Adequacy: ABSENT
Chlamydia: NEGATIVE
Comment: NEGATIVE
Comment: NORMAL
Diagnosis: NEGATIVE
Neisseria Gonorrhea: NEGATIVE

## 2020-06-20 ENCOUNTER — Other Ambulatory Visit (HOSPITAL_BASED_OUTPATIENT_CLINIC_OR_DEPARTMENT_OTHER): Payer: Self-pay

## 2020-06-20 DIAGNOSIS — R0683 Snoring: Secondary | ICD-10-CM

## 2020-06-20 DIAGNOSIS — E669 Obesity, unspecified: Secondary | ICD-10-CM

## 2020-06-20 DIAGNOSIS — G4719 Other hypersomnia: Secondary | ICD-10-CM

## 2020-06-20 DIAGNOSIS — R0681 Apnea, not elsewhere classified: Secondary | ICD-10-CM

## 2020-07-12 ENCOUNTER — Telehealth: Payer: Self-pay

## 2020-07-12 DIAGNOSIS — B379 Candidiasis, unspecified: Secondary | ICD-10-CM

## 2020-07-12 MED ORDER — FLUCONAZOLE 150 MG PO TABS: ORAL_TABLET | ORAL | 1 refills | Status: DC

## 2020-07-12 NOTE — Telephone Encounter (Signed)
Pt called stating she is having vaginal irritation due to her latex allergy. Pt states she can not use Monistat but she has taken Diflucan before and it works better for her. Diflucan 150 mg 1 refill was sent to her pharmacy. Advised pt to use non latex condoms. Understanding was voiced. Brianna Byrd l Adit Riddles, CMA

## 2020-07-13 ENCOUNTER — Ambulatory Visit
Admission: EM | Admit: 2020-07-13 | Discharge: 2020-07-13 | Disposition: A | Discharge status: Home / Self Care | Payer: No Typology Code available for payment source | Attending: Emergency Medicine | Admitting: Emergency Medicine

## 2020-07-13 DIAGNOSIS — N898 Other specified noninflammatory disorders of vagina: Secondary | ICD-10-CM | POA: Diagnosis not present

## 2020-07-13 LAB — POCT URINE PREGNANCY: Preg Test, Ur: NEGATIVE

## 2020-07-13 LAB — POCT URINALYSIS DIP (MANUAL ENTRY)
Bilirubin, UA: NEGATIVE
Blood, UA: NEGATIVE
Glucose, UA: NEGATIVE mg/dL
Ketones, POC UA: NEGATIVE mg/dL
Nitrite, UA: NEGATIVE
Spec Grav, UA: 1.025 (ref 1.010–1.025)
Urobilinogen, UA: 0.2 E.U./dL
pH, UA: 7.5 (ref 5.0–8.0)

## 2020-07-13 NOTE — ED Triage Notes (Signed)
Pt reports having vaginal itching and discharge which is green in color.  Took one Diflucan yesterday.  Feels itching might be a reaction to a condom and it is improved since Diflucan but not resolved.  Was tx for Chlamydia recently and states those s/s had resolved. Denies abdominal or pelvic pain.

## 2020-07-13 NOTE — ED Provider Notes (Signed)
Brianna Byrd    CSN: [id number] Arrival date & time: 07/13/20  1134      History   Chief Complaint Chief Complaint  Patient presents with  . Vaginal Discharge    HPI Brianna Byrd is a 25 y.o. female.   Patient presents with vaginal itching and irritation x2 days.  She was treated for this by her OB/GYN with Diflucan yesterday.  She reports greenish-white vaginal discharge x1 day.  She states she is sexually active but consistently uses condoms.  She denies fever, chills, abdominal pain, dysuria, pelvic pain, or other symptoms.  Her medical history includes chlamydia and yeast on 05/18/2020; obesity, asthma, GERD, migraine headache.  The history is provided by the patient and medical records.    Past Medical History:  Diagnosis Date  . Asthma   . Obesity   . Reflux     Patient Active Problem List   Diagnosis Date Noted  . Neck pain on left side 12/21/2019  . Musculoskeletal pain 12/21/2019  . Abdominal bloating 07/30/2019  . Heartburn 07/30/2019  . Migraine without aura and without status migrainosus, not intractable 03/04/2019  . Left breast mass 01/27/2019  . History of pre-eclampsia 02/12/2017  . SVD (spontaneous vaginal delivery) 12/26/2016    Past Surgical History:  Procedure Laterality Date  . NO PAST SURGERIES    . UPPER GASTROINTESTINAL ENDOSCOPY  08/28/2019    OB History    Gravida  1   Para  1   Term  1   Preterm      AB      Living  1     SAB      IAB      Ectopic      Multiple  0   Live Births  1            Home Medications    Prior to Admission medications   Medication Sig Start Date End Date Taking? Authorizing Provider  fluconazole (DIFLUCAN) 150 MG tablet Take one tablet by mouth once. Repeat in 3 days if symptoms persist. 07/12/20  Yes Willodean Rosenthal, MD  esomeprazole (NEXIUM) 40 MG capsule Take 1 capsule (40 mg total) by mouth daily before breakfast. 08/28/19 05/18/20  Mansouraty, Netty Starring., MD   famotidine (PEPCID) 20 MG tablet Take 1 tablet (20 mg total) by mouth 2 (two) times daily. 10/24/19 05/18/20  Renne Crigler, PA-C  fluticasone (FLONASE) 50 MCG/ACT nasal spray Place 2 sprays into both nostrils daily. Patient not taking: Reported on 01/27/2019 06/05/18 05/01/19  Palumbo, April, MD  medroxyPROGESTERone (PROVERA) 10 MG tablet Take 1 tablet (10 mg total) by mouth daily for 7 days. 03/04/19 05/01/19  Sharlene Dory, DO  SUMAtriptan (IMITREX) 100 MG tablet Take 1 tablet (100 mg total) by mouth every 2 (two) hours as needed for migraine. May repeat in 2 hours if headache persists or recurs. Patient not taking: Reported on 03/27/2019 03/04/19 05/01/19  Sharlene Dory, DO    Family History Family History  Problem Relation Age of Onset  . Diabetes Father   . Hypertension Mother   . Diabetes Sister   . Colon cancer Cousin   . Rectal cancer Cousin   . Cancer Neg Hx   . Colon polyps Neg Hx   . Esophageal cancer Neg Hx   . Stomach cancer Neg Hx     Social History Social History   Tobacco Use  . Smoking status: Never Smoker  . Smokeless tobacco: Never Used  Vaping Use  .  Vaping Use: Never used  Substance Use Topics  . Alcohol use: No  . Drug use: No     Allergies   Peach [prunus persica]   Review of Systems Review of Systems  Constitutional: Negative for chills and fever.  HENT: Negative for ear pain and sore throat.   Eyes: Negative for pain and visual disturbance.  Respiratory: Negative for cough and shortness of breath.   Cardiovascular: Negative for chest pain and palpitations.  Gastrointestinal: Negative for abdominal pain and vomiting.  Genitourinary: Positive for vaginal discharge. Negative for dysuria, flank pain, hematuria and pelvic pain.  Musculoskeletal: Negative for arthralgias and back pain.  Skin: Negative for color change and rash.  Neurological: Negative for seizures and syncope.  All other systems reviewed and are  negative.    Physical Exam Triage Vital Signs ED Triage Vitals  Enc Vitals Group     BP      Pulse      Resp      Temp      Temp src      SpO2      Weight      Height      Head Circumference      Peak Flow      Pain Score      Pain Loc      Pain Edu?      Excl. in GC?    No data found.  Updated Vital Signs BP 125/89 (BP Location: Left Arm)   Pulse 81   Temp 98.6 F (37 C) (Oral)   Resp 18   LMP 06/25/2020   SpO2 96%   Visual Acuity Right Eye Distance:   Left Eye Distance:   Bilateral Distance:    Right Eye Near:   Left Eye Near:    Bilateral Near:     Physical Exam Vitals and nursing note reviewed.  Constitutional:      General: She is not in acute distress.    Appearance: She is well-developed. She is not ill-appearing.  HENT:     Head: Normocephalic and atraumatic.     Mouth/Throat:     Mouth: Mucous membranes are moist.  Eyes:     Conjunctiva/sclera: Conjunctivae normal.  Cardiovascular:     Rate and Rhythm: Normal rate and regular rhythm.     Heart sounds: Normal heart sounds.  Pulmonary:     Effort: Pulmonary effort is normal. No respiratory distress.     Breath sounds: Normal breath sounds.  Abdominal:     General: There is no distension.     Palpations: Abdomen is soft.     Tenderness: There is no abdominal tenderness. There is no right CVA tenderness, left CVA tenderness, guarding or rebound.  Musculoskeletal:     Cervical back: Neck supple.  Skin:    General: Skin is warm and dry.  Neurological:     General: No focal deficit present.     Mental Status: She is alert and oriented to person, place, and time.     Gait: Gait normal.  Psychiatric:        Mood and Affect: Mood normal.        Behavior: Behavior normal.      UC Treatments / Results  Labs (all labs ordered are listed, but only abnormal results are displayed) Labs Reviewed  URINE CULTURE  POCT URINALYSIS DIP (MANUAL ENTRY)  POCT URINE PREGNANCY  CERVICOVAGINAL  ANCILLARY ONLY    EKG   Radiology No results found.  Procedures Procedures (including critical care time)  Medications Ordered in UC Medications - No data to display  Initial Impression / Assessment and Plan / UC Course  I have reviewed the triage vital signs and the nursing notes.  Pertinent labs & imaging results that were available during my care of the patient were reviewed by me and considered in my medical decision making (see chart for details).   Vaginal discharge.  Urine pregnancy negative.  Urine culture pending.  Patient obtained vaginal self swab for testing.  Discussed with patient that we will call her if her test results are positive and that she may require treatment at that time.  She was treated with Diflucan yesterday and treated with doxycycline last month.  Instructed her to abstain from sexual activity until the test results are back.  Instructed her to follow-up with her PCP or OB/GYN if her symptoms are not improving.  Patient agrees to plan of care.    Final Clinical Impressions(s) / UC Diagnoses   Final diagnoses:  Vaginal discharge     Discharge Instructions     Your vaginal tests are pending.  If your test results are positive, we will call you.  You and your sexual partner(s) may require treatment at that time.  Do not have sexual activity for at least 7 days.    Follow up with your primary care provider or gynecologist if your symptoms are not improving.        ED Prescriptions    None     PDMP not reviewed this encounter.   Mickie Bail, NP 07/13/20 [phone]

## 2020-07-13 NOTE — Discharge Instructions (Signed)
Your vaginal tests are pending.  If your test results are positive, we will call you.  You and your sexual partner(s) may require treatment at that time.  Do not have sexual activity for at least 7 days.    Follow up with your primary care provider or gynecologist if your symptoms are not improving.    

## 2020-07-14 LAB — URINE CULTURE: Culture: 10000 — AB

## 2020-07-15 ENCOUNTER — Emergency Department (INDEPENDENT_AMBULATORY_CARE_PROVIDER_SITE_OTHER)
Admission: EM | Admit: 2020-07-15 | Discharge: 2020-07-15 | Disposition: A | Discharge status: Home / Self Care | Payer: No Typology Code available for payment source | Source: Home / Self Care

## 2020-07-15 ENCOUNTER — Encounter: Payer: Self-pay | Admitting: Emergency Medicine

## 2020-07-15 ENCOUNTER — Telehealth: Payer: Self-pay

## 2020-07-15 DIAGNOSIS — N898 Other specified noninflammatory disorders of vagina: Secondary | ICD-10-CM | POA: Diagnosis not present

## 2020-07-15 LAB — CERVICOVAGINAL ANCILLARY ONLY
Bacterial Vaginitis (gardnerella): POSITIVE — AB
Candida Glabrata: NEGATIVE
Candida Vaginitis: POSITIVE — AB
Chlamydia: NEGATIVE
Comment: NEGATIVE
Comment: NEGATIVE
Comment: NEGATIVE
Comment: NEGATIVE
Comment: NEGATIVE
Comment: NORMAL
Neisseria Gonorrhea: POSITIVE — AB
Trichomonas: NEGATIVE

## 2020-07-15 MED ORDER — METRONIDAZOLE 500 MG PO TABS: 500.0000 mg | ORAL_TABLET | Freq: Two times a day (BID) | ORAL | 0 refills | Status: DC

## 2020-07-15 MED ORDER — CEFTRIAXONE SODIUM 500 MG IJ SOLR
500.0000 mg | Freq: Once | INTRAMUSCULAR | Status: AC
  Administered 2020-07-15: 500 mg via INTRAMUSCULAR

## 2020-07-15 NOTE — ED Triage Notes (Addendum)
Pt here for medical treatment for lab results from 07/13/20. Flagyl sent to pharmacy by Dr Leonides Grills - pt confirmed notification from pharmacy

## 2020-07-20 ENCOUNTER — Ambulatory Visit: Payer: No Typology Code available for payment source | Admitting: Family Medicine

## 2020-07-27 ENCOUNTER — Other Ambulatory Visit: Payer: Self-pay | Admitting: Physician Assistant

## 2020-07-27 ENCOUNTER — Other Ambulatory Visit: Payer: Self-pay

## 2020-07-27 ENCOUNTER — Ambulatory Visit (HOSPITAL_BASED_OUTPATIENT_CLINIC_OR_DEPARTMENT_OTHER): Payer: No Typology Code available for payment source | Attending: Internal Medicine | Admitting: Internal Medicine

## 2020-07-27 DIAGNOSIS — E669 Obesity, unspecified: Secondary | ICD-10-CM

## 2020-07-27 DIAGNOSIS — R0683 Snoring: Secondary | ICD-10-CM

## 2020-07-27 DIAGNOSIS — R42 Dizziness and giddiness: Secondary | ICD-10-CM

## 2020-07-27 DIAGNOSIS — G4719 Other hypersomnia: Secondary | ICD-10-CM

## 2020-07-27 DIAGNOSIS — G44229 Chronic tension-type headache, not intractable: Secondary | ICD-10-CM

## 2020-07-27 DIAGNOSIS — H538 Other visual disturbances: Secondary | ICD-10-CM

## 2020-07-27 DIAGNOSIS — R0681 Apnea, not elsewhere classified: Secondary | ICD-10-CM

## 2020-07-28 ENCOUNTER — Other Ambulatory Visit: Payer: Self-pay | Admitting: Pain Medicine

## 2020-07-28 DIAGNOSIS — G44229 Chronic tension-type headache, not intractable: Secondary | ICD-10-CM | POA: Insufficient documentation

## 2020-07-28 DIAGNOSIS — R400 Somnolence: Secondary | ICD-10-CM | POA: Insufficient documentation

## 2020-07-28 DIAGNOSIS — E669 Obesity, unspecified: Secondary | ICD-10-CM | POA: Insufficient documentation

## 2020-07-28 DIAGNOSIS — E538 Deficiency of other specified B group vitamins: Secondary | ICD-10-CM | POA: Insufficient documentation

## 2020-07-28 MED ORDER — VITAMIN B-12 5000 MCG SL SUBL: 5000.0000 ug | SUBLINGUAL_TABLET | Freq: Every day | SUBLINGUAL | 0 refills | Status: DC

## 2020-07-29 ENCOUNTER — Encounter (HOSPITAL_BASED_OUTPATIENT_CLINIC_OR_DEPARTMENT_OTHER): Payer: Self-pay | Admitting: Internal Medicine

## 2020-07-29 ENCOUNTER — Ambulatory Visit: Payer: No Typology Code available for payment source | Admitting: Obstetrics & Gynecology

## 2020-08-05 ENCOUNTER — Ambulatory Visit
Admission: RE | Admit: 2020-08-05 | Discharge: 2020-08-05 | Disposition: A | Discharge status: Home / Self Care | Payer: No Typology Code available for payment source | Source: Ambulatory Visit | Attending: Physician Assistant | Admitting: Physician Assistant

## 2020-08-05 ENCOUNTER — Other Ambulatory Visit: Payer: No Typology Code available for payment source

## 2020-08-05 DIAGNOSIS — H538 Other visual disturbances: Secondary | ICD-10-CM

## 2020-08-05 DIAGNOSIS — G44229 Chronic tension-type headache, not intractable: Secondary | ICD-10-CM

## 2020-08-05 DIAGNOSIS — R42 Dizziness and giddiness: Secondary | ICD-10-CM

## 2020-08-05 MED ORDER — GADOBENATE DIMEGLUMINE 529 MG/ML IV SOLN
17.0000 mL | Freq: Once | INTRAVENOUS | Status: AC | PRN
  Administered 2020-08-05: 17 mL via INTRAVENOUS

## 2020-08-08 ENCOUNTER — Other Ambulatory Visit (HOSPITAL_BASED_OUTPATIENT_CLINIC_OR_DEPARTMENT_OTHER): Payer: Self-pay

## 2020-08-08 DIAGNOSIS — G4719 Other hypersomnia: Secondary | ICD-10-CM

## 2020-08-08 DIAGNOSIS — R0683 Snoring: Secondary | ICD-10-CM

## 2020-08-08 DIAGNOSIS — R0681 Apnea, not elsewhere classified: Secondary | ICD-10-CM

## 2020-08-08 DIAGNOSIS — E669 Obesity, unspecified: Secondary | ICD-10-CM

## 2020-08-30 ENCOUNTER — Other Ambulatory Visit: Payer: Self-pay

## 2020-08-30 ENCOUNTER — Ambulatory Visit (HOSPITAL_BASED_OUTPATIENT_CLINIC_OR_DEPARTMENT_OTHER): Payer: No Typology Code available for payment source | Attending: Sleep Medicine | Admitting: Sleep Medicine

## 2020-08-30 VITALS — Ht 61.0 in | Wt 187.0 lb

## 2020-08-30 DIAGNOSIS — G4719 Other hypersomnia: Secondary | ICD-10-CM

## 2020-08-30 DIAGNOSIS — R0683 Snoring: Secondary | ICD-10-CM | POA: Diagnosis not present

## 2020-08-30 DIAGNOSIS — E669 Obesity, unspecified: Secondary | ICD-10-CM

## 2020-08-30 DIAGNOSIS — R0681 Apnea, not elsewhere classified: Secondary | ICD-10-CM

## 2020-08-31 ENCOUNTER — Encounter: Payer: Self-pay | Admitting: Neurology

## 2020-09-07 ENCOUNTER — Emergency Department (HOSPITAL_BASED_OUTPATIENT_CLINIC_OR_DEPARTMENT_OTHER)
Admission: EM | Admit: 2020-09-07 | Discharge: 2020-09-07 | Disposition: A | Discharge status: Home / Self Care | Payer: No Typology Code available for payment source | Attending: Emergency Medicine | Admitting: Emergency Medicine

## 2020-09-07 ENCOUNTER — Encounter (HOSPITAL_BASED_OUTPATIENT_CLINIC_OR_DEPARTMENT_OTHER): Payer: Self-pay | Admitting: Emergency Medicine

## 2020-09-07 ENCOUNTER — Other Ambulatory Visit: Payer: Self-pay

## 2020-09-07 DIAGNOSIS — J45909 Unspecified asthma, uncomplicated: Secondary | ICD-10-CM | POA: Diagnosis not present

## 2020-09-07 DIAGNOSIS — H9313 Tinnitus, bilateral: Secondary | ICD-10-CM | POA: Diagnosis not present

## 2020-09-07 DIAGNOSIS — Z7951 Long term (current) use of inhaled steroids: Secondary | ICD-10-CM | POA: Insufficient documentation

## 2020-09-07 DIAGNOSIS — R42 Dizziness and giddiness: Secondary | ICD-10-CM

## 2020-09-07 DIAGNOSIS — R110 Nausea: Secondary | ICD-10-CM | POA: Diagnosis not present

## 2020-09-07 MED ORDER — MECLIZINE HCL 25 MG PO TABS: 25.0000 mg | ORAL_TABLET | Freq: Three times a day (TID) | ORAL | 0 refills | Status: DC | PRN

## 2020-09-07 NOTE — Discharge Instructions (Signed)
You are seen in the emergency department for evaluation of dizziness.  This is likely vertigo.  We are prescribing some meclizine and putting in referrals to physical therapy and neurology.  Please follow-up with your doctor also.  Return to the emergency department if any worsening or concerning symptoms

## 2020-09-07 NOTE — ED Provider Notes (Signed)
[facility] EMERGENCY DEPARTMENT Provider Note   CSN: [id number] Arrival date & time: 09/07/20  0715     History Chief Complaint  Patient presents with   Dizziness    Brianna Byrd is a 25 y.o. female.  She is here with a complaint of dizziness, room spinning that is been going on for about 3 months.  Some days are better some days are worse.  It was bad again today.  Could not go to work.  She saw her PCP for it and had an MRI of her brain and cervical spine that was negative.  She has a neurology appointment in September.  She has tried no medications for this.  No recent head injuries.  She does not take any medications.  No other illness symptoms including fevers chills vomiting diarrhea or urinary symptoms.  She does experience some nausea with the dizziness.  She has had some ringing in her ears.  The history is provided by the patient.  Dizziness Quality:  Room spinning and imbalance Severity:  Moderate Onset quality:  Gradual Duration:  3 months Timing:  Intermittent Progression:  Unchanged Chronicity:  New Context: head movement   Relieved by:  Nothing Worsened by:  Turning head Ineffective treatments:  None tried Associated symptoms: nausea and tinnitus   Associated symptoms: no chest pain, no diarrhea, no headaches, no shortness of breath and no vomiting   Risk factors: no hx of vertigo       Past Medical History:  Diagnosis Date   Asthma    Obesity    Reflux     Patient Active Problem List   Diagnosis Date Noted   Witnessed apneic spells 08/30/2020   Chronic tension-type headache 07/28/2020   Daytime somnolence 07/28/2020   Obesity 07/28/2020   Vitamin B12 deficiency 07/28/2020   Neck pain on left side 12/21/2019   Musculoskeletal pain 12/21/2019   Abdominal bloating 07/30/2019   Heartburn 07/30/2019   Migraine without aura and without status migrainosus, not intractable 03/04/2019   Left breast mass 01/27/2019   Amenorrhea 08/26/2017    Goiter diffuse 08/26/2017   History of pre-eclampsia 02/12/2017   SVD (spontaneous vaginal delivery) 12/26/2016   Psychophysiologic insomnia 12/21/2016   Trichomonal vaginitis during pregnancy, antepartum 06/05/2016   Pregnant 05/14/2016   Constitutional short stature 05/14/2016    Past Surgical History:  Procedure Laterality Date   NO PAST SURGERIES     UPPER GASTROINTESTINAL ENDOSCOPY  08/28/2019     OB History     Gravida  1   Para  1   Term  1   Preterm      AB      Living  1      SAB      IAB      Ectopic      Multiple  0   Live Births  1           Family History  Problem Relation Age of Onset   Diabetes Father    Hypertension Mother    Diabetes Sister    Colon cancer Cousin    Rectal cancer Cousin    Cancer Neg Hx    Colon polyps Neg Hx    Esophageal cancer Neg Hx    Stomach cancer Neg Hx     Social History   Tobacco Use   Smoking status: Never   Smokeless tobacco: Never  Vaping Use   Vaping Use: Never used  Substance Use Topics   Alcohol  use: No   Drug use: No    Home Medications Prior to Admission medications   Medication Sig Start Date End Date Taking? Authorizing Provider  Cyanocobalamin (VITAMIN B-12) 5000 MCG SUBL Place 1 tablet (5,000 mcg total) under the tongue daily. 07/28/20 10/26/20  Delano Metz, MD  fluconazole (DIFLUCAN) 150 MG tablet Take one tablet by mouth once. Repeat in 3 days if symptoms persist. 07/12/20   Willodean Rosenthal, MD  metroNIDAZOLE (FLAGYL) 500 MG tablet Take 1 tablet (500 mg total) by mouth 2 (two) times daily. 07/15/20   Merrilee Jansky, MD  esomeprazole (NEXIUM) 40 MG capsule Take 1 capsule (40 mg total) by mouth daily before breakfast. 08/28/19 05/18/20  Mansouraty, Netty Starring., MD  famotidine (PEPCID) 20 MG tablet Take 1 tablet (20 mg total) by mouth 2 (two) times daily. 10/24/19 05/18/20  Renne Crigler, PA-C  fluticasone (FLONASE) 50 MCG/ACT nasal spray Place 2 sprays into both nostrils  daily. Patient not taking: Reported on 01/27/2019 06/05/18 05/01/19  Palumbo, April, MD  medroxyPROGESTERone (PROVERA) 10 MG tablet Take 1 tablet (10 mg total) by mouth daily for 7 days. 03/04/19 05/01/19  Sharlene Dory, DO  SUMAtriptan (IMITREX) 100 MG tablet Take 1 tablet (100 mg total) by mouth every 2 (two) hours as needed for migraine. May repeat in 2 hours if headache persists or recurs. Patient not taking: Reported on 03/27/2019 03/04/19 05/01/19  Sharlene Dory, DO    Allergies    Peach [prunus persica]  Review of Systems   Review of Systems  Constitutional:  Negative for fever.  HENT:  Positive for tinnitus. Negative for sore throat.   Eyes:  Negative for pain.  Respiratory:  Negative for shortness of breath.   Cardiovascular:  Negative for chest pain.  Gastrointestinal:  Positive for nausea. Negative for abdominal pain, diarrhea and vomiting.  Genitourinary:  Negative for dysuria.  Musculoskeletal:  Negative for neck pain.  Skin:  Negative for rash.  Neurological:  Positive for dizziness. Negative for headaches.   Physical Exam Updated Vital Signs BP 113/81   Pulse 78   Temp 98.2 F (36.8 C) (Oral)   Resp 20   Ht 5\' 1"  (1.549 m)   Wt 84.8 kg   SpO2 100%   BMI 35.33 kg/m   Physical Exam Vitals and nursing note reviewed.  Constitutional:      General: She is not in acute distress.    Appearance: She is well-developed.  HENT:     Head: Normocephalic and atraumatic.     Right Ear: Tympanic membrane normal.     Left Ear: Tympanic membrane normal.     Mouth/Throat:     Mouth: Mucous membranes are moist.     Pharynx: Oropharynx is clear.  Eyes:     Extraocular Movements: Extraocular movements intact.     Conjunctiva/sclera: Conjunctivae normal.     Pupils: Pupils are equal, round, and reactive to light.     Comments: Horizontal nystagmus  Cardiovascular:     Rate and Rhythm: Normal rate and regular rhythm.     Heart sounds: No murmur  heard. Pulmonary:     Effort: Pulmonary effort is normal. No respiratory distress.     Breath sounds: Normal breath sounds.  Abdominal:     Palpations: Abdomen is soft.     Tenderness: There is no abdominal tenderness.  Musculoskeletal:        General: No deformity or signs of injury. Normal range of motion.     Cervical back:  Neck supple.  Skin:    General: Skin is warm and dry.  Neurological:     General: No focal deficit present.     Mental Status: She is alert and oriented to person, place, and time.     Cranial Nerves: No cranial nerve deficit.     Sensory: No sensory deficit.     Motor: No weakness.     Gait: Gait normal.    ED Results / Procedures / Treatments   Labs (all labs ordered are listed, but only abnormal results are displayed) Labs Reviewed - No data to display  EKG None  Radiology SLEEP STUDY DOCUMENTS  Result Date: 09/05/2020 Ordered by an unspecified provider.   Procedures Procedures   Medications Ordered in ED Medications - No data to display  ED Course  I have reviewed the triage vital signs and the nursing notes.  Pertinent labs & imaging results that were available during my care of the patient were reviewed by me and considered in my medical decision making (see chart for details).    MDM Rules/Calculators/A&P                         25 year old female here with 1 month of vertiginous symptoms.  She is a normal neurologic exam.  Does have some nystagmus.  PCP is already obtained MRI of brain and cervical spine without any acute findings.  Will treat with an order PT referral for vestibular training.  Also placed neurology referral.  Return instructions discussed with patient.  Final Clinical Impression(s) / ED Diagnoses Final diagnoses:  Vertigo    Rx / DC Orders ED Discharge Orders          Ordered    meclizine (ANTIVERT) 25 MG tablet  3 times daily PRN        09/07/20 0759    Ambulatory referral to Neurology       Comments: An  appointment is requested in approximately: 2 weeks   09/07/20 0759    Ambulatory referral to Physical Therapy        09/07/20 0802             Terrilee Files, MD 09/07/20 [phone]

## 2020-09-07 NOTE — ED Triage Notes (Addendum)
Dizzy x 1 month states has seenn her dr but this am it got worse than  the past week ? Vertigo had CT of head last month

## 2020-09-13 NOTE — Procedures (Signed)
   NAME: Special educational needs teacher DATE OF BIRTH:  11-Jan-1996 MEDICAL RECORD NUMBER [id number]  LOCATION: [facility]  PHYSICIAN:  D   DATE OF STUDY: 08/30/2020  SLEEP STUDY TYPE: Nocturnal Polysomnogram               REFERRING PHYSICIAN: Alanda Slim, MD   EPWORTH SLEEPINESS SCORE:   HEIGHT: 5\' 1"  (154.9 cm)  WEIGHT: 187 lb (84.8 kg)    Body mass index is 35.33 kg/m.  NECK SIZE: 15 in.  CLINICAL INFORMATION Brianna Byrd is a 25 year old Female and was referred to the sleep center for evaluation of G47.33 OSA: Adult and Pediatric (327.23). Indications include fatigue, headaches, snoring and witnessed apnea.  MEDICATIONS Patient self administered medications include: N/A. No sleep medicine administered.  SLEEP STUDY TECHNIQUE A multi-channel overnight Polysomnography study was performed. The channels recorded and monitored were central and occipital EEG, electrooculogram (EOG), submentalis EMG (chin), nasal and oral airflow, thoracic and abdominal wall motion, anterior tibialis EMG, snore microphone, electrocardiogram, and a pulse oximetry. SLEEP ARCHITECTURE The study was initiated at 11:04:47 PM and terminated at 5:11:39 AM. The total recorded time was 366.9 minutes. EEG confirmed total sleep time was 335.5 minutes yielding a sleep efficiency of 91.4%. Sleep onset after lights out was 13.6 minutes with a REM latency of 72.0 minutes. The patient spent 5.8% of the night in stage N1 sleep, 73.6% in stage N2 sleep, 0.6% in stage N3 and 20% in REM. Wake after sleep onset (WASO) was 17.8 minutes. The Arousal Index was 14.7/hour. RESPIRATORY PARAMETERS There were a total of 2 respiratory disturbances out of which 1 were apneas (0 obstructive, 0 mixed, 1 central) and 1 hypopneas. The apnea/hypopnea index (AHI) was 0.4 events/hour. The central sleep apnea index was 0.2 events/hour. The REM AHI was 0.9 events/hour and NREM AHI was 0.2 events/hour. The supine  AHI was 0.9 events/hour and the non supine AHI was 0.2 supine during 20.67% of sleep. Respiratory disturbances were associated with oxygen desaturation down to a nadir of 93.0% during sleep. The mean oxygen saturation during the study was 97.6%. The cumulative time under 88% oxygen saturation was 0 minutes.  LEG MOVEMENT DATA The total leg movements were 0 with a resulting leg movement index of 0.0/hr .Associated arousal with leg movement index was 0.0/hr.  CARDIAC DATA The underlying cardiac rhythm was most consistent with sinus rhythm. Mean heart rate during sleep was 75.0 bpm. Additional rhythm abnormalities include None. IMPRESSIONS - No Significant Obstructive Sleep apnea(OSA) - The patient snored with soft snoring volume. - No significant periodic leg movements(PLMs) during sleep. DIAGNOSIS - Primary Snoring (R06.83) RECOMMENDATIONS - Avoid alcohol, sedatives and other CNS depressants that may worsen sleep apnea and disrupt normal sleep architecture. - Sleep hygiene should be reviewed to assess factors that may improve sleep quality. - Weight management and regular exercise should be initiated or continued. - Recommend a trial of Oral Appliance for snoring.   12-25-1990, MD Sleep Specialist, American Board of Internal Medicine  ELECTRONICALLY SIGNED ON:  09/13/2020, 4:16 PM [facility] PH: [phone]   FX: [phone] ACCREDITED BY THE AMERICAN ACADEMY OF SLEEP MEDICINE

## 2020-09-20 ENCOUNTER — Ambulatory Visit: Payer: No Typology Code available for payment source | Attending: Emergency Medicine | Admitting: Physical Therapy

## 2020-09-20 ENCOUNTER — Other Ambulatory Visit: Payer: Self-pay

## 2020-09-20 DIAGNOSIS — R42 Dizziness and giddiness: Secondary | ICD-10-CM | POA: Insufficient documentation

## 2020-09-20 DIAGNOSIS — R293 Abnormal posture: Secondary | ICD-10-CM | POA: Diagnosis present

## 2020-09-20 DIAGNOSIS — M542 Cervicalgia: Secondary | ICD-10-CM | POA: Diagnosis present

## 2020-09-20 NOTE — Therapy (Signed)
[facility] [address] [place], [place], [address] Phone: [phone]   Fax:  [phone]  Physical Therapy Evaluation  Patient Details  Name: Brianna Byrd MRN: [id number] Date of Birth: [birth year] Referring Provider (PT): Terrilee FilesButler, Michael C, MD   Encounter Date: 09/20/2020   PT End of Session - 09/20/20 1528     Visit Number 1    Number of Visits 13    Date for PT Re-Evaluation 11/01/20    Authorization Type [employer]    PT Start Time 1450    PT Stop Time 1530    PT Time Calculation (min) 40 min    Activity Tolerance Patient tolerated treatment well    Behavior [facility] for tasks assessed/performed             Past Medical History:  Diagnosis Date   Asthma    Obesity    Reflux     Past Surgical History:  Procedure Laterality Date   NO PAST SURGERIES     UPPER GASTROINTESTINAL ENDOSCOPY  08/28/2019    There were no vitals filed for this visit.    Subjective Assessment - 09/20/20 1451     Subjective Pt reports increased headaches, dizziness, and feelings of being off balance. Pt reports feeling "orthostatic" when she lays down with ringing in her ears. Pt states dizziness has been ongoing x3 months but first major occurrence happened last week when she went to ED due to increased nausea and dizziness while getting ready for work. Neck pain recently started 2 wks ago with numbness/tingling down her L arm. Reports last vertigo spell occuring last week while driving in the car.    Pertinent History Asthma, obesity, reflux    How long can you sit comfortably? n/a    How long can you stand comfortably? n/a    How long can you walk comfortably? n/a    Patient Stated Goals Improve pain and dizziness    Currently in Pain? No/denies                [facility] PT Assessment - 09/20/20 0001       Assessment   Medical Diagnosis vertigo    Referring Provider (PT) Terrilee FilesButler, Michael  C, MD    Onset Date/Surgical Date --   ~3 months ago   Prior Therapy None      Precautions   Precautions None      Restrictions   Weight Bearing Restrictions No      Balance Screen   Has the patient fallen in the past 6 months No      Home Environment   Living Environment Private residence    Living Arrangements Parent;Children    Available Help at Discharge Family      Prior Function   Level of Independence Independent    Vocation Full time employment    Vocation Requirements Unit clerk; helps prep patients for procedures      Observation/Other Assessments   Focus on Therapeutic Outcomes (FOTO)  n/a      ROM / Strength   AROM / PROM / Strength AROM      AROM   Overall AROM Comments Cervical AROM WFL; difficulty performing R & L side flexion without UT activation      Palpation   Spinal mobility hypermobile C-spine; hypomobile upper T-spine    Palpation comment TTP bilat suboccipitals (R worse than L), bilat upper trap (R worse than L)  Special Tests    Special Tests Cervical    Cervical Tests Vertebral Artery Test      Vertebral Artery Test    Findings Negative      Functional Gait  Assessment   Gait assessed  Yes    Gait Level Surface Walks 20 ft in less than 5.5 sec, no assistive devices, good speed, no evidence for imbalance, normal gait pattern, deviates no more than 6 in outside of the 12 in walkway width.    Change in Gait Speed Able to smoothly change walking speed without loss of balance or gait deviation. Deviate no more than 6 in outside of the 12 in walkway width.    Gait with Horizontal Head Turns Performs head turns smoothly with no change in gait. Deviates no more than 6 in outside 12 in walkway width   Reports some mild symptoms   Gait with Vertical Head Turns Performs head turns with no change in gait. Deviates no more than 6 in outside 12 in walkway width.   Reports some mild symptoms   Gait and Pivot Turn Pivot turns safely within 3 sec and  stops quickly with no loss of balance.    Step Over Obstacle Is able to step over 2 stacked shoe boxes taped together (9 in total height) without changing gait speed. No evidence of imbalance.    Gait with Narrow Base of Support Is able to ambulate for 10 steps heel to toe with no staggering.    Gait with Eyes Closed Walks 20 ft, uses assistive device, slower speed, mild gait deviations, deviates 6-10 in outside 12 in walkway width. Ambulates 20 ft in less than 9 sec but greater than 7 sec.    Ambulating Backwards Walks 20 ft, uses assistive device, slower speed, mild gait deviations, deviates 6-10 in outside 12 in walkway width.    Steps Alternating feet, no rail.    Total Score 28    FGA comment: <26/30 indicates high fall risk                    Vestibular Assessment - 09/20/20 0001       Symptom Behavior   Subjective history of current problem Originally thought it was her glasses or because of headaches    Type of Dizziness  Spinning   Room spinning, nausea   Frequency of Dizziness Twice a day    Duration of Dizziness about an hour    Symptom Nature Spontaneous    Aggravating Factors No known aggravating factors    Relieving Factors Dark room;Rest;Lying supine    Progression of Symptoms Worse   Neck pain and arm numbness   History of similar episodes Had headaches in the past due to birth control      Oculomotor Exam   Oculomotor Alignment Normal    Ocular ROM WFL    Spontaneous Absent    Gaze-induced  Absent    Head shaking Horizontal Absent   "just a little bit of dizziness"   Head Shaking Vertical Absent   "just a little bit"   Smooth Pursuits Intact    Saccades Slow;Comment   Reports dizziness with horizontal movement     Vestibulo-Ocular Reflex   VOR 1 Head Only (x 1 viewing) "Just a little"    VOR 2 Head and Object (x 2 viewing) "not as bad as the previous"    VOR Cancellation Comment   "It takes the room time to catch up a little bit"  Comment HIT:  Reports increased symptoms with L head movement but no overt corrective saccades noted      Positional Testing   Dix-Hallpike Dix-Hallpike Right;Dix-Hallpike Left      Dix-Hallpike Right   Dix-Hallpike Right Duration 0      Dix-Hallpike Left   Dix-Hallpike Left Duration 0    Dix-Hallpike Left Symptoms No nystagmus   "room just went out of focus"     Orthostatics   BP supine (x 5 minutes) 109/72    HR supine (x 5 minutes) 89    BP sitting 117/82    HR sitting 85    BP standing (after 1 minute) 106/76    HR standing (after 1 minute) 87                Objective measurements completed on examination: See above findings.               PT Education - 09/20/20 1528     Education Details Discussed exam findings and POC    Person(s) Educated Patient    Methods Explanation    Comprehension Verbalized understanding              PT Short Term Goals - 09/20/20 1542       PT SHORT TERM GOAL #1   Title STGs = LTGs               PT Long Term Goals - 09/20/20 1542       PT LONG TERM GOAL #1   Title Pt will be independent with HEP to improve head/neck posture    Time 6    Period Weeks    Status New    Target Date 11/01/20      PT LONG TERM GOAL #2   Title Pt will report >50% improvement with dizziness    Time 6    Period Weeks    Status New    Target Date 11/01/20      PT LONG TERM GOAL #3   Title Pt will report >50% resolution of headaches    Time 6    Period Weeks    Status New    Target Date 11/01/20                    Plan - 09/20/20 1529     Clinical Impression Statement Ms. Brianna Byrd is a 25 y/o F presenting to OPPT due to c/o ongoing dizziness, decreased balance, headaches with insidious onset x 3 months. Pt reports "episodes" of nausea and dizziness in the past week with increased neck pain and L arm numbness. Pt is (-) for any nystagmus with all canal testing (but reports blurred/unfocused vision) and vestibular  assessment did not yield any overt s/s of vestibular dysfunction. Pt's posture is significant for forward head and rounded shoulders with tenderness to palpation/pain along bilat neck, hypermobile C-spine and hypomobile T-spine. S/S appear more plausible for cervicogenic origin of pt's dizziness due to some mild increased symptoms with head turns. Pt would benefit from trial of PT to decrease pt's symptoms.    Personal Factors and Comorbidities Age;Time since onset of injury/illness/exacerbation    Examination-Activity Limitations Stand;Locomotion Level    Examination-Participation Restrictions Occupation;Cleaning;Driving    Stability/Clinical Decision Making Evolving/Moderate complexity    Clinical Decision Making Moderate    Rehab Potential Good    PT Frequency 2x / week    PT Duration 6 weeks    PT Treatment/Interventions ADLs/Self Care Home  Management;Moist Heat;Aquatic Therapy;Traction;Functional mobility training;Therapeutic activities;Therapeutic exercise;Balance training;Neuromuscular re-education;Manual techniques;Patient/family education;Passive range of motion;Dry needling;Taping;Vestibular;Spinal Manipulations;Joint Manipulations    PT Next Visit Plan Please assess UE strength (did not have time on eval), cervical relocation, cervical neck torsion test. Initiate manual therapy for neck/shoulders and strengthening.    Consulted and Agree with Plan of Care Patient             Patient will benefit from skilled therapeutic intervention in order to improve the following deficits and impairments:  Increased fascial restricitons, Dizziness, Increased muscle spasms, Pain, Decreased mobility, Decreased strength, Postural dysfunction  Visit Diagnosis: Abnormal posture  Dizziness and giddiness  Cervicalgia     Problem List Patient Active Problem List   Diagnosis Date Noted   Witnessed apneic spells 08/30/2020   Chronic tension-type headache 07/28/2020   Daytime somnolence  07/28/2020   Obesity 07/28/2020   Vitamin B12 deficiency 07/28/2020   Neck pain on left side 12/21/2019   Musculoskeletal pain 12/21/2019   Abdominal bloating 07/30/2019   Heartburn 07/30/2019   Migraine without aura and without status migrainosus, not intractable 03/04/2019   Left breast mass 01/27/2019   Amenorrhea 08/26/2017   Goiter diffuse 08/26/2017   History of pre-eclampsia 02/12/2017   SVD (spontaneous vaginal delivery) 12/26/2016   Psychophysiologic insomnia 12/21/2016   Trichomonal vaginitis during pregnancy, antepartum 06/05/2016   Pregnant 05/14/2016   Constitutional short stature 05/14/2016    Brianna Byrd April Ma L Loza Prell PT, DPT 09/20/2020, 3:55 PM  [facility] [address] [place], [place], [address] Phone: [phone]   Fax:  [phone]  Name: Melea Prezioso MRN: [id number] Date of Birth: 1995-10-24

## 2020-09-21 ENCOUNTER — Ambulatory Visit: Payer: No Typology Code available for payment source

## 2020-09-22 ENCOUNTER — Ambulatory Visit (INDEPENDENT_AMBULATORY_CARE_PROVIDER_SITE_OTHER): Payer: No Typology Code available for payment source

## 2020-09-22 ENCOUNTER — Other Ambulatory Visit (HOSPITAL_COMMUNITY)
Admission: RE | Admit: 2020-09-22 | Discharge: 2020-09-22 | Disposition: A | Discharge status: Home / Self Care | Payer: No Typology Code available for payment source | Source: Ambulatory Visit | Attending: Family Medicine | Admitting: Family Medicine

## 2020-09-22 ENCOUNTER — Other Ambulatory Visit: Payer: Self-pay

## 2020-09-22 VITALS — BP 110/77 | HR 83 | Wt 187.0 lb

## 2020-09-22 DIAGNOSIS — Z113 Encounter for screening for infections with a predominantly sexual mode of transmission: Secondary | ICD-10-CM | POA: Diagnosis present

## 2020-09-22 DIAGNOSIS — R399 Unspecified symptoms and signs involving the genitourinary system: Secondary | ICD-10-CM | POA: Diagnosis not present

## 2020-09-22 DIAGNOSIS — Z118 Encounter for screening for other infectious and parasitic diseases: Secondary | ICD-10-CM

## 2020-09-22 LAB — POCT URINALYSIS DIPSTICK
Bilirubin, UA: NEGATIVE
Blood, UA: NEGATIVE
Glucose, UA: NEGATIVE
Ketones, UA: NEGATIVE
Nitrite, UA: POSITIVE
Protein, UA: NEGATIVE
Spec Grav, UA: 1.015 (ref 1.010–1.025)
Urobilinogen, UA: 0.2 E.U./dL
pH, UA: 7.5 (ref 5.0–8.0)

## 2020-09-22 MED ORDER — NITROFURANTOIN MONOHYD MACRO 100 MG PO CAPS: 100.0000 mg | ORAL_CAPSULE | Freq: Two times a day (BID) | ORAL | 0 refills | Status: DC

## 2020-09-22 NOTE — Progress Notes (Signed)
Chart reviewed - agree with CMA/RN documentation.  ° °

## 2020-09-22 NOTE — Progress Notes (Signed)
SUBJECTIVE: Brianna Byrd is a 25 y.o. female who complains of urinary frequency, urgency and dysuria x 1 week, without flank pain, fever, chills, or abnormal vaginal discharge or bleeding.   OBJECTIVE: Appears well, in no apparent distress.  Vital signs are normal. Urine dipstick shows positive for nitrates.    ASSESSMENT: Dysuria  PLAN: Treatment per orders.  Call or return to clinic prn if these symptoms worsen or fail to improve as anticipated.   Pt also requests to be tested for GC/Chlamydia. Self swab was sent to the lab. Nevae Pinnix l Verenis [place], CMA

## 2020-09-23 LAB — CERVICOVAGINAL ANCILLARY ONLY
Chlamydia: NEGATIVE
Comment: NEGATIVE
Comment: NORMAL
Neisseria Gonorrhea: NEGATIVE

## 2020-09-24 LAB — URINE CULTURE

## 2020-09-30 ENCOUNTER — Ambulatory Visit: Payer: No Typology Code available for payment source | Admitting: Physical Therapy

## 2020-10-03 ENCOUNTER — Emergency Department (HOSPITAL_BASED_OUTPATIENT_CLINIC_OR_DEPARTMENT_OTHER)
Admission: EM | Admit: 2020-10-03 | Discharge: 2020-10-03 | Disposition: A | Discharge status: Home / Self Care | Payer: No Typology Code available for payment source | Attending: Emergency Medicine | Admitting: Emergency Medicine

## 2020-10-03 ENCOUNTER — Other Ambulatory Visit: Payer: Self-pay

## 2020-10-03 ENCOUNTER — Emergency Department (HOSPITAL_BASED_OUTPATIENT_CLINIC_OR_DEPARTMENT_OTHER): Payer: No Typology Code available for payment source

## 2020-10-03 DIAGNOSIS — R000 Tachycardia, unspecified: Secondary | ICD-10-CM | POA: Insufficient documentation

## 2020-10-03 DIAGNOSIS — J45909 Unspecified asthma, uncomplicated: Secondary | ICD-10-CM | POA: Diagnosis not present

## 2020-10-03 DIAGNOSIS — R042 Hemoptysis: Secondary | ICD-10-CM | POA: Diagnosis present

## 2020-10-03 DIAGNOSIS — M791 Myalgia, unspecified site: Secondary | ICD-10-CM | POA: Diagnosis not present

## 2020-10-03 DIAGNOSIS — R0602 Shortness of breath: Secondary | ICD-10-CM | POA: Diagnosis not present

## 2020-10-03 DIAGNOSIS — Z20822 Contact with and (suspected) exposure to covid-19: Secondary | ICD-10-CM | POA: Insufficient documentation

## 2020-10-03 DIAGNOSIS — K219 Gastro-esophageal reflux disease without esophagitis: Secondary | ICD-10-CM

## 2020-10-03 DIAGNOSIS — J029 Acute pharyngitis, unspecified: Secondary | ICD-10-CM | POA: Diagnosis not present

## 2020-10-03 DIAGNOSIS — J069 Acute upper respiratory infection, unspecified: Secondary | ICD-10-CM

## 2020-10-03 DIAGNOSIS — R0981 Nasal congestion: Secondary | ICD-10-CM | POA: Diagnosis not present

## 2020-10-03 LAB — BASIC METABOLIC PANEL
Anion gap: 5 (ref 5–15)
BUN: 6 mg/dL (ref 6–20)
CO2: 26 mmol/L (ref 22–32)
Calcium: 8.6 mg/dL — ABNORMAL LOW (ref 8.9–10.3)
Chloride: 105 mmol/L (ref 98–111)
Creatinine, Ser: 0.61 mg/dL (ref 0.44–1.00)
GFR, Estimated: >60 mL/min (ref >60)
Glucose, Bld: 78 mg/dL (ref 70–99)
Potassium: 3.4 mmol/L — ABNORMAL LOW (ref 3.5–5.1)
Sodium: 136 mmol/L (ref 135–145)

## 2020-10-03 LAB — RESP PANEL BY RT-PCR (FLU A&B, COVID) ARPGX2
Influenza A by PCR: NEGATIVE
Influenza B by PCR: NEGATIVE
SARS Coronavirus 2 by RT PCR: NEGATIVE

## 2020-10-03 LAB — CBC WITH DIFFERENTIAL/PLATELET
Abs Immature Granulocytes: 0.04 10*3/uL (ref 0.00–0.07)
Basophils Absolute: 0 10*3/uL (ref 0.0–0.1)
Basophils Relative: 0 %
Eosinophils Absolute: 0 10*3/uL (ref 0.0–0.5)
Eosinophils Relative: 0 %
HCT: 33.7 % — ABNORMAL LOW (ref 36.0–46.0)
Hemoglobin: 10.5 g/dL — ABNORMAL LOW (ref 12.0–15.0)
Immature Granulocytes: 0 %
Lymphocytes Relative: 11 %
Lymphs Abs: 1.1 10*3/uL (ref 0.7–4.0)
MCH: 26.1 pg (ref 26.0–34.0)
MCHC: 31.2 g/dL (ref 30.0–36.0)
MCV: 83.6 fL (ref 80.0–100.0)
Monocytes Absolute: 0.9 10*3/uL (ref 0.1–1.0)
Monocytes Relative: 9 %
Neutro Abs: 7.9 10*3/uL — ABNORMAL HIGH (ref 1.7–7.7)
Neutrophils Relative %: 80 %
Platelets: 272 10*3/uL (ref 150–400)
RBC: 4.03 MIL/uL (ref 3.87–5.11)
RDW: 15.3 % (ref 11.5–15.5)
WBC: 10 10*3/uL (ref 4.0–10.5)
nRBC: 0 % (ref 0.0–0.2)

## 2020-10-03 LAB — D-DIMER, QUANTITATIVE: D-Dimer, Quant: 0.35 ug/mL-FEU (ref 0.00–0.50)

## 2020-10-03 MED ORDER — ALUM & MAG HYDROXIDE-SIMETH 200-200-20 MG/5ML PO SUSP
30.0000 mL | Freq: Once | ORAL | Status: AC
  Administered 2020-10-03: 30 mL via ORAL
  Filled 2020-10-03: qty 30

## 2020-10-03 MED ORDER — BENZONATATE 100 MG PO CAPS: 100.0000 mg | ORAL_CAPSULE | Freq: Three times a day (TID) | ORAL | 0 refills | Status: DC

## 2020-10-03 MED ORDER — OMEPRAZOLE 20 MG PO CPDR: 20.0000 mg | DELAYED_RELEASE_CAPSULE | Freq: Every day | ORAL | 0 refills | Status: DC

## 2020-10-03 MED ORDER — PREDNISONE 10 MG PO TABS: 20.0000 mg | ORAL_TABLET | Freq: Every day | ORAL | 0 refills | Status: DC

## 2020-10-03 NOTE — Discharge Instructions (Addendum)
I suspect you have a viral upper respiratory tract infection.  You can take the [facility] every 8 hours as needed to help with coughing.  If your breathing becomes strained as we discussed with an asthma exacerbation, please take 40 mg of the prednisone for 5 days.  Continue to take Tylenol as needed for body aches.  Continue to drink lots of fluids and stay hydrated.  You may return back to work on Wednesday for COVID.  As far as the acid reflux, continue to take your Pepcid as prescribed.  I am also writing your prescription for Prilosec, which is a proton pump inhibitor.  Please take this medicine 30 minutes before your first meal of the day for the next 4 weeks.  This will help reduce acid in your throat.  Will take about a week before you start noticing improvement.

## 2020-10-03 NOTE — ED Notes (Addendum)
Ambulated from lobby to triage, SpO2 98-100. HR 115-120, BBS CTA, cough with blood tinged sputum.  No DOE. Patient Rx for SABA expired.

## 2020-10-03 NOTE — ED Triage Notes (Signed)
Pt c/o cough, body aches, sore throat, shortness of breath since yesterday. Noted blood in sputum. Took home covid test, was negative.

## 2020-10-03 NOTE — ED Provider Notes (Signed)
[facility] EMERGENCY DEPARTMENT Provider Note   CSN: [id number] Arrival date & time: 10/03/20  1004     History Chief Complaint  Patient presents with   Cough    Brianna Byrd is a 25 y.o. female.  Patient presents with cough x3 days.  Started as a dry cough, but this morning she had 1 episode of hemoptysis.  She has been having body aches as well as congestion.  States she has tried ibuprofen without relief.  She also reports having a sore throat.  She is COVID vaccinated and boosted.  No known sick contacts, but works in the [employer].  She is not having any chest pain, although she does have some associated shortness of breath.  She has asthma and has been using her rescue inhaler.    Past Medical History:  Diagnosis Date   Asthma    Obesity    Reflux     Patient Active Problem List   Diagnosis Date Noted   Witnessed apneic spells 08/30/2020   Chronic tension-type headache 07/28/2020   Daytime somnolence 07/28/2020   Obesity 07/28/2020   Vitamin B12 deficiency 07/28/2020   Neck pain on left side 12/21/2019   Musculoskeletal pain 12/21/2019   Abdominal bloating 07/30/2019   Heartburn 07/30/2019   Migraine without aura and without status migrainosus, not intractable 03/04/2019   Left breast mass 01/27/2019   Amenorrhea 08/26/2017   Goiter diffuse 08/26/2017   History of pre-eclampsia 02/12/2017   SVD (spontaneous vaginal delivery) 12/26/2016   Psychophysiologic insomnia 12/21/2016   Trichomonal vaginitis during pregnancy, antepartum 06/05/2016   Pregnant 05/14/2016   Constitutional short stature 05/14/2016    Past Surgical History:  Procedure Laterality Date   NO PAST SURGERIES     UPPER GASTROINTESTINAL ENDOSCOPY  08/28/2019     OB History     Gravida  1   Para  1   Term  1   Preterm      AB      Living  1      SAB      IAB      Ectopic      Multiple  0   Live Births  1           Family History  Problem  Relation Age of Onset   Diabetes Father    Hypertension Mother    Diabetes Sister    Colon cancer Cousin    Rectal cancer Cousin    Cancer Neg Hx    Colon polyps Neg Hx    Esophageal cancer Neg Hx    Stomach cancer Neg Hx     Social History   Tobacco Use   Smoking status: Never   Smokeless tobacco: Never  Vaping Use   Vaping Use: Never used  Substance Use Topics   Alcohol use: No   Drug use: No    Home Medications Prior to Admission medications   Medication Sig Start Date End Date Taking? Authorizing Provider  Cyanocobalamin (VITAMIN B-12) 5000 MCG SUBL Place 1 tablet (5,000 mcg total) under the tongue daily. 07/28/20 10/26/20  Delano Metz, MD  fluconazole (DIFLUCAN) 150 MG tablet Take one tablet by mouth once. Repeat in 3 days if symptoms persist. Patient not taking: Reported on 09/22/2020 07/12/20   Willodean Rosenthal, MD  meclizine (ANTIVERT) 25 MG tablet Take 1 tablet (25 mg total) by mouth 3 (three) times daily as needed for dizziness. 09/07/20   Terrilee Files, MD  metroNIDAZOLE (FLAGYL) 500  MG tablet Take 1 tablet (500 mg total) by mouth 2 (two) times daily. Patient not taking: Reported on 09/22/2020 07/15/20   Merrilee Jansky, MD  nitrofurantoin, macrocrystal-monohydrate, (MACROBID) 100 MG capsule Take 1 capsule (100 mg total) by mouth 2 (two) times daily. 09/22/20   Levie Heritage, DO  esomeprazole (NEXIUM) 40 MG capsule Take 1 capsule (40 mg total) by mouth daily before breakfast. 08/28/19 05/18/20  Mansouraty, Netty Starring., MD  famotidine (PEPCID) 20 MG tablet Take 1 tablet (20 mg total) by mouth 2 (two) times daily. 10/24/19 05/18/20  Renne Crigler, PA-C  fluticasone (FLONASE) 50 MCG/ACT nasal spray Place 2 sprays into both nostrils daily. Patient not taking: Reported on 01/27/2019 06/05/18 05/01/19  Palumbo, April, MD  medroxyPROGESTERone (PROVERA) 10 MG tablet Take 1 tablet (10 mg total) by mouth daily for 7 days. 03/04/19 05/01/19  Sharlene Dory, DO   SUMAtriptan (IMITREX) 100 MG tablet Take 1 tablet (100 mg total) by mouth every 2 (two) hours as needed for migraine. May repeat in 2 hours if headache persists or recurs. Patient not taking: Reported on 03/27/2019 03/04/19 05/01/19  Sharlene Dory, DO    Allergies    Peach [prunus persica]  Review of Systems   Review of Systems  Constitutional:  Positive for fever.  HENT:  Positive for sore throat.   Respiratory:  Positive for cough and shortness of breath. Negative for wheezing.   Cardiovascular:  Negative for chest pain.  Gastrointestinal:  Negative for nausea and vomiting.   Physical Exam Updated Vital Signs BP 120/75 (BP Location: Right Arm)   Pulse (!) 109   Temp 99.5 F (37.5 C) (Oral)   Resp 18   Ht 5\' 1"  (1.549 m)   Wt 84.8 kg   LMP 09/09/2020   SpO2 100%   BMI 35.33 kg/m   Physical Exam Vitals and nursing note reviewed.  Constitutional:      General: She is not in acute distress.    Appearance: She is well-developed.  HENT:     Head: Normocephalic and atraumatic.  Eyes:     Conjunctiva/sclera: Conjunctivae normal.  Cardiovascular:     Rate and Rhythm: Regular rhythm. Tachycardia present.     Heart sounds: No murmur heard. Pulmonary:     Effort: Pulmonary effort is normal. No respiratory distress.     Breath sounds: Normal breath sounds.     Comments: Lungs are clear to auscultation bilaterally.  No expiratory wheezes with forced exhalation. Abdominal:     Palpations: Abdomen is soft.     Tenderness: There is no abdominal tenderness.  Musculoskeletal:     Cervical back: Neck supple.     Comments: Note no unilateral leg swelling.  Pulses DP and PT are 2+ bilaterally.  Skin:    General: Skin is warm and dry.  Neurological:     Mental Status: She is alert.    ED Results / Procedures / Treatments   Labs (all labs ordered are listed, but only abnormal results are displayed) Labs Reviewed - No data to display  EKG None  Radiology No results  found.  Procedures Procedures   Medications Ordered in ED Medications - No data to display  ED Course  I have reviewed the triage vital signs and the nursing notes.  Pertinent labs & imaging results that were available during my care of the patient were reviewed by me and considered in my medical decision making (see chart for details).  Clinical Course as of 10/03/20  1733  Mon Oct 03, 2020  1219 D-dimer, quantitative Doubt PE [HS]  1219 Patient heart rate has decreased to 91.  Her vitals are stable.  She is not in any acute pain. [HS]  1219 Resp Panel by RT-PCR (Flu A&B, Covid) Nasopharyngeal Swab COVID and flu negative.  Will not prescribe Paxilovid. [HS]  1219 CBC with Differential(!) Patient mildly anemic.  Per chart review, this seems to be a stable finding.  Will discuss with the patient.  No leukocytosis concerning for infectious or inflammatory etiology [HS]  1220 Basic metabolic panel(!) No electrolyte derangement or AKI. [HS]    Clinical Course User Index [HS] Theron Arista, PA-C   MDM Rules/Calculators/A&P                          Suspect patient has a viral URI.  We will also get a chest x-ray to evaluate for possible pneumonia.  1 episode of hemoptysis likely due to a weekend of forced coughing.  There is no unilateral leg swelling or chest pain, although she does have somewhat tachycardic I suspect this is due to a fever.  Her oral temperature is 99.5, I suspect she is actually febrile if we confirm that the rectal temperature which I do not think is necessary.  Chest x-ray is negative for pneumonia, pleural effusion, PTX.  I suspect this is an upper respiratory illness.  D-dimer and basic labs were ordered given she was tachycardic on arrival.  D-dimer was negative, I do not have a high suspicion for PE at this time.    I will prescribe her steroids as needed in case she has an asthma exacerbation on top of it.  We discussed reasons to take the prednisone.  Discussed  Paxil Oved if the COVID test results positive, patient is not interested at this time.  Return precautions discussed including severe onset of chest pain, inability to keep down fluids or solids, unilateral leg swelling.    Discussed HPI, physical exam and plan of care for this patient with attending R Dykstra. The attending physician evaluated this patient as part of a shared visit and agrees with plan of care.   Final Clinical Impression(s) / ED Diagnoses Final diagnoses:  None    Rx / DC Orders ED Discharge Orders     None        Theron Arista, [place] 10/03/20 1734    Milagros Loll, MD 10/05/20 1547

## 2020-10-04 ENCOUNTER — Ambulatory Visit: Payer: No Typology Code available for payment source | Admitting: Physical Therapy

## 2020-10-07 ENCOUNTER — Ambulatory Visit: Payer: No Typology Code available for payment source

## 2020-10-11 ENCOUNTER — Ambulatory Visit: Payer: No Typology Code available for payment source

## 2020-10-14 ENCOUNTER — Ambulatory Visit: Payer: No Typology Code available for payment source

## 2020-10-14 ENCOUNTER — Telehealth: Payer: Self-pay

## 2020-10-14 NOTE — Telephone Encounter (Signed)
Called patient and left VM. Patient has had 4 consecutive cxl/no shows since her initial evaluations. Reminded patient that we will be cancelling her remaining appts as per our departmental policy. If she wishes to continue, we will have to perform re-evaluation.  Lavone Nian, PT 10/14/20, [phone]

## 2020-10-18 ENCOUNTER — Ambulatory Visit: Payer: No Typology Code available for payment source | Admitting: Physical Therapy

## 2020-10-21 ENCOUNTER — Ambulatory Visit: Payer: No Typology Code available for payment source

## 2020-10-25 ENCOUNTER — Ambulatory Visit: Payer: No Typology Code available for payment source | Admitting: Physical Therapy

## 2020-10-28 ENCOUNTER — Ambulatory Visit: Payer: No Typology Code available for payment source

## 2020-11-01 ENCOUNTER — Ambulatory Visit: Payer: No Typology Code available for payment source | Admitting: Physical Therapy

## 2020-11-04 ENCOUNTER — Ambulatory Visit: Payer: No Typology Code available for payment source

## 2020-11-16 NOTE — Progress Notes (Deleted)
NEUROLOGY CONSULTATION NOTE  Melrose Kearse MRN: [id number] DOB: 04-17-1995  Referring provider: Roslynn Amble, PA Primary care provider: Roslynn Amble, PA  Reason for consult:  headache  Assessment/Plan:   ***   Subjective:  Brianna Byrd is a 25 year old female with asthma who presents for headaches.  History supplemented by referring provider's note.  Daily headaches started around April 2022.  Reports ***.  She reports blurred vision, possibly her left eye, for 30 to 60 minutes.  There is also associated nausea and photophobia.  No vomiting, phonophobia, autonomic symptoms, numbness or weakness.  These headaches last ***.  She also had left sided neck pain with numbness down the left arm.   At the same time, she reports severe paroxysmal frontal stabbing pain.that ***.  If she lays down for too long, she feels dizzy, ***.  She also notes tremors in her hands and feet.  She also reports memory issues.    She saw ophthalmology ***.  She had a sleep study ***  Labs from May revealed low vitamin B12 of 88.  ***.  CBC revealed microcytic anemia.  TSH was normal at 0.68; CMP was unremarkable; Hgb A1c was 5.6.  MRI of brain and cervical spine with and without contrast on 08/05/2020 personally reviewed were normal.  She has recently lost a lot of weight ***.  She is currently on a strict diet without red meat.  No family history of neurologic conditions.    Current NSAIDS/analgesics:  *** Current triptans:  *** Current ergotamine:  *** Current anti-emetic:  *** Current muscle relaxants:  *** Current Antihypertensive medications:  *** Current Antidepressant medications:  *** Current Anticonvulsant medications:  *** Current anti-CGRP:  *** Current Vitamins/Herbal/Supplements:  *** Current Antihistamines/Decongestants:  *** Other therapy:  *** Hormone/birth control:  *** Other medications:  ***  Past NSAIDS/analgesics:  *** Past abortive triptans:  *** Past abortive  ergotamine:  *** Past muscle relaxants:  *** Past anti-emetic:  *** Past antihypertensive medications:  *** Past antidepressant medications:  *** Past anticonvulsant medications:  *** Past anti-CGRP:  *** Past vitamins/Herbal/Supplements:  *** Past antihistamines/decongestants:  *** Other past therapies:  ***       PAST MEDICAL HISTORY: Past Medical History:  Diagnosis Date   Asthma    Obesity    Reflux     PAST SURGICAL HISTORY: Past Surgical History:  Procedure Laterality Date   NO PAST SURGERIES     UPPER GASTROINTESTINAL ENDOSCOPY  08/28/2019    MEDICATIONS: Current Outpatient Medications on File Prior to Visit  Medication Sig Dispense Refill   benzonatate (TESSALON) 100 MG capsule Take 1 capsule (100 mg total) by mouth every 8 (eight) hours. 21 capsule 0   Cyanocobalamin (VITAMIN B-12) 5000 MCG SUBL Place 1 tablet (5,000 mcg total) under the tongue daily. 90 tablet 0   fluconazole (DIFLUCAN) 150 MG tablet Take one tablet by mouth once. Repeat in 3 days if symptoms persist. (Patient not taking: Reported on 09/22/2020) 1 tablet 1   meclizine (ANTIVERT) 25 MG tablet Take 1 tablet (25 mg total) by mouth 3 (three) times daily as needed for dizziness. 30 tablet 0   metroNIDAZOLE (FLAGYL) 500 MG tablet Take 1 tablet (500 mg total) by mouth 2 (two) times daily. (Patient not taking: Reported on 09/22/2020) 14 tablet 0   nitrofurantoin, macrocrystal-monohydrate, (MACROBID) 100 MG capsule Take 1 capsule (100 mg total) by mouth 2 (two) times daily. 10 capsule 0   omeprazole (PRILOSEC) 20 MG capsule Take 1 capsule (  20 mg total) by mouth daily. 30 capsule 0   predniSONE (DELTASONE) 10 MG tablet Take 2 tablets (20 mg total) by mouth daily. 20 tablet 0   [DISCONTINUED] esomeprazole (NEXIUM) 40 MG capsule Take 1 capsule (40 mg total) by mouth daily before breakfast. 30 capsule 2   [DISCONTINUED] famotidine (PEPCID) 20 MG tablet Take 1 tablet (20 mg total) by mouth 2 (two) times daily. 30  tablet 0   [DISCONTINUED] fluticasone (FLONASE) 50 MCG/ACT nasal spray Place 2 sprays into both nostrils daily. (Patient not taking: Reported on 01/27/2019) 16 g 0   [DISCONTINUED] medroxyPROGESTERone (PROVERA) 10 MG tablet Take 1 tablet (10 mg total) by mouth daily for 7 days. 7 tablet 0   [DISCONTINUED] SUMAtriptan (IMITREX) 100 MG tablet Take 1 tablet (100 mg total) by mouth every 2 (two) hours as needed for migraine. May repeat in 2 hours if headache persists or recurs. (Patient not taking: Reported on 03/27/2019) 10 tablet 0   No current facility-administered medications on file prior to visit.    ALLERGIES: Allergies  Allergen Reactions   Peach [Prunus Persica] Swelling    FAMILY HISTORY: Family History  Problem Relation Age of Onset   Diabetes Father    Hypertension Mother    Diabetes Sister    Colon cancer Cousin    Rectal cancer Cousin    Cancer Neg Hx    Colon polyps Neg Hx    Esophageal cancer Neg Hx    Stomach cancer Neg Hx     Objective:  *** General: No acute distress.  Patient appears well-groomed.   Head:  Normocephalic/atraumatic Eyes:  fundi examined but not visualized Neck: supple, no paraspinal tenderness, full range of motion Back: No paraspinal tenderness Heart: regular rate and rhythm Lungs: Clear to auscultation bilaterally. Vascular: No carotid bruits. Neurological Exam: Mental status: alert and oriented to person, place, and time, recent and remote memory intact, fund of knowledge intact, attention and concentration intact, speech fluent and not dysarthric, language intact. Cranial nerves: CN I: not tested CN II: pupils equal, round and reactive to light, visual fields intact CN III, IV, VI:  full range of motion, no nystagmus, no ptosis CN V: facial sensation intact. CN VII: upper and lower face symmetric CN VIII: hearing intact CN IX, X: gag intact, uvula midline CN XI: sternocleidomastoid and trapezius muscles intact CN XII: tongue  midline Bulk & Tone: normal, no fasciculations. Motor:  muscle strength 5/5 throughout Sensation:  Pinprick, temperature and vibratory sensation intact. Deep Tendon Reflexes:  2+ throughout,  toes downgoing.   Finger to nose testing:  Without dysmetria.   Heel to shin:  Without dysmetria.   Gait:  Normal station and stride.  Romberg negative.    Thank you for allowing me to take part in the care of this patient.  Shon Millet, DO  CC: ***

## 2020-11-17 ENCOUNTER — Ambulatory Visit: Payer: No Typology Code available for payment source | Admitting: Neurology

## 2020-11-28 ENCOUNTER — Other Ambulatory Visit (HOSPITAL_COMMUNITY)
Admission: RE | Admit: 2020-11-28 | Discharge: 2020-11-28 | Disposition: A | Discharge status: Home / Self Care | Payer: No Typology Code available for payment source | Source: Ambulatory Visit | Attending: Obstetrics & Gynecology | Admitting: Obstetrics & Gynecology

## 2020-11-28 ENCOUNTER — Ambulatory Visit (INDEPENDENT_AMBULATORY_CARE_PROVIDER_SITE_OTHER): Payer: No Typology Code available for payment source

## 2020-11-28 ENCOUNTER — Other Ambulatory Visit: Payer: Self-pay

## 2020-11-28 VITALS — BP 114/64 | HR 78 | Wt 190.0 lb

## 2020-11-28 DIAGNOSIS — R1031 Right lower quadrant pain: Secondary | ICD-10-CM | POA: Diagnosis not present

## 2020-11-28 DIAGNOSIS — N898 Other specified noninflammatory disorders of vagina: Secondary | ICD-10-CM

## 2020-11-28 LAB — POCT URINALYSIS DIPSTICK
Bilirubin, UA: NEGATIVE
Blood, UA: NEGATIVE
Glucose, UA: NEGATIVE
Ketones, UA: NEGATIVE
Nitrite, UA: NEGATIVE
Protein, UA: NEGATIVE
Spec Grav, UA: 1.015 (ref 1.010–1.025)
Urobilinogen, UA: 0.2 E.U./dL
pH, UA: 7.5 (ref 5.0–8.0)

## 2020-11-28 NOTE — Progress Notes (Addendum)
SUBJECTIVE:  25 y.o. female complains of yellow vaginal discharge for 1 week(s). Denies abnormal vaginal bleeding or significant pelvic pain or fever. No UTI symptoms. Denies history of known exposure to STD.  No LMP recorded. (Menstrual status: Irregular Periods).  OBJECTIVE:  She appears well, afebrile. Urine dipstick: negative for all components.  ASSESSMENT:  Vaginal Discharge  Vaginal Odor   PLAN:  GC, chlamydia, trichomonas, BVAG, CVAG probe sent to lab. Treatment: To be determined once lab results are received ROV prn if symptoms persist or worsen.   Attestation of Attending Supervision of CMA/RN: Evaluation and management procedures were performed by the nurse under my supervision and collaboration.  I have reviewed the nursing note and chart, and I agree with the management and plan.  Carolyn L. Harraway-Smith, M.D., [place]

## 2020-11-30 ENCOUNTER — Telehealth: Payer: Self-pay

## 2020-11-30 LAB — CERVICOVAGINAL ANCILLARY ONLY
Bacterial Vaginitis (gardnerella): NEGATIVE
Candida Glabrata: NEGATIVE
Candida Vaginitis: NEGATIVE
Chlamydia: NEGATIVE
Comment: NEGATIVE
Comment: NEGATIVE
Comment: NEGATIVE
Comment: NEGATIVE
Comment: NEGATIVE
Comment: NORMAL
Neisseria Gonorrhea: POSITIVE — AB
Trichomonas: NEGATIVE

## 2020-11-30 NOTE — Telephone Encounter (Signed)
-----   Message from Marti Sleigh, [place] sent at 11/30/2020  3:18 PM EDT ----- Regarding: Results Would like test results.

## 2020-11-30 NOTE — Telephone Encounter (Signed)
Called pt to discuss results. Pt made aware that she tested positive for Gonorrhea. Pt advised to not have sex for 7-10 days after she and partner have been treated. STD form will be faxed to [facility] [address]. Cedric Denison l Iness Pangilinan, CMA

## 2020-12-01 ENCOUNTER — Other Ambulatory Visit: Payer: Self-pay

## 2020-12-01 ENCOUNTER — Encounter: Payer: Self-pay | Admitting: General Practice

## 2020-12-01 ENCOUNTER — Ambulatory Visit (INDEPENDENT_AMBULATORY_CARE_PROVIDER_SITE_OTHER): Payer: No Typology Code available for payment source

## 2020-12-01 VITALS — Wt 189.0 lb

## 2020-12-01 DIAGNOSIS — A549 Gonococcal infection, unspecified: Secondary | ICD-10-CM

## 2020-12-01 DIAGNOSIS — A5402 Gonococcal vulvovaginitis, unspecified: Secondary | ICD-10-CM | POA: Diagnosis not present

## 2020-12-01 MED ORDER — CEFTRIAXONE SODIUM 500 MG IJ SOLR
500.0000 mg | Freq: Once | INTRAMUSCULAR | Status: AC
  Administered 2020-12-01: 500 mg via INTRAMUSCULAR

## 2020-12-01 NOTE — Progress Notes (Signed)
Patient presents for treatment of Gonorrhea. Rocephin injection given and patient to return in one month for test of cure. Patient to make appointment before leaving office.Also made sure patient aware to abstain from any intercourse until her partner has been tested. Patient states understanding.  Armandina Stammer RN

## 2020-12-13 ENCOUNTER — Encounter (HOSPITAL_BASED_OUTPATIENT_CLINIC_OR_DEPARTMENT_OTHER): Payer: Self-pay

## 2020-12-13 ENCOUNTER — Other Ambulatory Visit: Payer: Self-pay

## 2020-12-13 NOTE — ED Triage Notes (Signed)
Pt reports low back pain that started this afternoon  -  denies trauma.   Came in  ambulatory

## 2020-12-14 ENCOUNTER — Other Ambulatory Visit: Payer: Self-pay | Admitting: Obstetrics & Gynecology

## 2020-12-19 ENCOUNTER — Other Ambulatory Visit: Payer: Self-pay

## 2020-12-19 ENCOUNTER — Ambulatory Visit (INDEPENDENT_AMBULATORY_CARE_PROVIDER_SITE_OTHER): Payer: No Typology Code available for payment source

## 2020-12-19 ENCOUNTER — Other Ambulatory Visit (HOSPITAL_COMMUNITY)
Admission: RE | Admit: 2020-12-19 | Discharge: 2020-12-19 | Disposition: A | Discharge status: Home / Self Care | Payer: No Typology Code available for payment source | Source: Ambulatory Visit | Attending: Obstetrics & Gynecology | Admitting: Obstetrics & Gynecology

## 2020-12-19 VITALS — BP 103/73 | HR 81 | Wt 189.0 lb

## 2020-12-19 DIAGNOSIS — Z8619 Personal history of other infectious and parasitic diseases: Secondary | ICD-10-CM | POA: Diagnosis present

## 2020-12-19 NOTE — Progress Notes (Addendum)
Pt presents for TOC for Gonorrhea. Self swab was sent to the lab. Naheim Burgen l Jowan Skillin, CMA   Attestation of Attending Supervision of CMA/RN: Evaluation and management procedures were performed by the nurse under my supervision and collaboration.  I have reviewed the nursing note and chart, and I agree with the management and plan.  Carolyn L. Harraway-Smith, M.D., [place]

## 2020-12-20 LAB — CERVICOVAGINAL ANCILLARY ONLY
Chlamydia: NEGATIVE
Comment: NEGATIVE
Comment: NORMAL
Neisseria Gonorrhea: NEGATIVE

## 2020-12-26 ENCOUNTER — Encounter (HOSPITAL_BASED_OUTPATIENT_CLINIC_OR_DEPARTMENT_OTHER): Payer: Self-pay | Admitting: *Deleted

## 2020-12-26 ENCOUNTER — Other Ambulatory Visit: Payer: Self-pay

## 2020-12-26 ENCOUNTER — Emergency Department (HOSPITAL_BASED_OUTPATIENT_CLINIC_OR_DEPARTMENT_OTHER)
Admission: EM | Admit: 2020-12-26 | Discharge: 2020-12-27 | Disposition: A | Discharge status: Home / Self Care | Payer: No Typology Code available for payment source | Attending: Emergency Medicine | Admitting: Emergency Medicine

## 2020-12-26 DIAGNOSIS — M546 Pain in thoracic spine: Secondary | ICD-10-CM | POA: Diagnosis not present

## 2020-12-26 DIAGNOSIS — K219 Gastro-esophageal reflux disease without esophagitis: Secondary | ICD-10-CM | POA: Diagnosis not present

## 2020-12-26 DIAGNOSIS — J45909 Unspecified asthma, uncomplicated: Secondary | ICD-10-CM | POA: Insufficient documentation

## 2020-12-26 DIAGNOSIS — R0789 Other chest pain: Secondary | ICD-10-CM

## 2020-12-26 DIAGNOSIS — Z7951 Long term (current) use of inhaled steroids: Secondary | ICD-10-CM | POA: Insufficient documentation

## 2020-12-26 DIAGNOSIS — M542 Cervicalgia: Secondary | ICD-10-CM | POA: Insufficient documentation

## 2020-12-26 NOTE — ED Triage Notes (Signed)
C/o left chest/ shoulder pain with movt x 2 days

## 2020-12-27 MED ORDER — CYCLOBENZAPRINE HCL 10 MG PO TABS: 10.0000 mg | ORAL_TABLET | Freq: Three times a day (TID) | ORAL | 0 refills | Status: DC | PRN

## 2020-12-27 MED ORDER — CELECOXIB 100 MG PO CAPS: 100.0000 mg | ORAL_CAPSULE | Freq: Two times a day (BID) | ORAL | 0 refills | Status: DC

## 2020-12-27 MED ORDER — SIMETHICONE 80 MG PO CHEW: 80.0000 mg | CHEWABLE_TABLET | Freq: Four times a day (QID) | ORAL | 0 refills | Status: DC | PRN

## 2020-12-27 NOTE — Discharge Instructions (Signed)
Apply ice for 30 minutes at a time, 4 times a day.  You may add acetaminophen for additional pain relief.  Acetaminophen and celecoxib work on pain in different ways, and the combination gives you better pain relief than either medication by itself.  You may increase your medication for acid reflux to twice a day for the next 2 weeks.

## 2020-12-27 NOTE — ED Provider Notes (Signed)
[facility] EMERGENCY DEPARTMENT Provider Note   CSN: [id number] Arrival date & time: 12/26/20  2327     History Chief Complaint  Patient presents with   chest wall pain     Brianna Byrd is a 25 y.o. female.  The history is provided by the patient.  She has history of asthma, GERD and comes in complaining of pain in the left side of her chest, left upper back, left side of her neck for the last 2 days.  She denies any antecedent trauma or unusual bending or lifting.  Pain is worse with movement.  She rates pain at 8/10.  During the same time, she has noted some increased belching in spite of the fact she has been taking her acid reflux medication.  She has tried taking ibuprofen for pain without relief.  She also took a dose of cyclobenzaprine 5mg  without relief.  She denies any dyspnea, nausea, diaphoresis.   Past Medical History:  Diagnosis Date   Asthma    Obesity    Reflux     Patient Active Problem List   Diagnosis Date Noted   Witnessed apneic spells 08/30/2020   Chronic tension-type headache 07/28/2020   Daytime somnolence 07/28/2020   Obesity 07/28/2020   Vitamin B12 deficiency 07/28/2020   Neck pain on left side 12/21/2019   Musculoskeletal pain 12/21/2019   Abdominal bloating 07/30/2019   Heartburn 07/30/2019   Migraine without aura and without status migrainosus, not intractable 03/04/2019   Left breast mass 01/27/2019   Amenorrhea 08/26/2017   Goiter diffuse 08/26/2017   History of pre-eclampsia 02/12/2017   SVD (spontaneous vaginal delivery) 12/26/2016   Psychophysiologic insomnia 12/21/2016   Trichomonal vaginitis during pregnancy, antepartum 06/05/2016   Pregnant 05/14/2016   Constitutional short stature 05/14/2016    Past Surgical History:  Procedure Laterality Date   NO PAST SURGERIES     UPPER GASTROINTESTINAL ENDOSCOPY  08/28/2019     OB History     Gravida  1   Para  1   Term  1   Preterm      AB      Living  1       SAB      IAB      Ectopic      Multiple  0   Live Births  1           Family History  Problem Relation Age of Onset   Diabetes Father    Hypertension Mother    Diabetes Sister    Colon cancer Cousin    Rectal cancer Cousin    Cancer Neg Hx    Colon polyps Neg Hx    Esophageal cancer Neg Hx    Stomach cancer Neg Hx     Social History   Tobacco Use   Smoking status: Never   Smokeless tobacco: Never  Vaping Use   Vaping Use: Never used  Substance Use Topics   Alcohol use: No   Drug use: No    Home Medications Prior to Admission medications   Medication Sig Start Date End Date Taking? Authorizing Provider  benzonatate (TESSALON) 100 MG capsule Take 1 capsule (100 mg total) by mouth every 8 (eight) hours. Patient not taking: No sig reported 10/03/20   10/05/20, PA-C  Cyanocobalamin (VITAMIN B-12) 5000 MCG SUBL Place 1 tablet (5,000 mcg total) under the tongue daily. 07/28/20 10/26/20  12/26/20, MD  fluconazole (DIFLUCAN) 150 MG tablet Take one tablet by mouth  once. Repeat in 3 days if symptoms persist. Patient not taking: No sig reported 07/12/20   Willodean Rosenthal, MD  meclizine (ANTIVERT) 25 MG tablet Take 1 tablet (25 mg total) by mouth 3 (three) times daily as needed for dizziness. 09/07/20   Terrilee Files, MD  metroNIDAZOLE (FLAGYL) 500 MG tablet Take 1 tablet (500 mg total) by mouth 2 (two) times daily. Patient not taking: No sig reported 07/15/20   Merrilee Jansky, MD  nitrofurantoin, macrocrystal-monohydrate, (MACROBID) 100 MG capsule Take 1 capsule (100 mg total) by mouth 2 (two) times daily. Patient not taking: No sig reported 09/22/20   Levie Heritage, DO  omeprazole (PRILOSEC) 20 MG capsule Take 1 capsule (20 mg total) by mouth daily. 10/03/20   Theron Arista, PA-C  predniSONE (DELTASONE) 10 MG tablet Take 2 tablets (20 mg total) by mouth daily. Patient not taking: No sig reported 10/03/20   Theron Arista, PA-C  esomeprazole  (NEXIUM) 40 MG capsule Take 1 capsule (40 mg total) by mouth daily before breakfast. 08/28/19 05/18/20  Mansouraty, Netty Starring., MD  famotidine (PEPCID) 20 MG tablet Take 1 tablet (20 mg total) by mouth 2 (two) times daily. 10/24/19 05/18/20  Renne Crigler, PA-C  fluticasone (FLONASE) 50 MCG/ACT nasal spray Place 2 sprays into both nostrils daily. Patient not taking: Reported on 01/27/2019 06/05/18 05/01/19  Palumbo, April, MD  medroxyPROGESTERone (PROVERA) 10 MG tablet Take 1 tablet (10 mg total) by mouth daily for 7 days. 03/04/19 05/01/19  Sharlene Dory, DO  SUMAtriptan (IMITREX) 100 MG tablet Take 1 tablet (100 mg total) by mouth every 2 (two) hours as needed for migraine. May repeat in 2 hours if headache persists or recurs. Patient not taking: Reported on 03/27/2019 03/04/19 05/01/19  Sharlene Dory, DO    Allergies    Peach [prunus persica]  Review of Systems   Review of Systems  All other systems reviewed and are negative.  Physical Exam Updated Vital Signs BP 112/81   Pulse 91   Temp 98.6 F (37 C) (Oral)   Resp 16   Ht 5\' 1"  (1.549 m)   Wt 86.2 kg   LMP 12/04/2020 (Exact Date)   SpO2 100%   BMI 35.90 kg/m   Physical Exam Vitals and nursing note reviewed.  25 year old female, resting comfortably and in no acute distress. Vital signs are normal. Oxygen saturation is 100%, which is normal. Head is normocephalic and atraumatic. PERRLA, EOMI. Oropharynx is clear. Neck is supple without adenopathy or JVD.  There is mild tenderness to palpation over the soft tissues of the left side of the neck. Back is mildly tender in the left thoracic area.  There is no crepitus.  There is no CVA tenderness. Lungs are clear without rales, wheezes, or rhonchi. Chest is mildly tender in the left anterior chest wall.  There is no crepitus.  Pain is also reproduced by both active and passive range of motion of the left shoulder. Heart has regular rate and rhythm without murmur. Abdomen  is soft, flat, nontender without masses or hepatosplenomegaly and peristalsis is normoactive. Extremities have no cyanosis or edema, full range of motion is present. Skin is warm and dry without rash. Neurologic: Mental status is normal, cranial nerves are intact, there are no motor or sensory deficits.  ED Results / Procedures / Treatments    Procedures Procedures   Medications Ordered in ED Medications - No data to display  ED Course  I have reviewed the triage  vital signs and the nursing notes.  MDM Rules/Calculators/A&P                         Left-sided chest, upper back, neck pain which is clearly musculoskeletal.  No red flags to suggest more serious pathology.  Old records are reviewed, and she had an ED visit about 1 year ago for similar complaints.  Increased belching possibly secondary to exacerbation of GERD.  She is advised to increase her proton pump inhibitor to twice a day.  She is advised not to use ibuprofen or naproxen and she is given a prescription for celecoxib for an NSAID that would have less GI upset.  She is requesting medication for gas, she is given a prescription for simethicone.  Also given a prescription for a higher dose of cyclobenzaprine.  Advised to use ice, follow-up with PCP.  Final Clinical Impression(s) / ED Diagnoses Final diagnoses:  Musculoskeletal chest pain  GERD without esophagitis    Rx / DC Orders ED Discharge Orders          Ordered    simethicone (GAS-X) 80 MG chewable tablet  Every 6 hours PRN        12/27/20 0140    celecoxib (CELEBREX) 100 MG capsule  2 times daily        12/27/20 0140    cyclobenzaprine (FLEXERIL) 10 MG tablet  3 times daily PRN        12/27/20 0140             Dione Booze, MD 12/27/20 0147

## 2020-12-27 NOTE — ED Notes (Signed)
Pt NAD, a/ox4. Pt verbalizes understanding of all DC and f/u instructions. All questions answered. Pt walks with steady gait to lobby at DC.  ? ?

## 2021-01-04 ENCOUNTER — Ambulatory Visit: Payer: No Typology Code available for payment source

## 2021-01-18 ENCOUNTER — Other Ambulatory Visit: Payer: Self-pay

## 2021-01-18 ENCOUNTER — Ambulatory Visit (INDEPENDENT_AMBULATORY_CARE_PROVIDER_SITE_OTHER): Payer: No Typology Code available for payment source

## 2021-01-18 VITALS — BP 119/84 | HR 85

## 2021-01-18 DIAGNOSIS — R300 Dysuria: Secondary | ICD-10-CM

## 2021-01-18 NOTE — Progress Notes (Signed)
SUBJECTIVE: Brianna Byrd is a 25 y.o. female who complains of urinary frequency, urgency and dysuria x 7 days, without flank pain, fever, chills, or abnormal vaginal discharge or bleeding. Patient states she was recently treated by her PCP for a UTI but her symptoms have not resolved. Patient is having dysuria and urinary frequency. Patient has been taking AZO for pain relief.   OBJECTIVE: Appears well, in no apparent distress.  Vital signs are normal. Urine dipstick shows positive for leukocytes and blood.    ASSESSMENT: Dysuria and hematuria  PLAN: Treatment per orders.  Call or return to clinic prn if these symptoms worsen or fail to improve as anticipated.

## 2021-01-18 NOTE — Progress Notes (Signed)
Chart reviewed - agree with CMA/RN documentation.  ° °

## 2021-01-24 LAB — URINE CULTURE

## 2021-01-26 ENCOUNTER — Other Ambulatory Visit: Payer: Self-pay | Admitting: Family Medicine

## 2021-01-26 MED ORDER — CEPHALEXIN 250 MG/5ML PO SUSR: 500.0000 mg | Freq: Three times a day (TID) | ORAL | 0 refills | Status: AC

## 2021-01-26 NOTE — Progress Notes (Signed)
Has UTI - keflex sent in. Oral suspension as patient has difficulty swallowing pills.

## 2021-01-26 NOTE — Progress Notes (Signed)
Patient called and made aware of UTI patient made aware that Dr. Adrian Blackwater has called in antibiotic in liquid form for her to her pharmacy. Armandina Stammer RN

## 2021-02-12 NOTE — Progress Notes (Signed)
NEUROLOGY CONSULTATION NOTE  Brianna Byrd MRN: [id number] DOB: [birth year]  Referring provider: Roslynn Amble, PA Primary care provider: No PCP  Reason for consult:  headache  Assessment/Plan:   Chronic migraine Migrainous vertigo  Migraine prevention:  topiramate 25mg  at bedtime for one week, then 50mg  at bedtime Migraine rescue:  rizatriptan 10mg  and Zofran.  May use meclizine but sparingly Limit use of pain relievers to no more than 2 days out of week to prevent risk of rebound or medication-overuse headache. Keep headache diary Follow up 6 months.    Subjective:  Brianna Byrd is a 25 year old female who presents for headache.  History supplemented by ED and referring provider's notes.  MRI of brain and cervical spine from May personally reviewed.  She began experiencing daily headaches around March 2022.  No preceding event such as head injury or illness.  She has a persistent head pressure.  When she would move her head or change positions or laying down, she would become dizzy, described as an undulating sensation.  Lasts until she falls asleep.  Initially would occur once a week, now almost daily.  Headaches are moderate to severe bitemporal throbbing with nausea , photophobia and seeing floaters.  No vomiting, phonophobia or autonomic symptoms.  They last up to 3 hours and occur 3 days a week.  No triggers or relieving factors.  She has been treating the headaches with Tylenol, Midol or ibuprofen around 3 days a week.  Treating dizziness with meclizine and Zofran.  She went to vestibular rehab but stopped after it was determined not to be BPPV.  Memory - She had a sleep study in June which revealed snoring but no OSA.  .  MRI of brain and cervical spine with and without contrast on 08/05/2020 were normal.  She had an eye exam in June which was unremarkable.  She also noted memory problems.  Labs in June demonstrated a B12 level of 88.  She was started on  supplementation.  Current NSAIDS/analgesics:  Tylenol, Midol 5 days a week Current triptans:  none Current ergotamine:  none Current anti-emetic:  Zofran Current muscle relaxants:  Flexeril Current Antihypertensive medications:  none Current Antidepressant medications:  none Current Anticonvulsant medications:  none Current anti-CGRP:  none Current Vitamins/Herbal/Supplements:  none Current Antihistamines/Decongestants:  none Other therapy:  none Hormone/birth control:  none Other medications:  none  Past NSAIDS/analgesics:  ibuprofen, naproxen Past abortive triptans:  sumatriptan 100mg  Past abortive ergotamine:  none Past muscle relaxants:  none Past anti-emetic:  none Past antihypertensive medications:  none Past antidepressant medications:  none Past anticonvulsant medications:  none Past anti-CGRP:  none Past vitamins/Herbal/Supplements:  none Past antihistamines/decongestants:  meclizine Other past therapies:  vestibular rehab  Caffeine:  1 Venti cup of coffee daily Diet:  Eats baked foods and vegetables, seafood. No fried foods or red meat.  Tries to drink at least a gallon of water a day. Exercise:  Treadmill and lift light weights. Depression:  no; Anxiety:  no Other pain:  no Sleep hygiene:  Sleep study negative for OSA. Family history of headache:  No      PAST MEDICAL HISTORY: Past Medical History:  Diagnosis Date   Asthma    Obesity    Reflux     PAST SURGICAL HISTORY: Past Surgical History:  Procedure Laterality Date   NO PAST SURGERIES     UPPER GASTROINTESTINAL ENDOSCOPY  08/28/2019    MEDICATIONS: Current Outpatient Medications on File Prior to Visit  Medication  Sig Dispense Refill   celecoxib (CELEBREX) 100 MG capsule Take 1 capsule (100 mg total) by mouth 2 (two) times daily. 30 capsule 0   Cyanocobalamin (VITAMIN B-12) 5000 MCG SUBL Place 1 tablet (5,000 mcg total) under the tongue daily. 90 tablet 0   cyclobenzaprine (FLEXERIL) 10 MG  tablet Take 1 tablet (10 mg total) by mouth 3 (three) times daily as needed for muscle spasms. 30 tablet 0   meclizine (ANTIVERT) 25 MG tablet Take 1 tablet (25 mg total) by mouth 3 (three) times daily as needed for dizziness. 30 tablet 0   omeprazole (PRILOSEC) 20 MG capsule Take 1 capsule (20 mg total) by mouth daily. 30 capsule 0   simethicone (GAS-X) 80 MG chewable tablet Chew 1 tablet (80 mg total) by mouth every 6 (six) hours as needed for flatulence. 40 tablet 0   [DISCONTINUED] esomeprazole (NEXIUM) 40 MG capsule Take 1 capsule (40 mg total) by mouth daily before breakfast. 30 capsule 2   [DISCONTINUED] famotidine (PEPCID) 20 MG tablet Take 1 tablet (20 mg total) by mouth 2 (two) times daily. 30 tablet 0   [DISCONTINUED] fluticasone (FLONASE) 50 MCG/ACT nasal spray Place 2 sprays into both nostrils daily. (Patient not taking: Reported on 01/27/2019) 16 g 0   [DISCONTINUED] medroxyPROGESTERone (PROVERA) 10 MG tablet Take 1 tablet (10 mg total) by mouth daily for 7 days. 7 tablet 0   [DISCONTINUED] SUMAtriptan (IMITREX) 100 MG tablet Take 1 tablet (100 mg total) by mouth every 2 (two) hours as needed for migraine. May repeat in 2 hours if headache persists or recurs. (Patient not taking: Reported on 03/27/2019) 10 tablet 0   No current facility-administered medications on file prior to visit.    ALLERGIES: Allergies  Allergen Reactions   Peach [Prunus Persica] Swelling    FAMILY HISTORY: Family History  Problem Relation Age of Onset   Diabetes Father    Hypertension Mother    Diabetes Sister    Colon cancer Cousin    Rectal cancer Cousin    Cancer Neg Hx    Colon polyps Neg Hx    Esophageal cancer Neg Hx    Stomach cancer Neg Hx     Objective:  Blood pressure 104/75, pulse 83, height 5\' 1"  (1.549 m), weight 194 lb 9.6 oz (88.3 kg), SpO2 100 %. General: No acute distress.  Patient appears well-groomed.   Head:  Normocephalic/atraumatic Eyes:  fundi examined but not  visualized Neck: supple, no paraspinal tenderness, full range of motion Back: No paraspinal tenderness Heart: regular rate and rhythm Lungs: Clear to auscultation bilaterally. Vascular: No carotid bruits. Neurological Exam: Mental status: alert and oriented to person, place, and time, recent and remote memory intact, fund of knowledge intact, attention and concentration intact, speech fluent and not dysarthric, language intact. Cranial nerves: CN I: not tested CN II: pupils equal, round and reactive to light, visual fields intact CN III, IV, VI:  full range of motion, no nystagmus, no ptosis CN V: facial sensation intact. CN VII: upper and lower face symmetric CN VIII: hearing intact CN IX, X: gag intact, uvula midline CN XI: sternocleidomastoid and trapezius muscles intact CN XII: tongue midline Bulk & Tone: normal, no fasciculations. Motor:  muscle strength 5/5 throughout Sensation:  Pinprick, temperature and vibratory sensation intact. Deep Tendon Reflexes:  2+ throughout,  toes downgoing.   Finger to nose testing:  Without dysmetria.   Heel to shin:  Without dysmetria.   Gait:  Normal station and stride.  Romberg  negative.    Thank you for allowing me to take part in the care of this patient.  Shon Millet, DO  CC: Roslynn Amble, PA

## 2021-02-13 ENCOUNTER — Other Ambulatory Visit: Payer: Self-pay

## 2021-02-13 ENCOUNTER — Ambulatory Visit (INDEPENDENT_AMBULATORY_CARE_PROVIDER_SITE_OTHER): Payer: No Typology Code available for payment source | Admitting: Neurology

## 2021-02-13 ENCOUNTER — Encounter: Payer: Self-pay | Admitting: Neurology

## 2021-02-13 VITALS — BP 104/75 | HR 83 | Ht 61.0 in | Wt 194.6 lb

## 2021-02-13 DIAGNOSIS — G43009 Migraine without aura, not intractable, without status migrainosus: Secondary | ICD-10-CM

## 2021-02-13 DIAGNOSIS — G43109 Migraine with aura, not intractable, without status migrainosus: Secondary | ICD-10-CM

## 2021-02-13 MED ORDER — TOPIRAMATE 50 MG PO TABS: ORAL_TABLET | ORAL | 0 refills | Status: DC

## 2021-02-13 MED ORDER — RIZATRIPTAN BENZOATE 10 MG PO TBDP: ORAL_TABLET | ORAL | 5 refills | Status: DC

## 2021-02-13 NOTE — Patient Instructions (Signed)
  Start topiramate 50mg  tablet - take 1/2 tablet at bedtime for one week, then increase to 1 tablet at bedtime.  Contact in 5 weeks with update and we can increase dose if needed. Take rizatriptan 10mg  at earliest onset of headache.  May repeat dose once in 2 hours if needed.  Maximum 2 tablets in 24 hours. Take ondansetron for nausea.  Use meclizine sparingly Limit use of pain relievers to no more than 2 days out of the week.  These medications include acetaminophen, NSAIDs (ibuprofen/Advil/Motrin, naproxen/Aleve, triptans (Imitrex/sumatriptan), Excedrin, and narcotics.  This will help reduce risk of rebound headaches. Be aware of common food triggers:  - Caffeine:  coffee, black tea, cola, Mt. Dew  - Chocolate  - Dairy:  aged cheeses (brie, blue, cheddar, gouda, [place], provolone, [place], Swiss, etc), chocolate milk, buttermilk, sour cream, limit eggs and yogurt  - Nuts, peanut butter  - Alcohol  - Cereals/grains:  FRESH breads (fresh bagels, sourdough, doughnuts), yeast productions  - Processed/canned/aged/cured meats (pre-packaged deli meats, hotdogs)  - MSG/glutamate:  soy sauce, flavor enhancer, pickled/preserved/marinated foods  - Sweeteners:  aspartame (Equal, Nutrasweet).  Sugar and Splenda are okay  - Vegetables:  legumes (lima beans, lentils, snow peas, fava beans, pinto peans, peas, garbanzo beans), sauerkraut, onions, olives, pickles  - Fruit:  avocados, bananas, citrus fruit (orange, lemon, grapefruit), mango  - Other:  Frozen meals, macaroni and cheese Routine exercise Stay adequately hydrated (aim for 64 oz water daily) Keep headache diary Maintain proper stress management Maintain proper sleep hygiene Do not skip meals Consider supplements:  magnesium citrate 400mg  daily, riboflavin 400mg  daily, coenzyme Q10 100mg  three times daily.

## 2021-03-12 ENCOUNTER — Emergency Department (HOSPITAL_BASED_OUTPATIENT_CLINIC_OR_DEPARTMENT_OTHER)
Admission: EM | Admit: 2021-03-12 | Discharge: 2021-03-12 | Disposition: A | Discharge status: Home / Self Care | Payer: No Typology Code available for payment source | Attending: Emergency Medicine | Admitting: Emergency Medicine

## 2021-03-12 ENCOUNTER — Encounter (HOSPITAL_BASED_OUTPATIENT_CLINIC_OR_DEPARTMENT_OTHER): Payer: Self-pay | Admitting: *Deleted

## 2021-03-12 ENCOUNTER — Other Ambulatory Visit: Payer: Self-pay

## 2021-03-12 DIAGNOSIS — Z7951 Long term (current) use of inhaled steroids: Secondary | ICD-10-CM | POA: Insufficient documentation

## 2021-03-12 DIAGNOSIS — H5789 Other specified disorders of eye and adnexa: Secondary | ICD-10-CM | POA: Diagnosis not present

## 2021-03-12 DIAGNOSIS — J45909 Unspecified asthma, uncomplicated: Secondary | ICD-10-CM | POA: Diagnosis not present

## 2021-03-12 MED ORDER — TETRACAINE HCL 0.5 % OP SOLN
2.0000 [drp] | Freq: Once | OPHTHALMIC | Status: AC
  Administered 2021-03-12: 11:00:00 2 [drp] via OPHTHALMIC
  Filled 2021-03-12: qty 4

## 2021-03-12 MED ORDER — ERYTHROMYCIN 5 MG/GM OP OINT: TOPICAL_OINTMENT | OPHTHALMIC | 0 refills | Status: DC

## 2021-03-12 MED ORDER — FLUORESCEIN SODIUM 1 MG OP STRP
3.0000 | ORAL_STRIP | Freq: Once | OPHTHALMIC | Status: AC
  Administered 2021-03-12: 11:00:00 3 via OPHTHALMIC
  Filled 2021-03-12: qty 3

## 2021-03-12 NOTE — ED Triage Notes (Signed)
Presents with bilateral eye irritation, decreased vision, onset last PM, states has tried warm compresses, utilizing sun glasses due to light sensitivity

## 2021-03-12 NOTE — ED Provider Notes (Addendum)
[facility] EMERGENCY DEPARTMENT Provider Note   CSN: [id number] Arrival date & time: 03/12/21  1034     History Chief Complaint  Patient presents with   Eye Problem    Rockie Basaldua is a 25 y.o. female.  25 year old female presents today for evaluation of bilateral eye pain but worse on the left eye that started around 10 PM last night.  Patient endorses photosensitivity and irritation.  She denies recent illness, drainage from the eye, fever, chills, or redness of the eyes.  Patient does wear prescription eyeglasses.  She does not wear contact lenses.  Denies pain but reports its twitching sensation.  The history is provided by the patient. No language interpreter was used.      Past Medical History:  Diagnosis Date   Asthma    Obesity    Reflux     Patient Active Problem List   Diagnosis Date Noted   Witnessed apneic spells 08/30/2020   Chronic tension-type headache 07/28/2020   Daytime somnolence 07/28/2020   Obesity 07/28/2020   Vitamin B12 deficiency 07/28/2020   Neck pain on left side 12/21/2019   Musculoskeletal pain 12/21/2019   Abdominal bloating 07/30/2019   Heartburn 07/30/2019   Migraine without aura and without status migrainosus, not intractable 03/04/2019   Left breast mass 01/27/2019   Amenorrhea 08/26/2017   Goiter diffuse 08/26/2017   History of pre-eclampsia 02/12/2017   SVD (spontaneous vaginal delivery) 12/26/2016   Psychophysiologic insomnia 12/21/2016   Trichomonal vaginitis during pregnancy, antepartum 06/05/2016   Pregnant 05/14/2016   Constitutional short stature 05/14/2016    Past Surgical History:  Procedure Laterality Date   NO PAST SURGERIES     UPPER GASTROINTESTINAL ENDOSCOPY  08/28/2019     OB History     Gravida  1   Para  1   Term  1   Preterm      AB      Living  1      SAB      IAB      Ectopic      Multiple  0   Live Births  1           Family History  Problem Relation Age  of Onset   Diabetes Father    Hypertension Mother    Diabetes Sister    Colon cancer Cousin    Rectal cancer Cousin    Cancer Neg Hx    Colon polyps Neg Hx    Esophageal cancer Neg Hx    Stomach cancer Neg Hx     Social History   Tobacco Use   Smoking status: Never   Smokeless tobacco: Never  Vaping Use   Vaping Use: Never used  Substance Use Topics   Alcohol use: No   Drug use: No    Home Medications Prior to Admission medications   Medication Sig Start Date End Date Taking? Authorizing Provider  celecoxib (CELEBREX) 100 MG capsule Take 1 capsule (100 mg total) by mouth 2 (two) times daily. Patient not taking: Reported on 02/13/2021 12/27/20   Dione Booze, MD  Cyanocobalamin (VITAMIN B-12) 5000 MCG SUBL Place 1 tablet (5,000 mcg total) under the tongue daily. 07/28/20 02/13/21  Delano Metz, MD  cyclobenzaprine (FLEXERIL) 10 MG tablet Take 1 tablet (10 mg total) by mouth 3 (three) times daily as needed for muscle spasms. 12/27/20   Dione Booze, MD  meclizine (ANTIVERT) 25 MG tablet Take 1 tablet (25 mg total) by mouth 3 (three) times  daily as needed for dizziness. Patient not taking: Reported on 02/13/2021 09/07/20   Terrilee Files, MD  omeprazole (PRILOSEC) 20 MG capsule Take 1 capsule (20 mg total) by mouth daily. 10/03/20   Theron Arista, PA-C  rizatriptan (MAXALT-MLT) 10 MG disintegrating tablet Take 1 tablet earliest onset of migraine.  May repeat in 2 hours if needed.  Maximum 2 tablets in 24 hours. 02/13/21   Drema Dallas, DO  simethicone (GAS-X) 80 MG chewable tablet Chew 1 tablet (80 mg total) by mouth every 6 (six) hours as needed for flatulence. Patient not taking: Reported on 02/13/2021 12/27/20   Dione Booze, MD  topiramate (TOPAMAX) 50 MG tablet Take 1/2 tablet at bedtime for one week, then increase to 1 tablet at bedtime 02/13/21   Drema Dallas, DO  esomeprazole (NEXIUM) 40 MG capsule Take 1 capsule (40 mg total) by mouth daily before breakfast. 08/28/19  05/18/20  Mansouraty, Netty Starring., MD  famotidine (PEPCID) 20 MG tablet Take 1 tablet (20 mg total) by mouth 2 (two) times daily. 10/24/19 05/18/20  Renne Crigler, PA-C  fluticasone (FLONASE) 50 MCG/ACT nasal spray Place 2 sprays into both nostrils daily. Patient not taking: Reported on 01/27/2019 06/05/18 05/01/19  Palumbo, April, MD  medroxyPROGESTERone (PROVERA) 10 MG tablet Take 1 tablet (10 mg total) by mouth daily for 7 days. 03/04/19 05/01/19  Sharlene Dory, DO  SUMAtriptan (IMITREX) 100 MG tablet Take 1 tablet (100 mg total) by mouth every 2 (two) hours as needed for migraine. May repeat in 2 hours if headache persists or recurs. Patient not taking: Reported on 03/27/2019 03/04/19 05/01/19  Sharlene Dory, DO    Allergies    Peach [prunus persica]  Review of Systems   Review of Systems  Constitutional:  Negative for chills and fever.  Eyes:  Positive for photophobia and pain. Negative for discharge, redness and itching.   Physical Exam Updated Vital Signs BP 115/89 (BP Location: Right Arm)    Pulse 82    Temp 98.2 F (36.8 C) (Oral)    Resp 18    Ht 5\' 1"  (1.549 m)    Wt 86.2 kg    SpO2 99%    BMI 35.90 kg/m   Physical Exam Vitals and nursing note reviewed.  Constitutional:      General: She is not in acute distress.    Appearance: Normal appearance. She is not ill-appearing.  HENT:     Head: Normocephalic and atraumatic.     Nose: Nose normal.  Eyes:     General: Lids are normal. Lids are everted, no foreign bodies appreciated. Vision grossly intact. Gaze aligned appropriately. No visual field deficit.       Right eye: No foreign body, discharge or hordeolum.        Left eye: No foreign body, discharge or hordeolum.     Intraocular pressure: Right eye pressure is 15 mmHg. Left eye pressure is 18 mmHg. Measurements were taken using an automated tonometer.    Extraocular Movements: Extraocular movements intact.     Right eye: Normal extraocular motion and no  nystagmus.     Left eye: Normal extraocular motion and no nystagmus.     Conjunctiva/sclera: Conjunctivae normal.     Right eye: Right conjunctiva is not injected. No chemosis, exudate or hemorrhage.    Left eye: Left conjunctiva is not injected. No chemosis, exudate or hemorrhage.    Comments: Woods lamp exam without corneal abrasion.  Lids everted and swept with cotton  swab to remove any potential eyelash.  Cardiovascular:     Rate and Rhythm: Normal rate.  Pulmonary:     Effort: Pulmonary effort is normal. No respiratory distress.  Musculoskeletal:        General: No deformity.  Skin:    Findings: No rash.  Neurological:     Mental Status: She is alert.    ED Results / Procedures / Treatments   Labs (all labs ordered are listed, but only abnormal results are displayed) Labs Reviewed - No data to display  EKG None  Radiology No results found.  Procedures Procedures   Medications Ordered in ED Medications  fluorescein ophthalmic strip 3 strip (has no administration in time range)  tetracaine (PONTOCAINE) 0.5 % ophthalmic solution 2 drop (has no administration in time range)    ED Course  I have reviewed the triage vital signs and the nursing notes.  Pertinent labs & imaging results that were available during my care of the patient were reviewed by me and considered in my medical decision making (see chart for details).    MDM Rules/Calculators/A&P                         Woods lamp without corneal abrasion or other concerning findings.  Tonometer readings were in normal limits.  Patient had resolution of her symptoms after tetracaine was applied with intact vision.  No concern patient having glaucoma or other emergent intraocular process.  Will provide patient with erythromycin ointment.  Return precautions discussed for worsening symptoms.  Discussed importance of follow-up with primary care provider.  Ophthalmology referral given. mom at bedside plan also discussed  with.  They both voiced understanding and are in agreement with plan.    Final Clinical Impression(s) / ED Diagnoses Final diagnoses:  Eye irritation    Rx / DC Orders ED Discharge Orders          Ordered    erythromycin ophthalmic ointment        03/12/21 1158             Marita Kansas, PA-C 03/12/21 1200    Marita Kansas, PA-C 03/12/21 1204    Virgina Norfolk, DO 03/12/21 1217

## 2021-03-12 NOTE — Discharge Instructions (Addendum)
Your exam today was reassuring.  Your eye pressures were within normal limits.  I did not see any scratch on your eye with a dark light exam or any other concerning findings.  I have sent 10 antibiotic ointment to your pharmacy.  If your symptoms worsen please return to the emergency room.  Otherwise I do recommend you follow-up with your primary care provider this week for reevaluation.  I have also included ophthalmology clinic information to our on-call ophthalmologist.  Please call their clinic tomorrow to schedule your appointment.

## 2021-04-05 ENCOUNTER — Encounter: Payer: Self-pay | Admitting: General Practice

## 2021-04-16 NOTE — Progress Notes (Signed)
04/16/2021 Brianna Byrd [id number] [birth year]   Chief Complaint: N/V, pain between the shoulder blades  History of Present Illness: Brianna Byrd is a 26 year old female with a past medical history of obesity, asthma, migraine headaches, anemia secondary to menorrhagia, infantile reflux which required "stretching of my esophagus" and Covid 19 04/14/2019. She presents to our office today for further evaluation regarding nausea/vomiting and pain between her shoulder blades.  She reported having intermittent nausea and vomiting since 05/2019 which is progressively worsened over the past 2 years.  She has nausea 4 days weekly and vomits partially digested food or nonbloody bilious emesis 3 days weekly.  No coffee-ground emesis or bloody emesis.  No upper or lower abdominal pain.  She was previously taken Omeprazole 20 mg daily and Famotidine at bedtime.  She started taking her mother's Pantoprazole 40 mg twice daily about 4 weeks ago with no significant improvement.  She presented to the ED 03/25/2021 due to having worsening nausea/vomiting and pain between the shoulder blades.  She was prescribed Carafate 1 g p.o. 4 times daily.  No labs or abdominal imaging were done.  She underwent RUQ sonogram 05/23/2019 which showed a normal gallbladder.  She underwent an EGD 08/28/2019 by Dr. Rush Landmark which showed a J-shaped deformity of the entire stomach, biopsies were consistent with mild chronic gastritis without evidence of H. pylori.  Esophageal biopsies were suggestive of reflux esophagitis.  She previously took Ibuprofen 800 mg or Celebrex a few days weekly but stopped taking these medications about 6 months ago.  She has a history of IDA secondary to menorrhagia.  She stated her menstrual bleeding has significantly improved, no further heavy menstrual bleeding, she has a lighter flow for about 7 days every 30 days.  She denies marijuana use.  No alcohol or drug use.  She is passing normal formed  brown bowel movement most days.  No rectal bleeding or black stools.  She has lost 17 pounds over the past 18 months.  No fever, sweats or chills.   RUQ sonogram 05/23/2019: 1. No acute findings. Specifically, no gallstones or findings of acute cholecystitis.   EGD 08/28/2019 by Dr. Rush Landmark: - No gross lesions in esophagus. Biopsied. - Z-line regular, 38 cm from the incisors. - J-shaped deformity of the entire stomach. Erythematous mucosa in the gastric fundus and antrum. No other gross lesions in the stomach. Biopsied. - No gross lesions in the duodenal bulb, in the first portion of the duodenum and in the second portion of the duodenum. Biopsied. 1. Surgical [P], duodenal bx - DUODENAL MUCOSA WITH NO SPECIFIC HISTOPATHOLOGIC CHANGES - NEGATIVE FOR INCREASED INTRAEPITHELIAL LYMPHOCYTES OR VILLOUS ARCHITECTURAL CHANGES 2. Surgical [P], gastric bx - GASTRIC ANTRAL AND OXYNTIC MUCOSA WITH MILD CHRONIC GASTRITIS - WARTHIN STARRY STAIN IS NEGATIVE FOR HELICOBACTER PYLORI 3. Surgical [P], esophageal bx - ESOPHAGEAL SQUAMOUS MUCOSA WITH MILD VASCULAR CONGESTION, AND FOCAL SQUAMOUS BALLOONING, SUGGESTIVE OF REFLUX ESOPHAGITIS - NEGATIVE FOR INCREASED INTRAEPITHELIAL EOSINOPHILS  CBC Latest Ref Rng & Units 10/03/2020 05/18/2020 12/21/2019  WBC 4.0 - 10.5 K/uL 10.0 6.6 6.4  Hemoglobin 12.0 - 15.0 g/dL 10.5(L) 10.2(L) 9.9(L)  Hematocrit 36.0 - 46.0 % 33.7(L) 33.3(L) 33.8(L)  Platelets 150 - 400 K/uL 272 369 367    CMP Latest Ref Rng & Units 10/03/2020 05/18/2020 12/21/2019  Glucose 70 - 99 mg/dL 78 110(H) 88  BUN 6 - 20 mg/dL 6 5(L) 6  Creatinine 0.44 - 1.00 mg/dL 0.61 0.68 0.62  Sodium 135 - 145 mmol/L  136 138 140  Potassium 3.5 - 5.1 mmol/L 3.4(L) 3.7 3.9  Chloride 98 - 111 mmol/L 105 107 107  CO2 22 - 32 mmol/L 26 24 24   Calcium 8.9 - 10.3 mg/dL 8.6(L) 8.6(L) 8.4(L)  Total Protein 6.5 - 8.1 g/dL - 7.4 7.2  Total Bilirubin 0.3 - 1.2 mg/dL - 0.5 0.3  Alkaline Phos 38 - 126 U/L - 67 58  AST  15 - 41 U/L - 16 15  ALT 0 - 44 U/L - 9 10    Current Outpatient Medications on File Prior to Visit  Medication Sig Dispense Refill   celecoxib (CELEBREX) 100 MG capsule Take 1 capsule (100 mg total) by mouth 2 (two) times daily. 30 capsule 0   Cyanocobalamin (VITAMIN B-12) 5000 MCG SUBL Place 1 tablet (5,000 mcg total) under the tongue daily. 90 tablet 0   cyclobenzaprine (FLEXERIL) 10 MG tablet Take 1 tablet (10 mg total) by mouth 3 (three) times daily as needed for muscle spasms. 30 tablet 0   erythromycin ophthalmic ointment Place a 1/2 inch ribbon of ointment into the lower eyelid. 3.5 g 0   meclizine (ANTIVERT) 25 MG tablet Take 1 tablet (25 mg total) by mouth 3 (three) times daily as needed for dizziness. 30 tablet 0   rizatriptan (MAXALT-MLT) 10 MG disintegrating tablet Take 1 tablet earliest onset of migraine.  May repeat in 2 hours if needed.  Maximum 2 tablets in 24 hours. 10 tablet 5   simethicone (GAS-X) 80 MG chewable tablet Chew 1 tablet (80 mg total) by mouth every 6 (six) hours as needed for flatulence. 40 tablet 0   topiramate (TOPAMAX) 50 MG tablet Take 1/2 tablet at bedtime for one week, then increase to 1 tablet at bedtime 30 tablet 0   [DISCONTINUED] esomeprazole (NEXIUM) 40 MG capsule Take 1 capsule (40 mg total) by mouth daily before breakfast. 30 capsule 2   [DISCONTINUED] famotidine (PEPCID) 20 MG tablet Take 1 tablet (20 mg total) by mouth 2 (two) times daily. 30 tablet 0   [DISCONTINUED] fluticasone (FLONASE) 50 MCG/ACT nasal spray Place 2 sprays into both nostrils daily. (Patient not taking: Reported on 01/27/2019) 16 g 0   [DISCONTINUED] medroxyPROGESTERone (PROVERA) 10 MG tablet Take 1 tablet (10 mg total) by mouth daily for 7 days. 7 tablet 0   [DISCONTINUED] SUMAtriptan (IMITREX) 100 MG tablet Take 1 tablet (100 mg total) by mouth every 2 (two) hours as needed for migraine. May repeat in 2 hours if headache persists or recurs. (Patient not taking: Reported on  03/27/2019) 10 tablet 0   No current facility-administered medications on file prior to visit.   Allergies  Allergen Reactions   Peach [Prunus Persica] Swelling    Anything tropical   Current Medications, Allergies, Past Medical History, Past Surgical History, Family History and Social History were reviewed in [employer] record.  Review of Systems:   Constitutional: See HPI. Respiratory: Negative for shortness of breath.   Cardiovascular: Negative for chest pain, palpitations and leg swelling.  Gastrointestinal: See HPI.  Musculoskeletal: + Upper back pain.  Neurological: Negative for dizziness, headaches or paresthesias.   Physical Exam: There were no vitals taken for this visit.  Wt Readings from Last 3 Encounters:  04/17/21 194 lb 2 oz (88.1 kg)  03/12/21 190 lb (86.2 kg)  02/13/21 194 lb 9.6 oz (88.3 kg)  07/30/2019:      211lbs General: 26 year old female in no acute distress. Head: Normocephalic and atraumatic. Eyes: No scleral  icterus. Conjunctiva pink . Ears: Normal auditory acuity. Mouth: Dentition intact. No ulcers or lesions.  Lungs: Clear throughout to auscultation. Heart: Regular rate and rhythm, no murmur. Abdomen: Soft, nondistended.  Mild epigastric tenderness without rebound or guarding.  No masses or hepatomegaly. Normal bowel sounds x 4 quadrants.  Rectal: Deferred. Musculoskeletal: Symmetrical with no gross deformities. Extremities: No edema. Neurological: Alert oriented x 4. No focal deficits.  Psychological: Alert and cooperative. Normal mood and affect.  Assessment and Recommendations:  18) 26 year old female with intermittent N/V since 05/2019 with associated pain between the shoulder blades which typically occurs after eating.  No specific food triggers.  RUQ sono 05/2019 showed a normal gallbladder.  EGD 08/28/2019 showed a J-shaped deformity of the entire stomach, mild gastritis and mild reflux esophagitis.  No significant  improvement on PPI twice daily and Carafate. -CCK HIDA scan to rule out gallbladder dyskinesia -CBC, CMP and lipase level -Consider repeat CTAP versus gastric emptying study if CCK HIDA negative and if symptoms persist -Continue Pantoprazole 40 mg p.o. twice daily for now -Stop Carafate -Ondansetron 4 mg p.o. every 6 hours as needed, patient has supply -FD guard 2 tabs p.o. twice daily as needed for N/V -Patient contact office if symptoms worsen -Further recommendation to be determined after the above evaluation completed  2) History of anemia secondary menorrhagia.  Patient reported menorrhagia has significantly improved. -CBC as ordered above -If she remains anemic without further menorrhagia she may require further endoscopic evaluation

## 2021-04-17 ENCOUNTER — Other Ambulatory Visit (INDEPENDENT_AMBULATORY_CARE_PROVIDER_SITE_OTHER): Payer: Self-pay

## 2021-04-17 ENCOUNTER — Other Ambulatory Visit: Payer: Self-pay

## 2021-04-17 ENCOUNTER — Ambulatory Visit (INDEPENDENT_AMBULATORY_CARE_PROVIDER_SITE_OTHER): Payer: Self-pay | Admitting: Nurse Practitioner

## 2021-04-17 ENCOUNTER — Encounter: Payer: Self-pay | Admitting: Nurse Practitioner

## 2021-04-17 VITALS — BP 100/70 | HR 76 | Ht 61.25 in | Wt 194.1 lb

## 2021-04-17 DIAGNOSIS — R112 Nausea with vomiting, unspecified: Secondary | ICD-10-CM

## 2021-04-17 DIAGNOSIS — K2970 Gastritis, unspecified, without bleeding: Secondary | ICD-10-CM

## 2021-04-17 DIAGNOSIS — D500 Iron deficiency anemia secondary to blood loss (chronic): Secondary | ICD-10-CM

## 2021-04-17 DIAGNOSIS — K219 Gastro-esophageal reflux disease without esophagitis: Secondary | ICD-10-CM

## 2021-04-17 DIAGNOSIS — N924 Excessive bleeding in the premenopausal period: Secondary | ICD-10-CM

## 2021-04-17 LAB — CBC WITH DIFFERENTIAL/PLATELET
Basophils Absolute: 0 10*3/uL (ref 0.0–0.1)
Basophils Relative: 0.4 % (ref 0.0–3.0)
Eosinophils Absolute: 0.1 10*3/uL (ref 0.0–0.7)
Eosinophils Relative: 1.9 % (ref 0.0–5.0)
HCT: 35 % — ABNORMAL LOW (ref 36.0–46.0)
Hemoglobin: 10.9 g/dL — ABNORMAL LOW (ref 12.0–15.0)
Lymphocytes Relative: 26.1 % (ref 12.0–46.0)
Lymphs Abs: 1.3 10*3/uL (ref 0.7–4.0)
MCHC: 31.2 g/dL (ref 30.0–36.0)
MCV: 82.9 fl (ref 78.0–100.0)
Monocytes Absolute: 0.3 10*3/uL (ref 0.1–1.0)
Monocytes Relative: 6 % (ref 3.0–12.0)
Neutro Abs: 3.4 10*3/uL (ref 1.4–7.7)
Neutrophils Relative %: 65.6 % (ref 43.0–77.0)
Platelets: 291 10*3/uL (ref 150.0–400.0)
RBC: 4.22 Mil/uL (ref 3.87–5.11)
RDW: 15.9 % — ABNORMAL HIGH (ref 11.5–15.5)
WBC: 5.2 10*3/uL (ref 4.0–10.5)

## 2021-04-17 LAB — COMPREHENSIVE METABOLIC PANEL
ALT: 7 U/L (ref 0–35)
AST: 11 U/L (ref 0–37)
Albumin: 4.1 g/dL (ref 3.5–5.2)
Alkaline Phosphatase: 72 U/L (ref 39–117)
BUN: 6 mg/dL (ref 6–23)
CO2: 27 mEq/L (ref 19–32)
Calcium: 8.7 mg/dL (ref 8.4–10.5)
Chloride: 106 mEq/L (ref 96–112)
Creatinine, Ser: 0.65 mg/dL (ref 0.40–1.20)
GFR: 122.57 mL/min (ref >60.00)
Glucose, Bld: 90 mg/dL (ref 70–99)
Potassium: 3.8 mEq/L (ref 3.5–5.1)
Sodium: 139 mEq/L (ref 135–145)
Total Bilirubin: 0.4 mg/dL (ref 0.2–1.2)
Total Protein: 7.5 g/dL (ref 6.0–8.3)

## 2021-04-17 LAB — LIPASE: Lipase: 14 U/L (ref 11.0–59.0)

## 2021-04-17 MED ORDER — ONDANSETRON HCL 4 MG PO TABS: 4.0000 mg | ORAL_TABLET | Freq: Three times a day (TID) | ORAL | 0 refills | Status: DC | PRN

## 2021-04-17 MED ORDER — PANTOPRAZOLE SODIUM 40 MG PO TBEC: 40.0000 mg | DELAYED_RELEASE_TABLET | Freq: Two times a day (BID) | ORAL | 1 refills | Status: DC

## 2021-04-17 NOTE — Patient Instructions (Addendum)
IMAGING: You will be contacted by [place] (Your caller ID will indicate phone # [phone]) in the next 7 days to schedule your HIDA scan to check your gallbladder. If you have not heard from them within 7 business days, please call [facility] at [phone] to follow up on the status of your appointment.    MEDICATION: We have sent the following medication to your pharmacy for you to pick up at your convenience: Pantoprazole 40 MG tablet, take 1 twice a day. Zofran 4 MG tablet, take 1 tablet three times a day before meals as needed for nausea.  OVER THE COUNTER MEDICATION Please purchase the following medications over the counter and take as directed:  FDgard 2 capsules twice a day as needed for nausea.  LABS:  Lab work has been ordered for you today. Our lab is located in the basement. Press "B" on the elevator. The lab is located at the first door on the left as you exit the elevator.  HEALTHCARE LAWS AND MY CHART RESULTS:  Due to recent changes in healthcare laws, you may see the results of your imaging and laboratory studies on MyChart before your provider has had a chance to review them.   We understand that in some cases there may be results that are confusing or concerning to you. Not all laboratory results come back in the same time frame and the provider may be waiting for multiple results in order to interpret others.  Please give [place] 48 hours in order for your provider to thoroughly review all the results before contacting the office for clarification of your results.   It was great seeing you today! Thank you for entrusting me with your care and choosing [facility]  Noralyn Pick, CRNP  The [facility] providers would like to encourage you to use [facility] to communicate with providers for non-urgent requests or questions.  Due to long hold times on the telephone, sending your provider a message by [facility] may be  faster and more efficient way to get a response. Please allow 48 business hours for a response.  Please remember that this is for non-urgent requests/questions.  If you are age 21 or older, your body mass index should be between 23-30. Your Body mass index is 36.38 kg/m. If this is out of the aforementioned range listed, please consider follow up with your Primary Care Provider.  If you are age 63 or younger, your body mass index should be between 19-25. Your Body mass index is 36.38 kg/m. If this is out of the aformentioned range listed, please consider follow up with your Primary Care Provider.   Low-FODMAP Eating Plan FODMAP stands for fermentable oligosaccharides, disaccharides, monosaccharides, and polyols. These are sugars that are hard for some people to digest. A low-FODMAP eating plan may help some people who have irritable bowel syndrome (IBS) and certain other bowel (intestinal) diseases to manage their symptoms. This meal plan can be complicated to follow. Work with a diet and nutrition specialist (dietitian) to make a low-FODMAP eating plan that is right for you. A dietitian can help make sure that you get enough nutrition from this diet. What are tips for following this plan? Reading food labels Check labels for hidden FODMAPs such as: High-fructose syrup. Honey. Agave. Natural fruit flavors. Onion or garlic powder. Choose low-FODMAP foods that contain 3-4 grams of fiber per serving. Check food labels for serving sizes. Eat only one serving at a time  to make sure FODMAP levels stay low. Shopping Shop with a list of foods that are recommended on this diet and make a meal plan. Meal planning Follow a low-FODMAP eating plan for up to 6 weeks, or as told by your health care provider or dietitian. To follow the eating plan: Eliminate high-FODMAP foods from your diet completely. Choose only low-FODMAP foods to eat. You will do this for 2-6 weeks. Gradually reintroduce high-FODMAP  foods into your diet one at a time. Most people should wait a few days before introducing the next new high-FODMAP food into their meal plan. Your dietitian can recommend how quickly you may reintroduce foods. Keep a daily record of what and how much you eat and drink. Make note of any symptoms that you have after eating. Review your daily record with a dietitian regularly to identify which foods you can eat and which foods you should avoid. General tips Drink enough fluid each day to keep your urine pale yellow. Avoid processed foods. These often have added sugar and may be high in FODMAPs. Avoid most dairy products, whole grains, and sweeteners. Work with a dietitian to make sure you get enough fiber in your diet. Avoid high FODMAP foods at meals to manage symptoms. Recommended foods Fruits Bananas, oranges, tangerines, lemons, limes, blueberries, raspberries, strawberries, grapes, cantaloupe, honeydew melon, kiwi, papaya, passion fruit, and pineapple. Limited amounts of dried cranberries, banana chips, and shredded coconut. Vegetables Eggplant, zucchini, cucumber, peppers, green beans, bean sprouts, lettuce, arugula, kale, Swiss chard, spinach, collard greens, bok choy, summer squash, potato, and tomato. Limited amounts of corn, carrot, and sweet potato. Green parts of scallions. Grains Gluten-free grains, such as rice, oats, buckwheat, quinoa, corn, polenta, and millet. Gluten-free pasta, bread, or cereal. Rice noodles. Corn tortillas. Meats and other proteins Unseasoned beef, pork, poultry, or fish. Eggs. Berniece Salines. Tofu (firm) and tempeh. Limited amounts of nuts and seeds, such as almonds, walnuts, [place] nuts, pecans, peanuts, nut butters, pumpkin seeds, chia seeds, and sunflower seeds. Dairy Lactose-free milk, yogurt, and kefir. Lactose-free cottage cheese and ice cream. Non-dairy milks, such as almond, coconut, hemp, and rice milk. Non-dairy yogurt. Limited amounts of goat cheese, brie,  mozzarella, parmesan, swiss, and other hard cheeses. Fats and oils Butter-free spreads. Vegetable oils, such as olive, canola, and sunflower oil. Seasoning and other foods Artificial sweeteners with names that do not end in "ol," such as aspartame, saccharine, and stevia. Maple syrup, white table sugar, raw sugar, brown sugar, and molasses. Mayonnaise, soy sauce, and tamari. Fresh basil, coriander, parsley, rosemary, and thyme. Beverages Water and mineral water. Sugar-sweetened soft drinks. Small amounts of orange juice or cranberry juice. Black and green tea. Most dry wines. Coffee. The items listed above may not be a complete list of foods and beverages you can eat. Contact a dietitian for more information. Foods to avoid Fruits Fresh, dried, and juiced forms of apple, pear, watermelon, peach, plum, cherries, apricots, blackberries, boysenberries, figs, nectarines, and mango. Avocado. Vegetables Chicory root, artichoke, asparagus, cabbage, snow peas, Brussels sprouts, broccoli, sugar snap peas, mushrooms, celery, and cauliflower. Onions, garlic, leeks, and the white part of scallions. Grains Wheat, including kamut, durum, and semolina. Barley and bulgur. Couscous. Wheat-based cereals. Wheat noodles, bread, crackers, and pastries. Meats and other proteins Fried or fatty meat. Sausage. Cashews and pistachios. Soybeans, baked beans, black beans, chickpeas, kidney beans, fava beans, navy beans, lentils, black-eyed peas, and split peas. Dairy Milk, yogurt, ice cream, and soft cheese. Cream and sour cream. Milk-based sauces. Custard. Buttermilk.  Soy milk. Seasoning and other foods Any sugar-free gum or candy. Foods that contain artificial sweeteners such as sorbitol, mannitol, isomalt, or xylitol. Foods that contain honey, high-fructose corn syrup, or agave. Bouillon, vegetable stock, beef stock, and chicken stock. Garlic and onion powder. Condiments made with onion, such as hummus, chutney, pickles,  relish, salad dressing, and salsa. Tomato paste. Beverages Chicory-based drinks. Coffee substitutes. Chamomile tea. Fennel tea. Sweet or fortified wines such as port or sherry. Diet soft drinks made with isomalt, mannitol, maltitol, sorbitol, or xylitol. Apple, pear, and mango juice. Juices with high-fructose corn syrup. The items listed above may not be a complete list of foods and beverages you should avoid. Contact a dietitian for more information. Summary FODMAP stands for fermentable oligosaccharides, disaccharides, monosaccharides, and polyols. These are sugars that are hard for some people to digest. A low-FODMAP eating plan is a short-term diet that helps to ease symptoms of certain bowel diseases. The eating plan usually lasts up to 6 weeks. After that, high-FODMAP foods are reintroduced gradually and one at a time. This can help you find out which foods may be causing symptoms. A low-FODMAP eating plan can be complicated. It is best to work with a dietitian who has experience with this type of plan. This information is not intended to replace advice given to you by your health care provider. Make sure you discuss any questions you have with your health care provider. Document Revised: 07/23/2019 Document Reviewed: 07/23/2019 Elsevier Patient Education  [place].

## 2021-04-17 NOTE — Progress Notes (Signed)
Attending Physician's Attestation   I have reviewed the chart.   I agree with the Advanced Practitioner's note, impression, and recommendations with any updates as below.    Doha Boling Mansouraty, MD [facility] Office # [phone]  

## 2021-04-18 LAB — IRON: Iron: 37 ug/dL — ABNORMAL LOW (ref 42–145)

## 2021-04-18 LAB — IBC + FERRITIN
Ferritin: 7.3 ng/mL — ABNORMAL LOW (ref 10.0–291.0)
Iron: 37 ug/dL — ABNORMAL LOW (ref 42–145)
Saturation Ratios: 9.1 % — ABNORMAL LOW (ref 20.0–50.0)
TIBC: 406 ug/dL (ref 250.0–450.0)
Transferrin: 290 mg/dL (ref 212.0–360.0)

## 2021-04-24 ENCOUNTER — Emergency Department (HOSPITAL_BASED_OUTPATIENT_CLINIC_OR_DEPARTMENT_OTHER)
Admission: EM | Admit: 2021-04-24 | Discharge: 2021-04-24 | Disposition: A | Discharge status: Home / Self Care | Payer: PRIVATE HEALTH INSURANCE | Attending: Emergency Medicine | Admitting: Emergency Medicine

## 2021-04-24 ENCOUNTER — Encounter (HOSPITAL_BASED_OUTPATIENT_CLINIC_OR_DEPARTMENT_OTHER): Payer: Self-pay | Admitting: *Deleted

## 2021-04-24 ENCOUNTER — Emergency Department (HOSPITAL_BASED_OUTPATIENT_CLINIC_OR_DEPARTMENT_OTHER): Payer: PRIVATE HEALTH INSURANCE

## 2021-04-24 ENCOUNTER — Other Ambulatory Visit: Payer: Self-pay

## 2021-04-24 DIAGNOSIS — R0789 Other chest pain: Secondary | ICD-10-CM | POA: Diagnosis not present

## 2021-04-24 DIAGNOSIS — R051 Acute cough: Secondary | ICD-10-CM | POA: Diagnosis not present

## 2021-04-24 DIAGNOSIS — R059 Cough, unspecified: Secondary | ICD-10-CM | POA: Diagnosis present

## 2021-04-24 DIAGNOSIS — Z20822 Contact with and (suspected) exposure to covid-19: Secondary | ICD-10-CM | POA: Diagnosis not present

## 2021-04-24 LAB — CBC WITH DIFFERENTIAL/PLATELET
Abs Immature Granulocytes: 0.01 K/uL (ref 0.00–0.07)
Basophils Absolute: 0 K/uL (ref 0.0–0.1)
Basophils Relative: 0 %
Eosinophils Absolute: 0.2 K/uL (ref 0.0–0.5)
Eosinophils Relative: 3 %
HCT: 36 % (ref 36.0–46.0)
Hemoglobin: 11.3 g/dL — ABNORMAL LOW (ref 12.0–15.0)
Immature Granulocytes: 0 %
Lymphocytes Relative: 43 %
Lymphs Abs: 1.9 K/uL (ref 0.7–4.0)
MCH: 26.2 pg (ref 26.0–34.0)
MCHC: 31.4 g/dL (ref 30.0–36.0)
MCV: 83.3 fL (ref 80.0–100.0)
Monocytes Absolute: 0.7 K/uL (ref 0.1–1.0)
Monocytes Relative: 14 %
Neutro Abs: 1.8 K/uL (ref 1.7–7.7)
Neutrophils Relative %: 40 %
Platelets: 298 K/uL (ref 150–400)
RBC: 4.32 MIL/uL (ref 3.87–5.11)
RDW: 14.9 % (ref 11.5–15.5)
WBC: 4.6 K/uL (ref 4.0–10.5)
nRBC: 0 % (ref 0.0–0.2)

## 2021-04-24 LAB — COMPREHENSIVE METABOLIC PANEL WITH GFR
ALT: 13 U/L (ref 0–44)
AST: 17 U/L (ref 15–41)
Albumin: 4.1 g/dL (ref 3.5–5.0)
Alkaline Phosphatase: 73 U/L (ref 38–126)
Anion gap: 9 (ref 5–15)
BUN: 7 mg/dL (ref 6–20)
CO2: 24 mmol/L (ref 22–32)
Calcium: 8.7 mg/dL — ABNORMAL LOW (ref 8.9–10.3)
Chloride: 104 mmol/L (ref 98–111)
Creatinine, Ser: 0.59 mg/dL (ref 0.44–1.00)
GFR, Estimated: >60 mL/min
Glucose, Bld: 84 mg/dL (ref 70–99)
Potassium: 3.9 mmol/L (ref 3.5–5.1)
Sodium: 137 mmol/L (ref 135–145)
Total Bilirubin: 0.3 mg/dL (ref 0.3–1.2)
Total Protein: 8.2 g/dL — ABNORMAL HIGH (ref 6.5–8.1)

## 2021-04-24 LAB — URINALYSIS, ROUTINE W REFLEX MICROSCOPIC
Bilirubin Urine: NEGATIVE
Glucose, UA: NEGATIVE mg/dL
Hgb urine dipstick: NEGATIVE
Ketones, ur: NEGATIVE mg/dL
Leukocytes,Ua: NEGATIVE
Nitrite: NEGATIVE
Protein, ur: NEGATIVE mg/dL
Specific Gravity, Urine: 1.025 (ref 1.005–1.030)
pH: 6.5 (ref 5.0–8.0)

## 2021-04-24 LAB — RESP PANEL BY RT-PCR (FLU A&B, COVID) ARPGX2
Influenza A by PCR: NEGATIVE
Influenza B by PCR: NEGATIVE
SARS Coronavirus 2 by RT PCR: NEGATIVE

## 2021-04-24 LAB — PREGNANCY, URINE: Preg Test, Ur: NEGATIVE

## 2021-04-24 LAB — D-DIMER, QUANTITATIVE: D-Dimer, Quant: 0.38 ug{FEU}/mL (ref 0.00–0.50)

## 2021-04-24 MED ORDER — DICLOFENAC SODIUM 75 MG PO TBEC: 75.0000 mg | DELAYED_RELEASE_TABLET | Freq: Two times a day (BID) | ORAL | 0 refills | Status: DC

## 2021-04-24 NOTE — ED Triage Notes (Signed)
Left scapula pain with movement or cough. She has had a productive cough with yellow sputum today.

## 2021-04-24 NOTE — ED Provider Notes (Signed)
[facility] EMERGENCY DEPARTMENT Provider Note   CSN: [id number] Arrival date & time: 04/24/21  1258     History  Chief Complaint  Patient presents with   Cough    Brianna Byrd is a 26 y.o. female.  Pt complains of pain on hte left side of his chest   The history is provided by the patient. No language interpreter was used.  Cough Cough characteristics:  Non-productive Severity:  Moderate Onset quality:  Gradual Timing:  Constant Progression:  Worsening Chronicity:  New Smoker: no   Relieved by:  Nothing Worsened by:  Nothing Ineffective treatments:  None tried Associated symptoms: chest pain       Home Medications Prior to Admission medications   Medication Sig Start Date End Date Taking? Authorizing Provider  diclofenac (VOLTAREN) 75 MG EC tablet Take 1 tablet (75 mg total) by mouth 2 (two) times daily. 04/24/21  Yes Cheron Schaumann K, PA-C  celecoxib (CELEBREX) 100 MG capsule Take 1 capsule (100 mg total) by mouth 2 (two) times daily. 12/27/20   Dione Booze, MD  Cyanocobalamin (VITAMIN B-12) 5000 MCG SUBL Place 1 tablet (5,000 mcg total) under the tongue daily. 07/28/20 04/17/21  Delano Metz, MD  cyclobenzaprine (FLEXERIL) 10 MG tablet Take 1 tablet (10 mg total) by mouth 3 (three) times daily as needed for muscle spasms. 12/27/20   Dione Booze, MD  erythromycin ophthalmic ointment Place a 1/2 inch ribbon of ointment into the lower eyelid. 03/12/21   Marita Kansas, PA-C  meclizine (ANTIVERT) 25 MG tablet Take 1 tablet (25 mg total) by mouth 3 (three) times daily as needed for dizziness. 09/07/20   Terrilee Files, MD  ondansetron (ZOFRAN) 4 MG tablet Take 1 tablet (4 mg total) by mouth 3 (three) times daily as needed for nausea or vomiting. Take before meals 04/17/21   Arnaldo Natal, NP  pantoprazole (PROTONIX) 40 MG tablet Take 1 tablet (40 mg total) by mouth 2 (two) times daily before a meal. 04/17/21   Arnaldo Natal, NP   rizatriptan (MAXALT-MLT) 10 MG disintegrating tablet Take 1 tablet earliest onset of migraine.  May repeat in 2 hours if needed.  Maximum 2 tablets in 24 hours. 02/13/21   Drema Dallas, DO  simethicone (GAS-X) 80 MG chewable tablet Chew 1 tablet (80 mg total) by mouth every 6 (six) hours as needed for flatulence. 12/27/20   Dione Booze, MD  topiramate (TOPAMAX) 50 MG tablet Take 1/2 tablet at bedtime for one week, then increase to 1 tablet at bedtime 02/13/21   Drema Dallas, DO  esomeprazole (NEXIUM) 40 MG capsule Take 1 capsule (40 mg total) by mouth daily before breakfast. 08/28/19 05/18/20  Mansouraty, Netty Starring., MD  famotidine (PEPCID) 20 MG tablet Take 1 tablet (20 mg total) by mouth 2 (two) times daily. 10/24/19 05/18/20  Renne Crigler, PA-C  fluticasone (FLONASE) 50 MCG/ACT nasal spray Place 2 sprays into both nostrils daily. Patient not taking: Reported on 01/27/2019 06/05/18 05/01/19  Palumbo, April, MD  medroxyPROGESTERone (PROVERA) 10 MG tablet Take 1 tablet (10 mg total) by mouth daily for 7 days. 03/04/19 05/01/19  Sharlene Dory, DO  SUMAtriptan (IMITREX) 100 MG tablet Take 1 tablet (100 mg total) by mouth every 2 (two) hours as needed for migraine. May repeat in 2 hours if headache persists or recurs. Patient not taking: Reported on 03/27/2019 03/04/19 05/01/19  Sharlene Dory, DO      Allergies    Peach [prunus persica]  Review of Systems   Review of Systems  Respiratory:  Positive for cough.   Cardiovascular:  Positive for chest pain.  All other systems reviewed and are negative.  Physical Exam Updated Vital Signs BP 110/86 (BP Location: Right Arm)    Pulse 81    Temp 98 F (36.7 C) (Oral)    Resp 18    Ht 5\' 1"  (1.549 m)    Wt 88.1 kg    LMP 03/25/2021    SpO2 100%    BMI 36.70 kg/m  Physical Exam Vitals reviewed.  Constitutional:      Appearance: Normal appearance.  Cardiovascular:     Rate and Rhythm: Normal rate and regular rhythm.  Pulmonary:      Effort: Pulmonary effort is normal.     Breath sounds: Normal breath sounds.  Abdominal:     General: Abdomen is flat.  Musculoskeletal:        General: Normal range of motion.     Cervical back: Normal range of motion.  Skin:    General: Skin is warm.  Neurological:     General: No focal deficit present.     Mental Status: She is alert.  Psychiatric:        Mood and Affect: Mood normal.    ED Results / Procedures / Treatments   Labs (all labs ordered are listed, but only abnormal results are displayed) Labs Reviewed  CBC WITH DIFFERENTIAL/PLATELET - Abnormal; Notable for the following components:      Result Value   Hemoglobin 11.3 (*)    All other components within normal limits  COMPREHENSIVE METABOLIC PANEL - Abnormal; Notable for the following components:   Calcium 8.7 (*)    Total Protein 8.2 (*)    All other components within normal limits  RESP PANEL BY RT-PCR (FLU A&B, COVID) ARPGX2  URINALYSIS, ROUTINE W REFLEX MICROSCOPIC  PREGNANCY, URINE  D-DIMER, QUANTITATIVE    EKG None  Radiology DG Chest Portable 1 View  Result Date: 04/24/2021 CLINICAL DATA:  Cough.  Left scapula pain with cough today. EXAM: PORTABLE CHEST 1 VIEW COMPARISON:  Chest radiographs 10/03/2020 FINDINGS: The heart size and mediastinal contours are within normal limits. Both lungs are clear. The visualized skeletal structures are unremarkable. IMPRESSION: No active disease. Electronically Signed   By: 10/05/2020 M.D.   On: 04/24/2021 13:37    Procedures Procedures    Medications Ordered in ED Medications - No data to display  ED Course/ Medical Decision Making/ A&P                           Medical Decision Making Amount and/or Complexity of Data Reviewed Labs: ordered. Radiology: ordered.  Risk Prescription drug management.   MDM:  ddimer negative,  I suspect muscular pain         Final Clinical Impression(s) / ED Diagnoses Final diagnoses:  Acute cough  Chest wall  pain    Rx / DC Orders ED Discharge Orders          Ordered    diclofenac (VOLTAREN) 75 MG EC tablet  2 times daily        04/24/21 1652          An After Visit Summary was printed and given to the patient.     06/22/21, Elson Areas 04/24/21 1931    06/22/21, MD 04/26/21 [phone]

## 2021-04-24 NOTE — Discharge Instructions (Signed)
Return if any problems.

## 2021-04-26 ENCOUNTER — Other Ambulatory Visit: Payer: Self-pay

## 2021-04-26 DIAGNOSIS — D500 Iron deficiency anemia secondary to blood loss (chronic): Secondary | ICD-10-CM

## 2021-05-05 ENCOUNTER — Ambulatory Visit (HOSPITAL_COMMUNITY): Admission: RE | Admit: 2021-05-05 | Payer: PRIVATE HEALTH INSURANCE | Source: Ambulatory Visit

## 2021-05-05 ENCOUNTER — Encounter (HOSPITAL_COMMUNITY): Payer: Self-pay

## 2021-05-18 ENCOUNTER — Ambulatory Visit (INDEPENDENT_AMBULATORY_CARE_PROVIDER_SITE_OTHER): Payer: No Typology Code available for payment source | Admitting: Family Medicine

## 2021-05-18 ENCOUNTER — Other Ambulatory Visit: Payer: Self-pay

## 2021-05-18 ENCOUNTER — Other Ambulatory Visit (HOSPITAL_COMMUNITY)
Admission: RE | Admit: 2021-05-18 | Discharge: 2021-05-18 | Disposition: A | Discharge status: Home / Self Care | Payer: No Typology Code available for payment source | Source: Ambulatory Visit | Attending: Family Medicine | Admitting: Family Medicine

## 2021-05-18 VITALS — BP 115/78 | HR 88 | Ht 61.0 in

## 2021-05-18 DIAGNOSIS — Z113 Encounter for screening for infections with a predominantly sexual mode of transmission: Secondary | ICD-10-CM | POA: Diagnosis present

## 2021-05-18 DIAGNOSIS — N898 Other specified noninflammatory disorders of vagina: Secondary | ICD-10-CM | POA: Insufficient documentation

## 2021-05-18 DIAGNOSIS — N910 Primary amenorrhea: Secondary | ICD-10-CM | POA: Diagnosis not present

## 2021-05-18 MED ORDER — FLUCONAZOLE 150 MG PO TABS: 150.0000 mg | ORAL_TABLET | Freq: Once | ORAL | 3 refills | Status: AC

## 2021-05-18 NOTE — Progress Notes (Signed)
Patient hasnt not had period since Jan 5th. Patient complaining of some abdominal pain. Patient is having some clumpy discharge. ?

## 2021-05-18 NOTE — Progress Notes (Signed)
? ?  Subjective:  ? ? Patient ID: Brianna Byrd, female    DOB: 1996/02/01, 26 y.o.   MRN: [id number] ? ?HPI ?Patient seen for white discharge.  This been going on for a few days with pelvic pain. ? ?Additionally, she has not had a period in the past couple of months.  She does have some cramping but no bleeding.  Her periods up to now have been regular.  She is taken home pregnancy test, which has been negative. ? ? ?Review of Systems ? ?   ?Objective:  ? Physical Exam ?Vitals reviewed. Exam conducted with a chaperone present.  ?Constitutional:   ?   Appearance: Normal appearance.  ?Abdominal:  ?   Hernia: There is no hernia in the left inguinal area or right inguinal area.  ?Genitourinary: ?   Labia:     ?   Right: No rash, tenderness or lesion.     ?   Left: No rash, tenderness or lesion.   ?   Vagina: No signs of injury. No vaginal discharge or tenderness.  ?   Cervix: Normal.  ?Lymphadenopathy:  ?   Lower Body: No right inguinal adenopathy. No left inguinal adenopathy.  ?Neurological:  ?   Mental Status: She is alert.  ? ? ?   ?Assessment & Plan:  ?1. Vaginal discharge ?Diflucan. ?- Cervicovaginal ancillary only( [place]) ? ?2. Screening for STD (sexually transmitted disease) ?- Cervicovaginal ancillary only( [place]) ? ?3. Delayed period ?F/u in 1 month if still no period, then will work up for amenorrhea ? ? ?

## 2021-05-22 LAB — CERVICOVAGINAL ANCILLARY ONLY
Bacterial Vaginitis (gardnerella): NEGATIVE
Candida Glabrata: NEGATIVE
Candida Vaginitis: NEGATIVE
Chlamydia: NEGATIVE
Comment: NEGATIVE
Comment: NEGATIVE
Comment: NEGATIVE
Comment: NEGATIVE
Comment: NEGATIVE
Comment: NORMAL
Neisseria Gonorrhea: NEGATIVE
Trichomonas: NEGATIVE

## 2021-06-11 IMAGING — US US BREAST*L* LIMITED INC AXILLA
1 series · 6 of 6 positions shown · non-contrast
Comparison: None.

CLINICAL DATA: Patient reports focal tenderness in the 12-1 o'clock
position of the left breast. Patient's referring clinician reports a
lump in the 2 o'clock position of the left breast.

EXAM:
ULTRASOUND OF THE LEFT BREAST

[Series 1: us breast*left* limited inc axilla · 0.09mm/px · 6 of 6 slices shown]
[im 1/6]
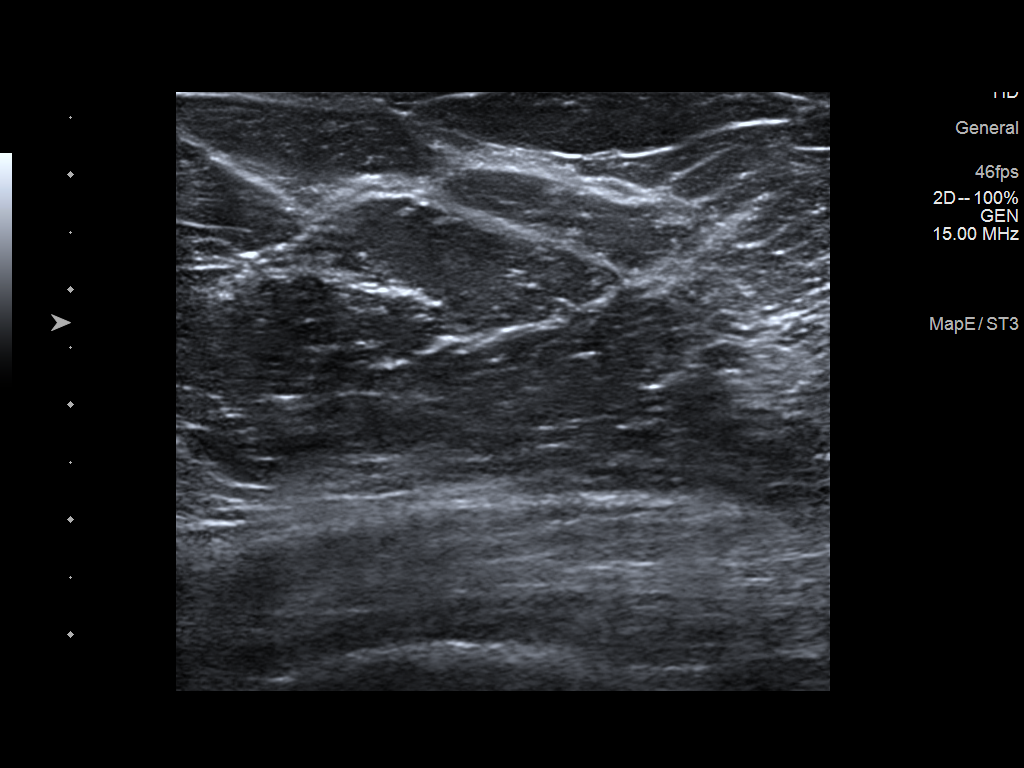
[im 2/6]
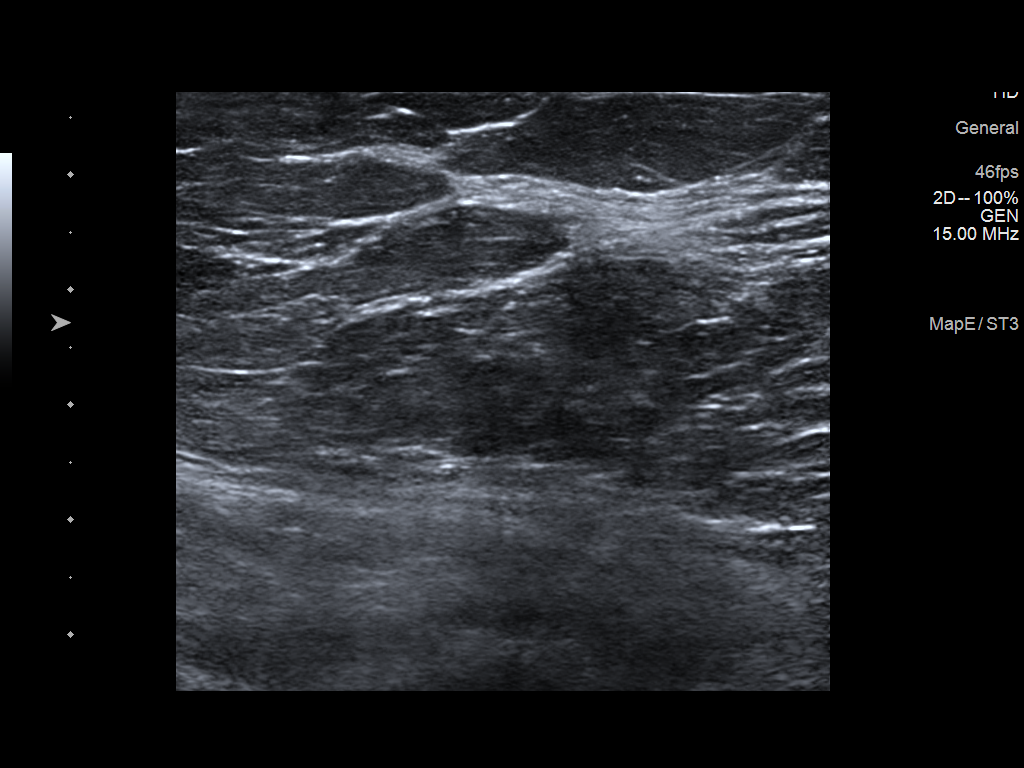
[im 3/6]
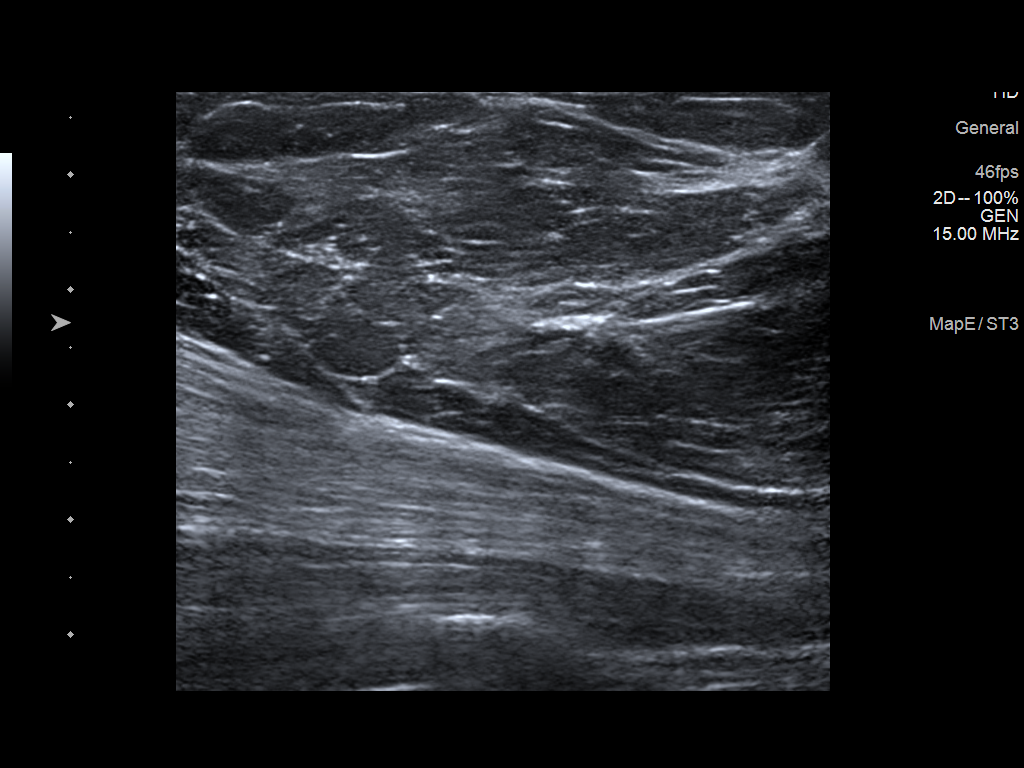
[im 4/6]
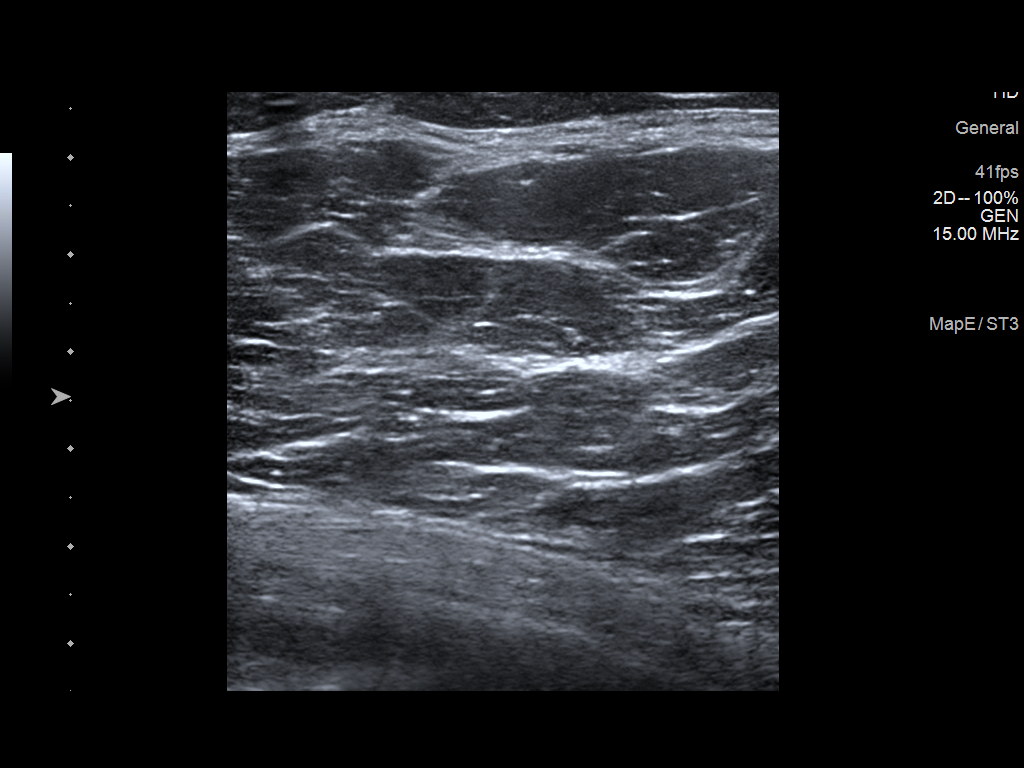
[im 5/6]
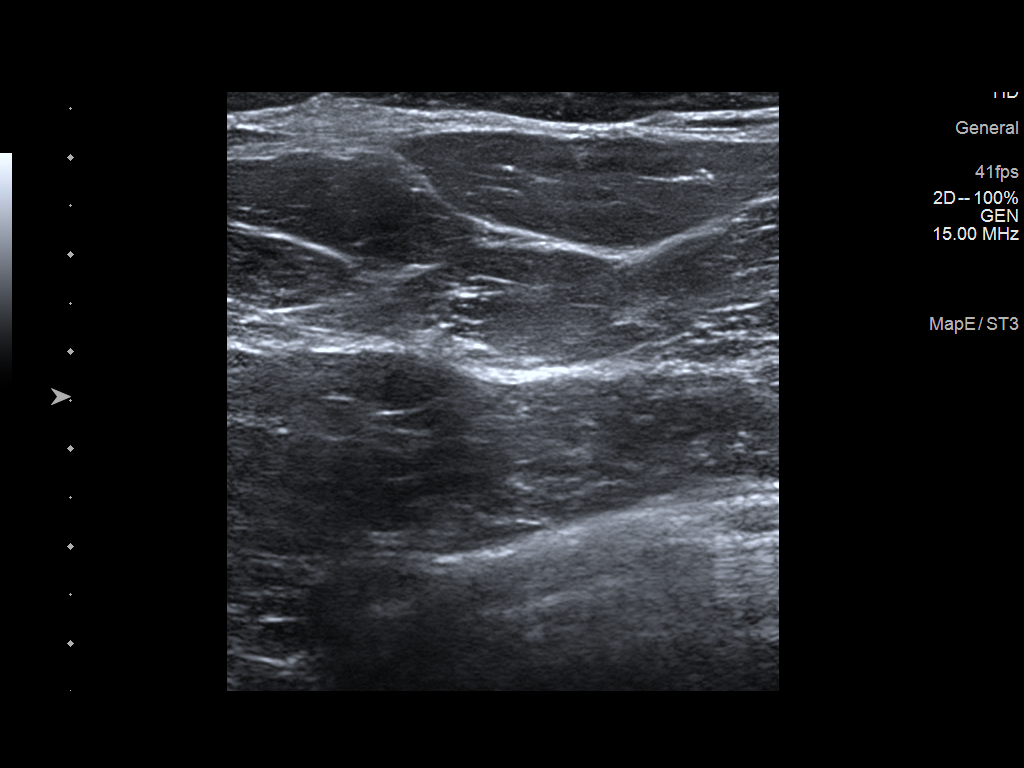
[im 6/6]
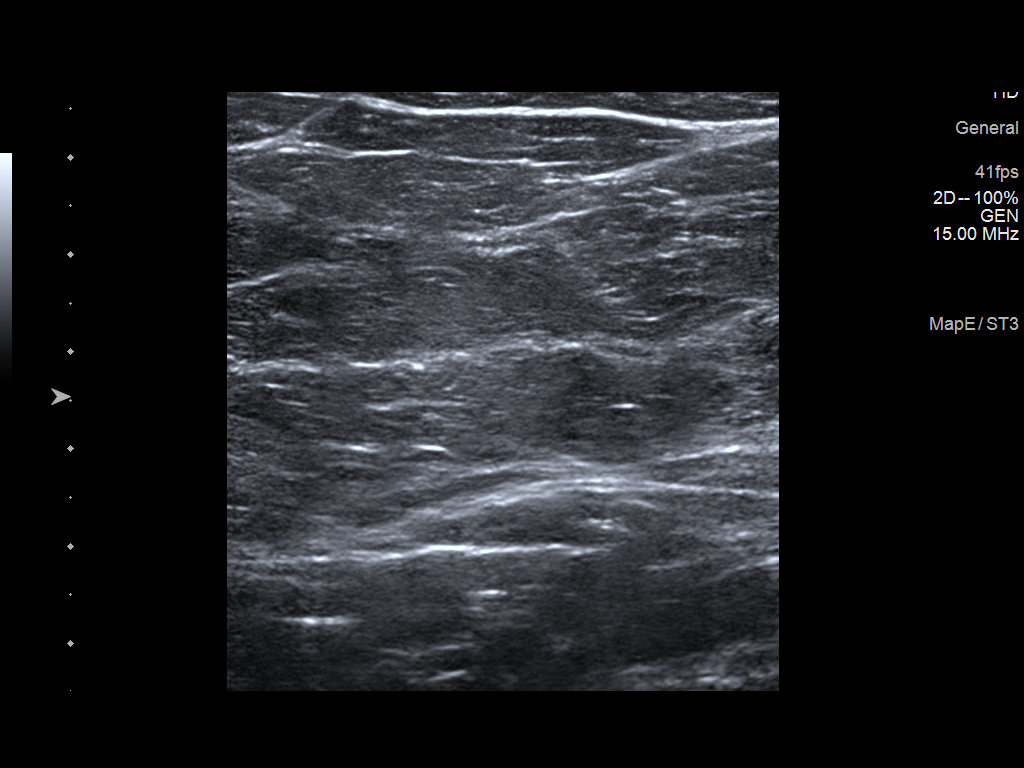

[6 of 6 positions shown; findings below may reference images not displayed]

FINDINGS: On physical exam, patient is focally tender to palpation between 12
and 1 o'clock in the far upper left breast. No mass is palpated in
this location or elsewhere throughout the upper outer quadrant.

Targeted ultrasound is performed, showing heterogeneous
fibroglandular tissue throughout the entire upper and upper outer
quadrant of the left breast. No mass, cyst or suspicious lesion.
IMPRESSION: Negative exam.  No evidence of breast malignancy.

RECOMMENDATION:
Screening mammogram at age 40 unless there are persistent or
intervening clinical concerns. (Code:[id number]-N-8Q3)

I have discussed the findings and recommendations with the patient.
If applicable, a reminder letter will be sent to the patient
regarding the next appointment.

BI-RADS CATEGORY  1: Negative.

## 2021-06-14 ENCOUNTER — Ambulatory Visit: Payer: No Typology Code available for payment source

## 2021-06-14 ENCOUNTER — Ambulatory Visit (INDEPENDENT_AMBULATORY_CARE_PROVIDER_SITE_OTHER): Payer: No Typology Code available for payment source

## 2021-06-14 DIAGNOSIS — N912 Amenorrhea, unspecified: Secondary | ICD-10-CM

## 2021-06-14 NOTE — Progress Notes (Signed)
Pt here for BHCG due to amenorrhea per Candelaria Celeste, DO. Pt given lab order and sent to lab.  ?

## 2021-06-15 ENCOUNTER — Telehealth: Payer: Self-pay

## 2021-06-15 LAB — BETA HCG QUANT (REF LAB): hCG Quant: <1 m[IU]/mL

## 2021-06-15 NOTE — Telephone Encounter (Signed)
Called pt to inform her that her HCG results. Left message for pt to call the office back. ?Uri Turnbough l Landyn Buckalew, CMA  ?

## 2021-07-12 ENCOUNTER — Ambulatory Visit: Payer: No Typology Code available for payment source | Admitting: Family Medicine

## 2021-09-25 NOTE — Progress Notes (Deleted)
NEUROLOGY FOLLOW UP OFFICE NOTE  Brianna Byrd [id number]  Assessment/Plan:   Chronic migraine Migrainous vertigo   Migraine prevention:  topiramate 25mg  at bedtime for one week, then 50mg  at bedtime Migraine rescue:  rizatriptan 10mg  and Zofran.  May use meclizine but sparingly Limit use of pain relievers to no more than 2 days out of week to prevent risk of rebound or medication-overuse headache. Keep headache diary Follow up 6 months.       Subjective:  Brianna Byrd is a 26 year old female who follows up for migraines.  UPDATE: Started topiramate last visit. Intensity:  *** Duration:  *** Frequency:  *** Frequency of abortive medication: *** Current NSAIDS/analgesics:  Tylenol, Midol 5 days a week Current triptans:  rizatriptan 10mg  Current ergotamine:  none Current anti-emetic:  Zofran Current muscle relaxants:  Flexeril Current Antihypertensive medications:  none Current Antidepressant medications:  none Current Anticonvulsant medications:  topiramate 50mg  QHS Current anti-CGRP:  none Current Vitamins/Herbal/Supplements:  none Current Antihistamines/Decongestants:  none Other therapy:  none Hormone/birth control:  none Other medications:  none  Caffeine:  1 Venti cup of coffee daily Diet:  Eats baked foods and vegetables, seafood. No fried foods or red meat.  Tries to drink at least a gallon of water a day. Exercise:  Treadmill and lift light weights. Depression:  no; Anxiety:  no Other pain:  no Sleep hygiene:  Sleep study negative for OSA.  HISTORY:  She began experiencing daily headaches around March 2022.  No preceding event such as head injury or illness.  She has a persistent head pressure.  When she would move her head or change positions or laying down, she would become dizzy, described as an undulating sensation.  Lasts until she falls asleep.  Initially would occur once a week, now almost daily.  Headaches are moderate to severe  bitemporal throbbing with nausea , photophobia and seeing floaters.  No vomiting, phonophobia or autonomic symptoms.  They last up to 3 hours and occur 3 days a week.  No triggers or relieving factors.  She has been treating the headaches with Tylenol, Midol or ibuprofen around 3 days a week.  Treating dizziness with meclizine and Zofran.  She went to vestibular rehab but stopped after it was determined not to be BPPV.  Memory - She had a sleep study in June which revealed snoring but no OSA.  .  MRI of brain and cervical spine with and without contrast on 08/05/2020 were normal.  She had an eye exam in June which was unremarkable.  She also noted memory problems.  Labs in June demonstrated a B12 level of 88.  She was started on supplementation.    Past NSAIDS/analgesics:  ibuprofen, naproxen Past abortive triptans:  sumatriptan 100mg  Past abortive ergotamine:  none Past muscle relaxants:  none Past anti-emetic:  none Past antihypertensive medications:  none Past antidepressant medications:  none Past anticonvulsant medications:  none Past anti-CGRP:  none Past vitamins/Herbal/Supplements:  none Past antihistamines/decongestants:  meclizine Other past therapies:  vestibular rehab    Family history of headache:  No  PAST MEDICAL HISTORY: Past Medical History:  Diagnosis Date   Asthma    Obesity    Reflux     MEDICATIONS: Current Outpatient Medications on File Prior to Visit  Medication Sig Dispense Refill   celecoxib (CELEBREX) 100 MG capsule Take 1 capsule (100 mg total) by mouth 2 (two) times daily. 30 capsule 0   Cyanocobalamin (VITAMIN B-12) 5000 MCG SUBL Place 1  tablet (5,000 mcg total) under the tongue daily. 90 tablet 0   cyclobenzaprine (FLEXERIL) 10 MG tablet Take 1 tablet (10 mg total) by mouth 3 (three) times daily as needed for muscle spasms. (Patient not taking: Reported on 05/18/2021) 30 tablet 0   diclofenac (VOLTAREN) 75 MG EC tablet Take 1 tablet (75 mg total) by mouth 2  (two) times daily. (Patient not taking: Reported on 05/18/2021) 20 tablet 0   erythromycin ophthalmic ointment Place a 1/2 inch ribbon of ointment into the lower eyelid. 3.5 g 0   meclizine (ANTIVERT) 25 MG tablet Take 1 tablet (25 mg total) by mouth 3 (three) times daily as needed for dizziness. (Patient not taking: Reported on 05/18/2021) 30 tablet 0   ondansetron (ZOFRAN) 4 MG tablet Take 1 tablet (4 mg total) by mouth 3 (three) times daily as needed for nausea or vomiting. Take before meals (Patient not taking: Reported on 05/18/2021) 90 tablet 0   pantoprazole (PROTONIX) 40 MG tablet Take 1 tablet (40 mg total) by mouth 2 (two) times daily before a meal. (Patient not taking: Reported on 05/18/2021) 60 tablet 1   rizatriptan (MAXALT-MLT) 10 MG disintegrating tablet Take 1 tablet earliest onset of migraine.  May repeat in 2 hours if needed.  Maximum 2 tablets in 24 hours. (Patient not taking: Reported on 05/18/2021) 10 tablet 5   simethicone (GAS-X) 80 MG chewable tablet Chew 1 tablet (80 mg total) by mouth every 6 (six) hours as needed for flatulence. 40 tablet 0   topiramate (TOPAMAX) 50 MG tablet Take 1/2 tablet at bedtime for one week, then increase to 1 tablet at bedtime 30 tablet 0   [DISCONTINUED] esomeprazole (NEXIUM) 40 MG capsule Take 1 capsule (40 mg total) by mouth daily before breakfast. 30 capsule 2   [DISCONTINUED] famotidine (PEPCID) 20 MG tablet Take 1 tablet (20 mg total) by mouth 2 (two) times daily. 30 tablet 0   [DISCONTINUED] fluticasone (FLONASE) 50 MCG/ACT nasal spray Place 2 sprays into both nostrils daily. (Patient not taking: Reported on 01/27/2019) 16 g 0   [DISCONTINUED] medroxyPROGESTERone (PROVERA) 10 MG tablet Take 1 tablet (10 mg total) by mouth daily for 7 days. 7 tablet 0   [DISCONTINUED] SUMAtriptan (IMITREX) 100 MG tablet Take 1 tablet (100 mg total) by mouth every 2 (two) hours as needed for migraine. May repeat in 2 hours if headache persists or recurs. (Patient not  taking: Reported on 03/27/2019) 10 tablet 0   No current facility-administered medications on file prior to visit.    ALLERGIES: Allergies  Allergen Reactions   Peach [Prunus Persica] Swelling    Anything tropical   Monistat [Miconazole] Swelling    FAMILY HISTORY: Family History  Problem Relation Age of Onset   Hypertension Mother    Diabetes Father    Diabetes Sister    Rectal cancer Cousin    Colon cancer Maternal Aunt    Cancer Neg Hx    Colon polyps Neg Hx    Esophageal cancer Neg Hx    Stomach cancer Neg Hx       Objective:  *** General: No acute distress.  Patient appears well-groomed.   Head:  Normocephalic/atraumatic Eyes:  Fundi examined but not visualized Neck: supple, no paraspinal tenderness, full range of motion Heart:  Regular rate and rhythm Neurological Exam: alert and oriented to person, place, and time.  Speech fluent and not dysarthric, language intact.  CN II-XII intact. Bulk and tone normal, muscle strength 5/5 throughout.  Sensation to  light touch intact.  Deep tendon reflexes 2+ throughout, toes downgoing.  Finger to nose testing intact.  Gait normal, Romberg negative.   Shon Millet, DO

## 2021-09-26 ENCOUNTER — Encounter: Payer: Self-pay | Admitting: Neurology

## 2021-09-26 ENCOUNTER — Ambulatory Visit: Payer: PRIVATE HEALTH INSURANCE | Admitting: Neurology

## 2021-09-26 DIAGNOSIS — Z029 Encounter for administrative examinations, unspecified: Secondary | ICD-10-CM

## 2021-09-28 IMAGING — US US PELVIS COMPLETE WITH TRANSVAGINAL
1 series · 13 of 25 positions shown · non-contrast
Comparison: None

CLINICAL DATA: Heavy vaginal bleeding for several months.

EXAM:
TRANSABDOMINAL AND TRANSVAGINAL ULTRASOUND OF PELVIS
TECHNIQUE: Both transabdominal and transvaginal ultrasound examinations of the
pelvis were performed. Transabdominal technique was performed for
global imaging of the pelvis including uterus, ovaries, adnexal
regions, and pelvic cul-de-sac. It was necessary to proceed with
endovaginal exam following the transabdominal exam to visualize the
endometrium and ovaries.

[Series 1: us pelvis complete with transvaginal · 13 of 60 slices shown]
[im 1/60]
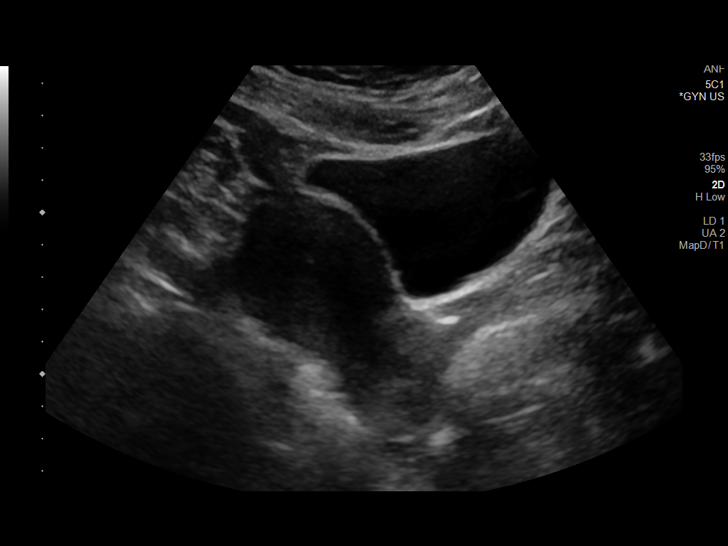
[im 5/60]
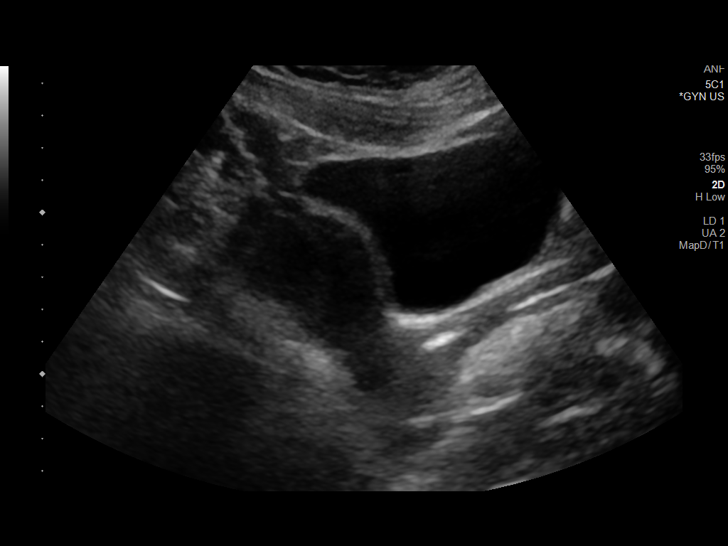
[im 10/60]
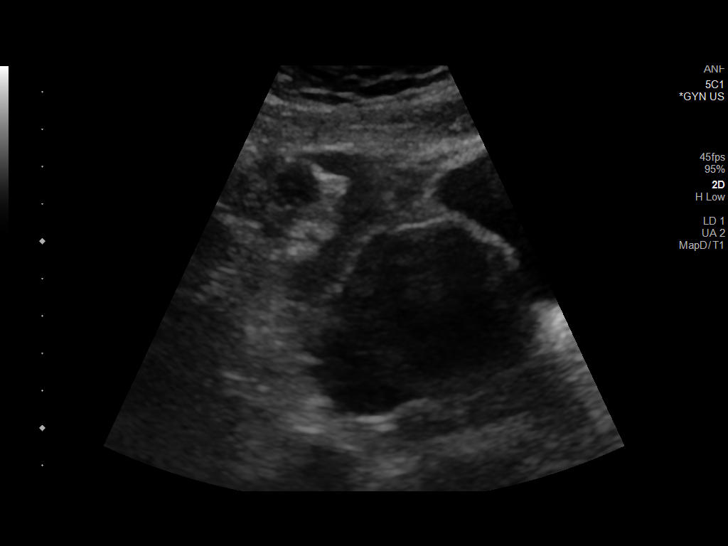
[im 15/60]
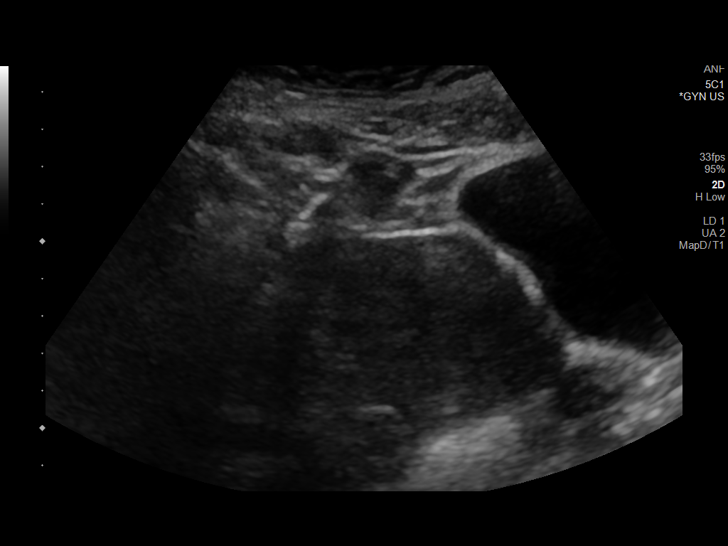
[im 20/60]
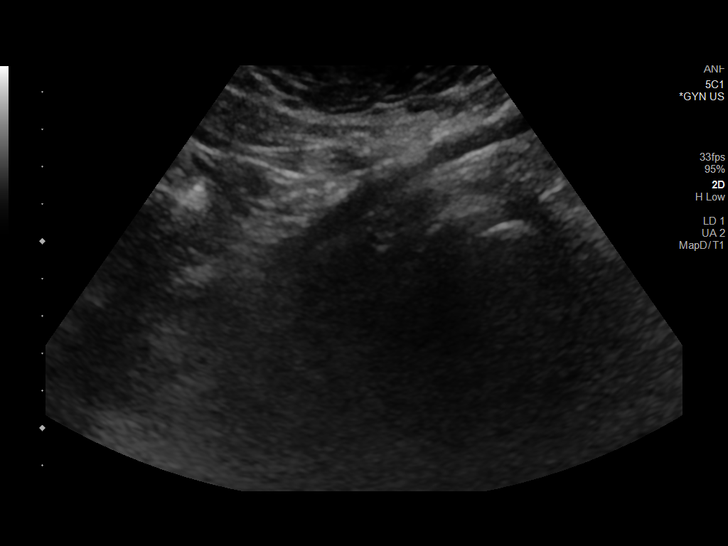
[im 25/60]
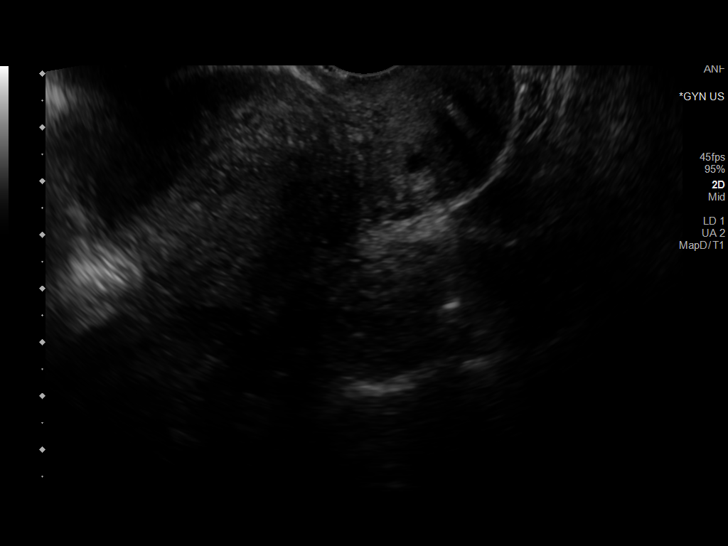
[im 30/60]
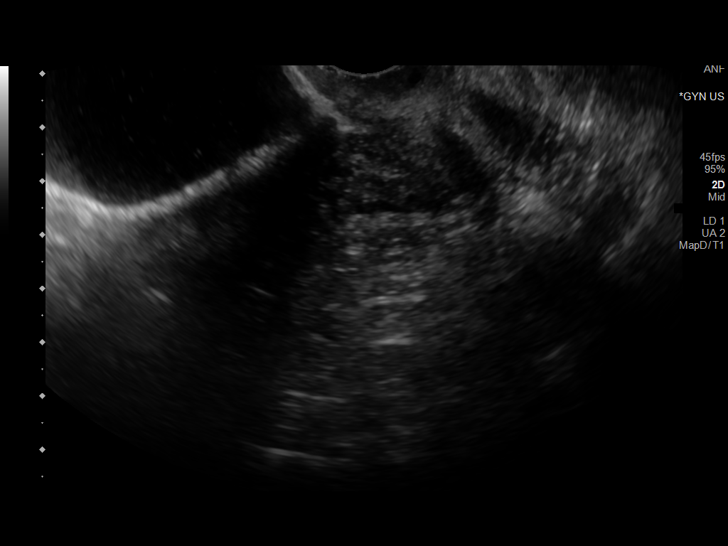
[im 35/60]
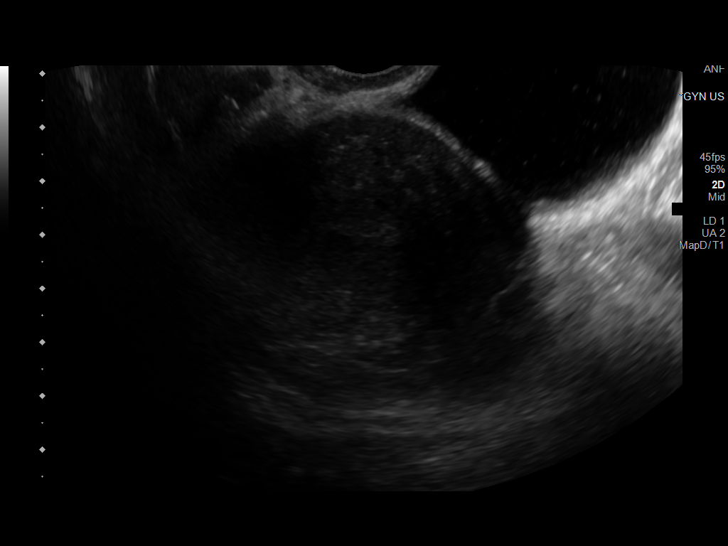
[im 40/60]
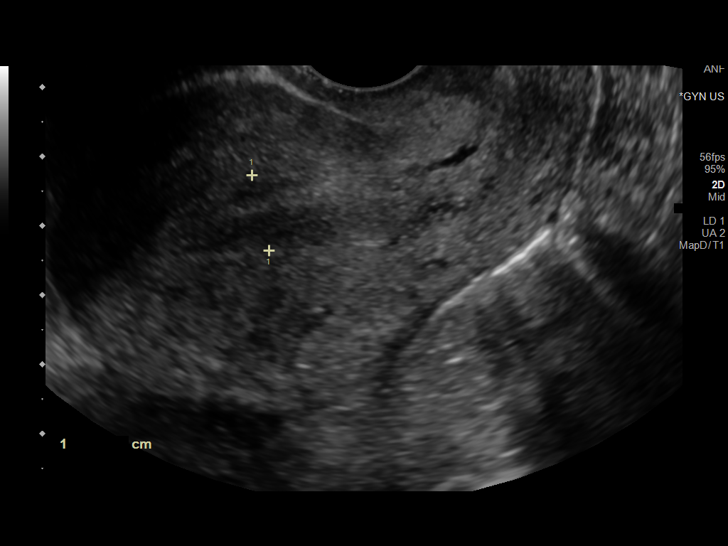
[im 45/60]
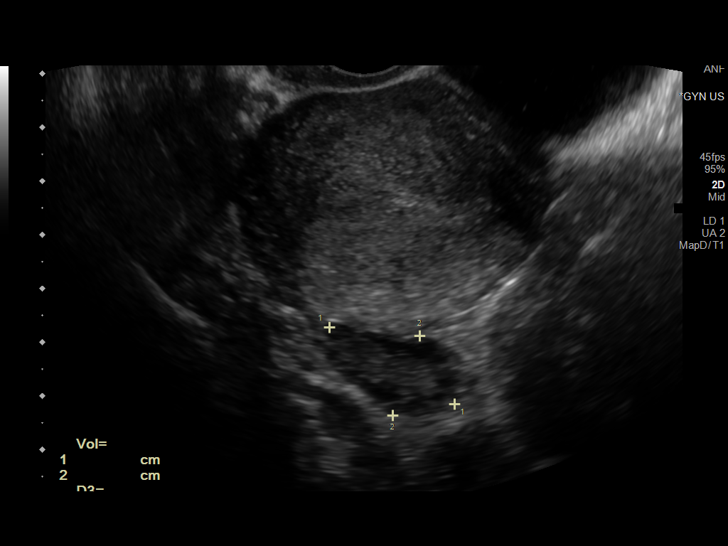
[im 50/60]
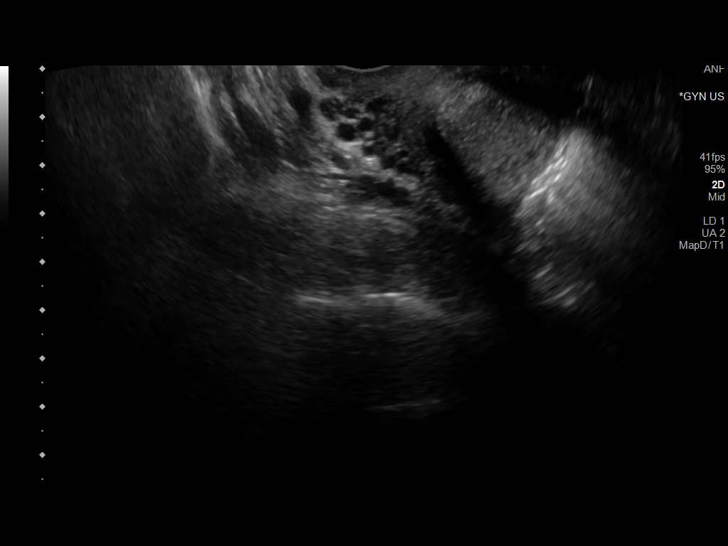
[im 55/60]
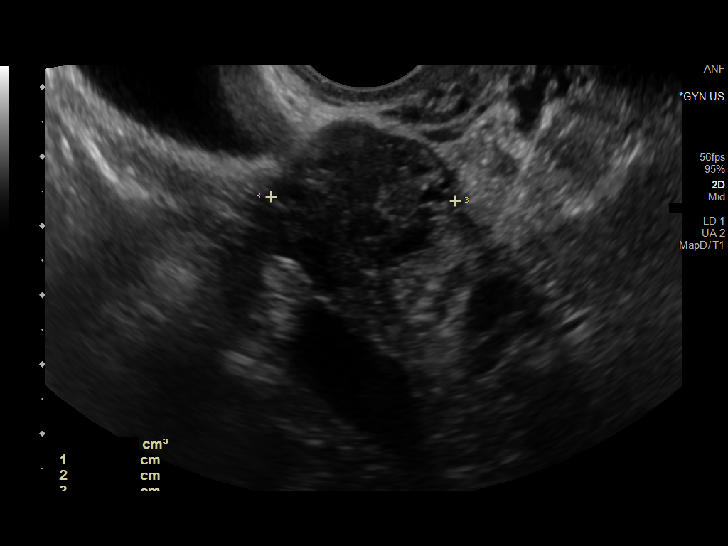
[im 60/60]
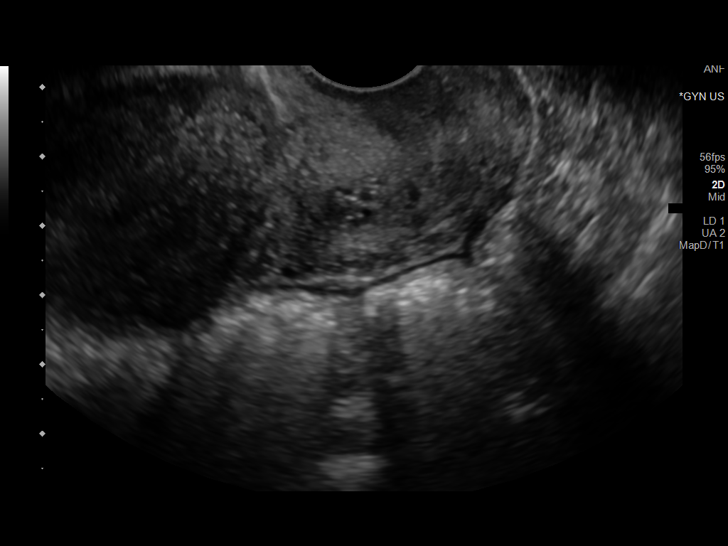

[13 of 25 positions shown; findings below may reference images not displayed]

FINDINGS: Uterus

Measurements: 8.7 x 4.7 x 5.7 cm = volume: 122 mL. No fibroids or
other mass visualized.

Endometrium

Thickness: 12 mm.  No focal abnormality visualized.

Right ovary

Measurements: 2.7 x 1.6 x 2.6 cm = volume: 5.9 mL. Normal
appearance/no adnexal mass.

Left ovary

Measurements: 2.9 x 2.3 x 2.7 cm = volume: 9.2 mL. Normal
appearance/no adnexal mass.

Other findings

No abnormal free fluid.
IMPRESSION: No evidence of uterine fibroids. Endometrial thickness measures 12
mm. If bleeding remains unresponsive to hormonal or medical therapy,
sonohysterogram should be considered for focal lesion work-up. (Ref:
Radiological Reasoning: Algorithmic Workup of Abnormal Vaginal
Bleeding with Endovaginal Sonography and Sonohysterography. AJR
[id number]; 191:S68-73)

Normal appearance of both ovaries.  No adnexal mass identified.

## 2021-11-18 ENCOUNTER — Emergency Department (HOSPITAL_BASED_OUTPATIENT_CLINIC_OR_DEPARTMENT_OTHER)
Admission: EM | Admit: 2021-11-18 | Discharge: 2021-11-19 | Disposition: A | Discharge status: Home / Self Care | Payer: PRIVATE HEALTH INSURANCE | Attending: Emergency Medicine | Admitting: Emergency Medicine

## 2021-11-18 ENCOUNTER — Other Ambulatory Visit: Payer: Self-pay

## 2021-11-18 ENCOUNTER — Encounter (HOSPITAL_BASED_OUTPATIENT_CLINIC_OR_DEPARTMENT_OTHER): Payer: Self-pay | Admitting: Emergency Medicine

## 2021-11-18 DIAGNOSIS — R590 Localized enlarged lymph nodes: Secondary | ICD-10-CM | POA: Insufficient documentation

## 2021-11-18 DIAGNOSIS — R220 Localized swelling, mass and lump, head: Secondary | ICD-10-CM | POA: Insufficient documentation

## 2021-11-18 NOTE — ED Triage Notes (Signed)
Pt c/o swollen lymph nodes on left side x 1 week. Denies difficulty breathing, sore throat. States turning neck increases pain.

## 2021-11-19 MED ORDER — VALACYCLOVIR HCL 1 G PO TABS: 1000.0000 mg | ORAL_TABLET | Freq: Three times a day (TID) | ORAL | 0 refills | Status: DC

## 2021-11-19 MED ORDER — CEPHALEXIN 500 MG PO CAPS: 500.0000 mg | ORAL_CAPSULE | Freq: Four times a day (QID) | ORAL | 0 refills | Status: DC

## 2021-11-19 MED ORDER — CEPHALEXIN 250 MG PO CAPS
500.0000 mg | ORAL_CAPSULE | Freq: Once | ORAL | Status: AC
  Administered 2021-11-19: 500 mg via ORAL
  Filled 2021-11-19: qty 2

## 2021-11-19 NOTE — Discharge Instructions (Addendum)
Begin taking Keflex and Valtrex as prescribed.  Take ibuprofen 600 mg 3 times daily for the next 3 days.  Follow-up with primary doctor if not improving in the next 1 to 2 weeks, and return to the ER if symptoms significantly worsen or change.

## 2021-11-19 NOTE — ED Provider Notes (Signed)
[facility] EMERGENCY DEPARTMENT Provider Note   CSN: [id number] Arrival date & time: 11/18/21  2340     History  No chief complaint on file.   Brianna Byrd is a 26 y.o. female.  Patient is a 26 year old female with past medical history of tension headaches, recently diagnosed HSV of the genitalia.  Patient presenting today for evaluation of a painful lymph node in the left side of her neck.  This has been worsening over the past several days.  She denies any fevers, chills, or sore throat.  She describes a tender lymph node in her right groin last week.  She was seen at her primary doctor's office and wet prep revealed BV.  Patient was treated with antibiotics for this, but describes some burning to her vagina.  She is concerned the lymph nodes may be related to the HSV.  The history is provided by the patient.       Home Medications Prior to Admission medications   Medication Sig Start Date End Date Taking? Authorizing Provider  celecoxib (CELEBREX) 100 MG capsule Take 1 capsule (100 mg total) by mouth 2 (two) times daily. 12/27/20   Dione Booze, MD  Cyanocobalamin (VITAMIN B-12) 5000 MCG SUBL Place 1 tablet (5,000 mcg total) under the tongue daily. 07/28/20 04/17/21  Delano Metz, MD  cyclobenzaprine (FLEXERIL) 10 MG tablet Take 1 tablet (10 mg total) by mouth 3 (three) times daily as needed for muscle spasms. Patient not taking: Reported on 05/18/2021 12/27/20   Dione Booze, MD  diclofenac (VOLTAREN) 75 MG EC tablet Take 1 tablet (75 mg total) by mouth 2 (two) times daily. Patient not taking: Reported on 05/18/2021 04/24/21   Elson Areas, PA-C  erythromycin ophthalmic ointment Place a 1/2 inch ribbon of ointment into the lower eyelid. 03/12/21   Marita Kansas, PA-C  meclizine (ANTIVERT) 25 MG tablet Take 1 tablet (25 mg total) by mouth 3 (three) times daily as needed for dizziness. Patient not taking: Reported on 05/18/2021 09/07/20   Terrilee Files, MD   ondansetron (ZOFRAN) 4 MG tablet Take 1 tablet (4 mg total) by mouth 3 (three) times daily as needed for nausea or vomiting. Take before meals Patient not taking: Reported on 05/18/2021 04/17/21   Arnaldo Natal, NP  pantoprazole (PROTONIX) 40 MG tablet Take 1 tablet (40 mg total) by mouth 2 (two) times daily before a meal. Patient not taking: Reported on 05/18/2021 04/17/21   Arnaldo Natal, NP  rizatriptan (MAXALT-MLT) 10 MG disintegrating tablet Take 1 tablet earliest onset of migraine.  May repeat in 2 hours if needed.  Maximum 2 tablets in 24 hours. Patient not taking: Reported on 05/18/2021 02/13/21   Drema Dallas, DO  simethicone (GAS-X) 80 MG chewable tablet Chew 1 tablet (80 mg total) by mouth every 6 (six) hours as needed for flatulence. 12/27/20   Dione Booze, MD  topiramate (TOPAMAX) 50 MG tablet Take 1/2 tablet at bedtime for one week, then increase to 1 tablet at bedtime 02/13/21   Drema Dallas, DO  esomeprazole (NEXIUM) 40 MG capsule Take 1 capsule (40 mg total) by mouth daily before breakfast. 08/28/19 05/18/20  Mansouraty, Netty Starring., MD  famotidine (PEPCID) 20 MG tablet Take 1 tablet (20 mg total) by mouth 2 (two) times daily. 10/24/19 05/18/20  Renne Crigler, PA-C  fluticasone (FLONASE) 50 MCG/ACT nasal spray Place 2 sprays into both nostrils daily. Patient not taking: Reported on 01/27/2019 06/05/18 05/01/19  Nicanor Alcon, April, MD  medroxyPROGESTERone (  PROVERA) 10 MG tablet Take 1 tablet (10 mg total) by mouth daily for 7 days. 03/04/19 05/01/19  Shelda Pal, DO  SUMAtriptan (IMITREX) 100 MG tablet Take 1 tablet (100 mg total) by mouth every 2 (two) hours as needed for migraine. May repeat in 2 hours if headache persists or recurs. Patient not taking: Reported on 03/27/2019 03/04/19 05/01/19  Shelda Pal, DO      Allergies    Peach [prunus persica] and Monistat [miconazole]    Review of Systems   Review of Systems  All other systems reviewed and  are negative.   Physical Exam Updated Vital Signs BP 114/82   Pulse 73   Temp 97.7 F (36.5 C) (Oral)   Resp 17   Ht 5\' 1"  (1.549 m)   Wt 88.9 kg   LMP 09/10/2021 (Approximate)   SpO2 99%   BMI 37.03 kg/m  Physical Exam Vitals and nursing note reviewed.  Constitutional:      General: She is not in acute distress.    Appearance: She is well-developed. She is not diaphoretic.  HENT:     Head: Normocephalic and atraumatic.  Neck:     Comments: There are tender, palpable lymph nodes to the left anterior lateral neck.  There is no stridor, hoarse voice, or drooling. Cardiovascular:     Rate and Rhythm: Normal rate and regular rhythm.     Heart sounds: No murmur heard.    No friction rub. No gallop.  Pulmonary:     Effort: Pulmonary effort is normal. No respiratory distress.     Breath sounds: Normal breath sounds. No wheezing.  Abdominal:     General: Bowel sounds are normal. There is no distension.     Palpations: Abdomen is soft.     Tenderness: There is no abdominal tenderness.  Musculoskeletal:        General: Normal range of motion.     Cervical back: Normal range of motion and neck supple.  Lymphadenopathy:     Cervical: Cervical adenopathy present.  Skin:    General: Skin is warm and dry.  Neurological:     General: No focal deficit present.     Mental Status: She is alert and oriented to person, place, and time.     ED Results / Procedures / Treatments   Labs (all labs ordered are listed, but only abnormal results are displayed) Labs Reviewed - No data to display  EKG None  Radiology No results found.  Procedures Procedures    Medications Ordered in ED Medications - No data to display  ED Course/ Medical Decision Making/ A&P  Patient presenting with swollen lymph nodes of the left neck in the setting of recent diagnosis of genital HSV.  She is concerned she may be having a flareup of this due to the swollen node in her right groin and some  burning in her vagina.  She was checked 1 week ago by her primary doctor and diagnosed with BV, but other tests were negative.  At this point, I am uncertain as to the etiology of the adenopathy, but will treat the patient with Valtrex.  I will also prescribe Keflex and have her take ibuprofen.  She is to follow-up with primary doctor if not improving in the next 1 to 2 weeks.  Final Clinical Impression(s) / ED Diagnoses Final diagnoses:  None    Rx / DC Orders ED Discharge Orders     None  Geoffery Lyons, MD 11/19/21 0030

## 2022-01-25 ENCOUNTER — Other Ambulatory Visit: Payer: Self-pay

## 2022-01-25 ENCOUNTER — Emergency Department (HOSPITAL_BASED_OUTPATIENT_CLINIC_OR_DEPARTMENT_OTHER)
Admission: EM | Admit: 2022-01-25 | Discharge: 2022-01-25 | Disposition: A | Discharge status: Home / Self Care | Payer: PRIVATE HEALTH INSURANCE | Attending: Emergency Medicine | Admitting: Emergency Medicine

## 2022-01-25 ENCOUNTER — Encounter (HOSPITAL_BASED_OUTPATIENT_CLINIC_OR_DEPARTMENT_OTHER): Payer: Self-pay | Admitting: Emergency Medicine

## 2022-01-25 ENCOUNTER — Emergency Department (HOSPITAL_BASED_OUTPATIENT_CLINIC_OR_DEPARTMENT_OTHER): Payer: PRIVATE HEALTH INSURANCE

## 2022-01-25 DIAGNOSIS — N760 Acute vaginitis: Secondary | ICD-10-CM | POA: Diagnosis not present

## 2022-01-25 DIAGNOSIS — K648 Other hemorrhoids: Secondary | ICD-10-CM | POA: Insufficient documentation

## 2022-01-25 DIAGNOSIS — B9689 Other specified bacterial agents as the cause of diseases classified elsewhere: Secondary | ICD-10-CM | POA: Insufficient documentation

## 2022-01-25 DIAGNOSIS — J45909 Unspecified asthma, uncomplicated: Secondary | ICD-10-CM | POA: Diagnosis not present

## 2022-01-25 DIAGNOSIS — R109 Unspecified abdominal pain: Secondary | ICD-10-CM | POA: Diagnosis present

## 2022-01-25 LAB — URINALYSIS, ROUTINE W REFLEX MICROSCOPIC
Bilirubin Urine: NEGATIVE
Glucose, UA: NEGATIVE mg/dL
Hgb urine dipstick: NEGATIVE
Ketones, ur: NEGATIVE mg/dL
Leukocytes,Ua: NEGATIVE
Nitrite: NEGATIVE
Protein, ur: NEGATIVE mg/dL
Specific Gravity, Urine: >=1.03 (ref 1.005–1.030)
pH: 6 (ref 5.0–8.0)

## 2022-01-25 LAB — WET PREP, GENITAL
Sperm: NONE SEEN
Trich, Wet Prep: NONE SEEN
WBC, Wet Prep HPF POC: >=10 — AB
Yeast Wet Prep HPF POC: NONE SEEN

## 2022-01-25 LAB — OCCULT BLOOD X 1 CARD TO LAB, STOOL: Fecal Occult Bld: NEGATIVE

## 2022-01-25 LAB — PREGNANCY, URINE: Preg Test, Ur: NEGATIVE

## 2022-01-25 MED ORDER — NAPROXEN 500 MG PO TABS: 500.0000 mg | ORAL_TABLET | Freq: Two times a day (BID) | ORAL | 0 refills | Status: DC

## 2022-01-25 MED ORDER — ACETAMINOPHEN 500 MG PO TABS
1000.0000 mg | ORAL_TABLET | Freq: Once | ORAL | Status: AC
  Administered 2022-01-25: 1000 mg via ORAL
  Filled 2022-01-25: qty 2

## 2022-01-25 MED ORDER — METRONIDAZOLE 500 MG PO TABS: 500.0000 mg | ORAL_TABLET | Freq: Two times a day (BID) | ORAL | 0 refills | Status: DC

## 2022-01-25 NOTE — ED Provider Notes (Signed)
[facility] EMERGENCY DEPARTMENT Provider Note   CSN: [id number] Arrival date & time: 01/25/22  0805     History  Chief Complaint  Patient presents with   Dysuria    Brianna Byrd is a 26 y.o. female with a past medical history of asthma, GERD presenting to the emergency department for evaluation of abdominal pain and rectal pain after urination.  Patient states she started to have itching and burning sensation after urination 2 days ago.  She reports rectal pain after urination.  She also reports suprapubic abdominal pain.  States she was found to have internal hemorrhoids 3 months ago after colonoscopy.  The pain is suprapubic, intermittent, worsened with urination.  States she saw blood when wiping after bowel movement.  Patient is sexually active.  Patient reports rash on the back of her neck 2 days ago.  The rash is itching.   Dysuria      Home Medications Prior to Admission medications   Medication Sig Start Date End Date Taking? Authorizing Provider  celecoxib (CELEBREX) 100 MG capsule Take 1 capsule (100 mg total) by mouth 2 (two) times daily. 12/27/20   Dione Booze, MD  cephALEXin (KEFLEX) 500 MG capsule Take 1 capsule (500 mg total) by mouth 4 (four) times daily. 11/19/21   Geoffery Lyons, MD  Cyanocobalamin (VITAMIN B-12) 5000 MCG SUBL Place 1 tablet (5,000 mcg total) under the tongue daily. 07/28/20 04/17/21  Delano Metz, MD  cyclobenzaprine (FLEXERIL) 10 MG tablet Take 1 tablet (10 mg total) by mouth 3 (three) times daily as needed for muscle spasms. Patient not taking: Reported on 05/18/2021 12/27/20   Dione Booze, MD  diclofenac (VOLTAREN) 75 MG EC tablet Take 1 tablet (75 mg total) by mouth 2 (two) times daily. Patient not taking: Reported on 05/18/2021 04/24/21   Elson Areas, PA-C  erythromycin ophthalmic ointment Place a 1/2 inch ribbon of ointment into the lower eyelid. 03/12/21   Marita Kansas, PA-C  meclizine (ANTIVERT) 25 MG tablet Take 1 tablet  (25 mg total) by mouth 3 (three) times daily as needed for dizziness. Patient not taking: Reported on 05/18/2021 09/07/20   Terrilee Files, MD  ondansetron (ZOFRAN) 4 MG tablet Take 1 tablet (4 mg total) by mouth 3 (three) times daily as needed for nausea or vomiting. Take before meals Patient not taking: Reported on 05/18/2021 04/17/21   Arnaldo Natal, NP  pantoprazole (PROTONIX) 40 MG tablet Take 1 tablet (40 mg total) by mouth 2 (two) times daily before a meal. Patient not taking: Reported on 05/18/2021 04/17/21   Arnaldo Natal, NP  rizatriptan (MAXALT-MLT) 10 MG disintegrating tablet Take 1 tablet earliest onset of migraine.  May repeat in 2 hours if needed.  Maximum 2 tablets in 24 hours. Patient not taking: Reported on 05/18/2021 02/13/21   Drema Dallas, DO  simethicone (GAS-X) 80 MG chewable tablet Chew 1 tablet (80 mg total) by mouth every 6 (six) hours as needed for flatulence. 12/27/20   Dione Booze, MD  topiramate (TOPAMAX) 50 MG tablet Take 1/2 tablet at bedtime for one week, then increase to 1 tablet at bedtime 02/13/21   Drema Dallas, DO  valACYclovir (VALTREX) 1000 MG tablet Take 1 tablet (1,000 mg total) by mouth 3 (three) times daily. 11/19/21   Geoffery Lyons, MD  esomeprazole (NEXIUM) 40 MG capsule Take 1 capsule (40 mg total) by mouth daily before breakfast. 08/28/19 05/18/20  Mansouraty, Netty Starring., MD  famotidine (PEPCID) 20 MG tablet Take  1 tablet (20 mg total) by mouth 2 (two) times daily. 10/24/19 05/18/20  Renne Crigler, PA-C  fluticasone (FLONASE) 50 MCG/ACT nasal spray Place 2 sprays into both nostrils daily. Patient not taking: Reported on 01/27/2019 06/05/18 05/01/19  Palumbo, April, MD  medroxyPROGESTERone (PROVERA) 10 MG tablet Take 1 tablet (10 mg total) by mouth daily for 7 days. 03/04/19 05/01/19  Sharlene Dory, DO  SUMAtriptan (IMITREX) 100 MG tablet Take 1 tablet (100 mg total) by mouth every 2 (two) hours as needed for migraine. May repeat in 2  hours if headache persists or recurs. Patient not taking: Reported on 03/27/2019 03/04/19 05/01/19  Sharlene Dory, DO      Allergies    Peach [prunus persica] and Miconazole    Review of Systems   Review of Systems  Genitourinary:  Positive for dysuria.    Physical Exam Updated Vital Signs BP 111/84 (BP Location: Left Arm)   Pulse 81   Temp 98.6 F (37 C) (Oral)   Resp 18   Ht 5\' 1"  (1.549 m)   Wt 90.7 kg   LMP 01/07/2022   SpO2 98%   BMI 37.79 kg/m  Physical Exam Vitals and nursing note reviewed.  Constitutional:      Appearance: Normal appearance.  HENT:     Head: Normocephalic and atraumatic.     Mouth/Throat:     Mouth: Mucous membranes are moist.  Eyes:     General: No scleral icterus. Cardiovascular:     Rate and Rhythm: Normal rate and regular rhythm.     Pulses: Normal pulses.     Heart sounds: Normal heart sounds.  Pulmonary:     Effort: Pulmonary effort is normal.     Breath sounds: Normal breath sounds.  Abdominal:     General: Abdomen is flat.     Palpations: Abdomen is soft.     Tenderness: There is no abdominal tenderness.  Musculoskeletal:        General: No deformity.  Skin:    General: Skin is warm.     Findings: No rash.  Neurological:     General: No focal deficit present.     Mental Status: She is alert.  Psychiatric:        Mood and Affect: Mood normal.     ED Results / Procedures / Treatments   Labs (all labs ordered are listed, but only abnormal results are displayed) Labs Reviewed  URINALYSIS, ROUTINE W REFLEX MICROSCOPIC  PREGNANCY, URINE    EKG None  Radiology No results found.  Procedures Procedures    Medications Ordered in ED Medications - No data to display  ED Course/ Medical Decision Making/ A&P                           Medical Decision Making Amount and/or Complexity of Data Reviewed Labs: ordered. Radiology: ordered.  Risk OTC drugs. Prescription drug management.   This patient  presents to the ED for dysuria, rectal pain, this involves an extensive number of treatment options, and is a complaint that carries with a high risk of complications and morbidity.  The differential diagnosis includes UTI, STD, PID, hemorrhoid, rectal lac, infectious etiology.  This is not an exhaustive list.  Comorbidities that complicate the patient evaluation See HPI  Social determinants of health NA  Additional history obtained: Additional history obtained from EMR. External records from outside source obtained and review including prior labs  Cardiac monitoring/EKG: The patient  was maintained on a cardiac monitor.  I personally reviewed and interpreted the cardiac monitor which showed an underlying rhythm of: Sinus rhythm.  Lab tests: I ordered and personally interpreted labs.  The pertinent results include UA significant for no acute abnormality.  Negative urine pregnancy test, pending GC/chlamydia and wet prep.  Imaging studies:  Problem list/ ED course/ Critical interventions/ Medical management: HPI: See above Vital signs otherwise within normal range and stable throughout visit. Laboratory/imaging studies significant for: See above. On physical examination, patient is afebrile and appears in no acute distress.  Pelvic exam unremarkable.  No erythema or blood in her vaginal canal.  Negative cervical motion tenderness.  Rectal exam with palpable posterior rectal internal hemorrhoid with no obvious blood. The hemorrhoid is not protruding.  Hemoccult card sent to lab.  Fecal occult blood negative.  Wet prep is positive for clue cells and white blood cell.  UTI unlikely as UA is negative.  UA is positive for clue cells and WBC.  Suspicious for bacterial vaginosis.  I will treat her with metronidazole 500 mg twice daily for 7 days.  Patient's clinical presentations and laboratory/imaging studies are most concerned for internal hemorrhoids and bacterial vaginosis. I sent a Rx of  metronidazole 5 mg bid. Advised patient to follow-up with gastroenterology for evaluation and management of internal hemorrhoid.  Advised patient to return if new or worsening symptoms. Tylenol ordered. Reevaluation of the patient after these medications showed that the patient improved.   I have reviewed the patient home medicines and have made adjustments as needed.  Consultations obtained: I requested consultation with Dr. Manus Gunning, and discussed lab and imaging findings as well as pertinent plan.  He agreed with the plan.  Disposition Continued outpatient therapy. Follow-up with gastroenterology recommended for reevaluation of symptoms. Treatment plan discussed with patient.  Pt acknowledged understanding was agreeable to the plan. Worrisome signs and symptoms were discussed with patient, and patient acknowledged understanding to return to the ED if they noticed these signs and symptoms. Patient was stable upon discharge.   This chart was dictated using voice recognition software.  Despite best efforts to proofread,  errors can occur which can change the documentation meaning.          Final Clinical Impression(s) / ED Diagnoses Final diagnoses:  Internal hemorrhoid  BV (bacterial vaginosis)    Rx / DC Orders ED Discharge Orders          Ordered    metroNIDAZOLE (FLAGYL) 500 MG tablet  2 times daily        01/25/22 1129    naproxen (NAPROSYN) 500 MG tablet  2 times daily        01/25/22 1129              Jeanelle Malling, [place] 01/26/22 0913    Glynn Octave, MD 01/26/22 1031

## 2022-01-25 NOTE — ED Triage Notes (Signed)
Dysuria x 2 days.  Pt states she has abdominal pain and rectal pain after urination.  Lasts about 1 hour.  Returns after urination.  Pt also c/o skin issue at the base of her neckline.  Noted skin discoloration.  No redness or drainage.  Pt states the skin burns.  Denies using any hair products.

## 2022-01-25 NOTE — ED Notes (Signed)
Pelvic setup at bedside.

## 2022-01-25 NOTE — Discharge Instructions (Addendum)
Please take your medication as prescribed. Please take tylenol/ibuprofen/naproxen for pain. I recommend close follow-up with gastroenterology for reevaluation.  Please do not hesitate to return to emergency department if worrisome signs symptoms we discussed become apparent.

## 2022-01-25 NOTE — ED Notes (Signed)
Discharge instructions reviewed with patient. Patient verbalizes understanding, no further questions at this time. Medications/prescriptions and follow up information provided. No acute distress noted at time of departure.  

## 2022-01-29 LAB — GC/CHLAMYDIA PROBE AMP (~~LOC~~) NOT AT ARMC
Chlamydia: NEGATIVE
Comment: NEGATIVE
Comment: NORMAL
Neisseria Gonorrhea: NEGATIVE

## 2022-02-28 IMAGING — CR DG CHEST 2V
2 series · 2 of 2 positions shown · non-contrast
Comparison: 06/18/2019

CLINICAL DATA: Chest tightness, reflux

EXAM:
CHEST - 2 VIEW

[w chest pa]
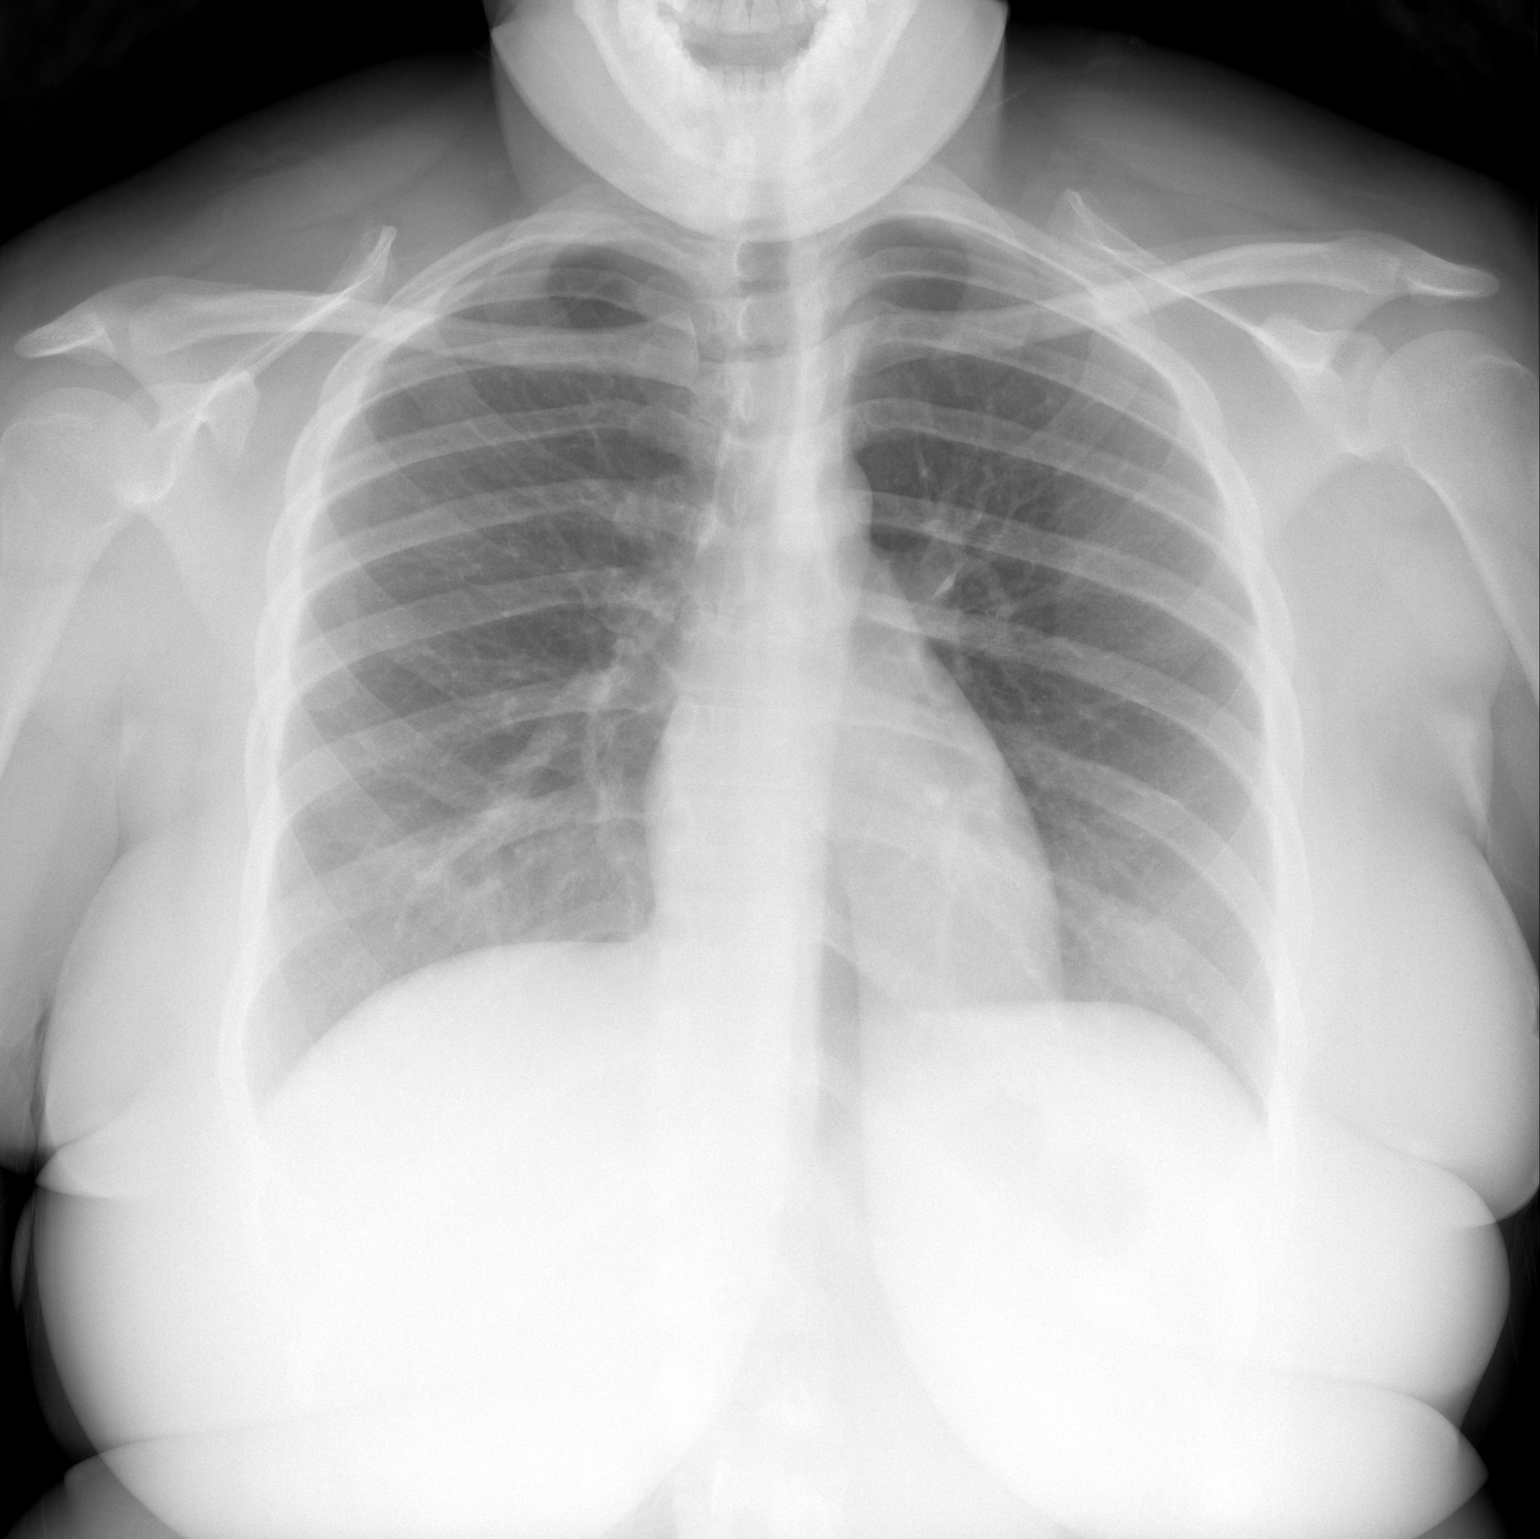

[w chest lat]
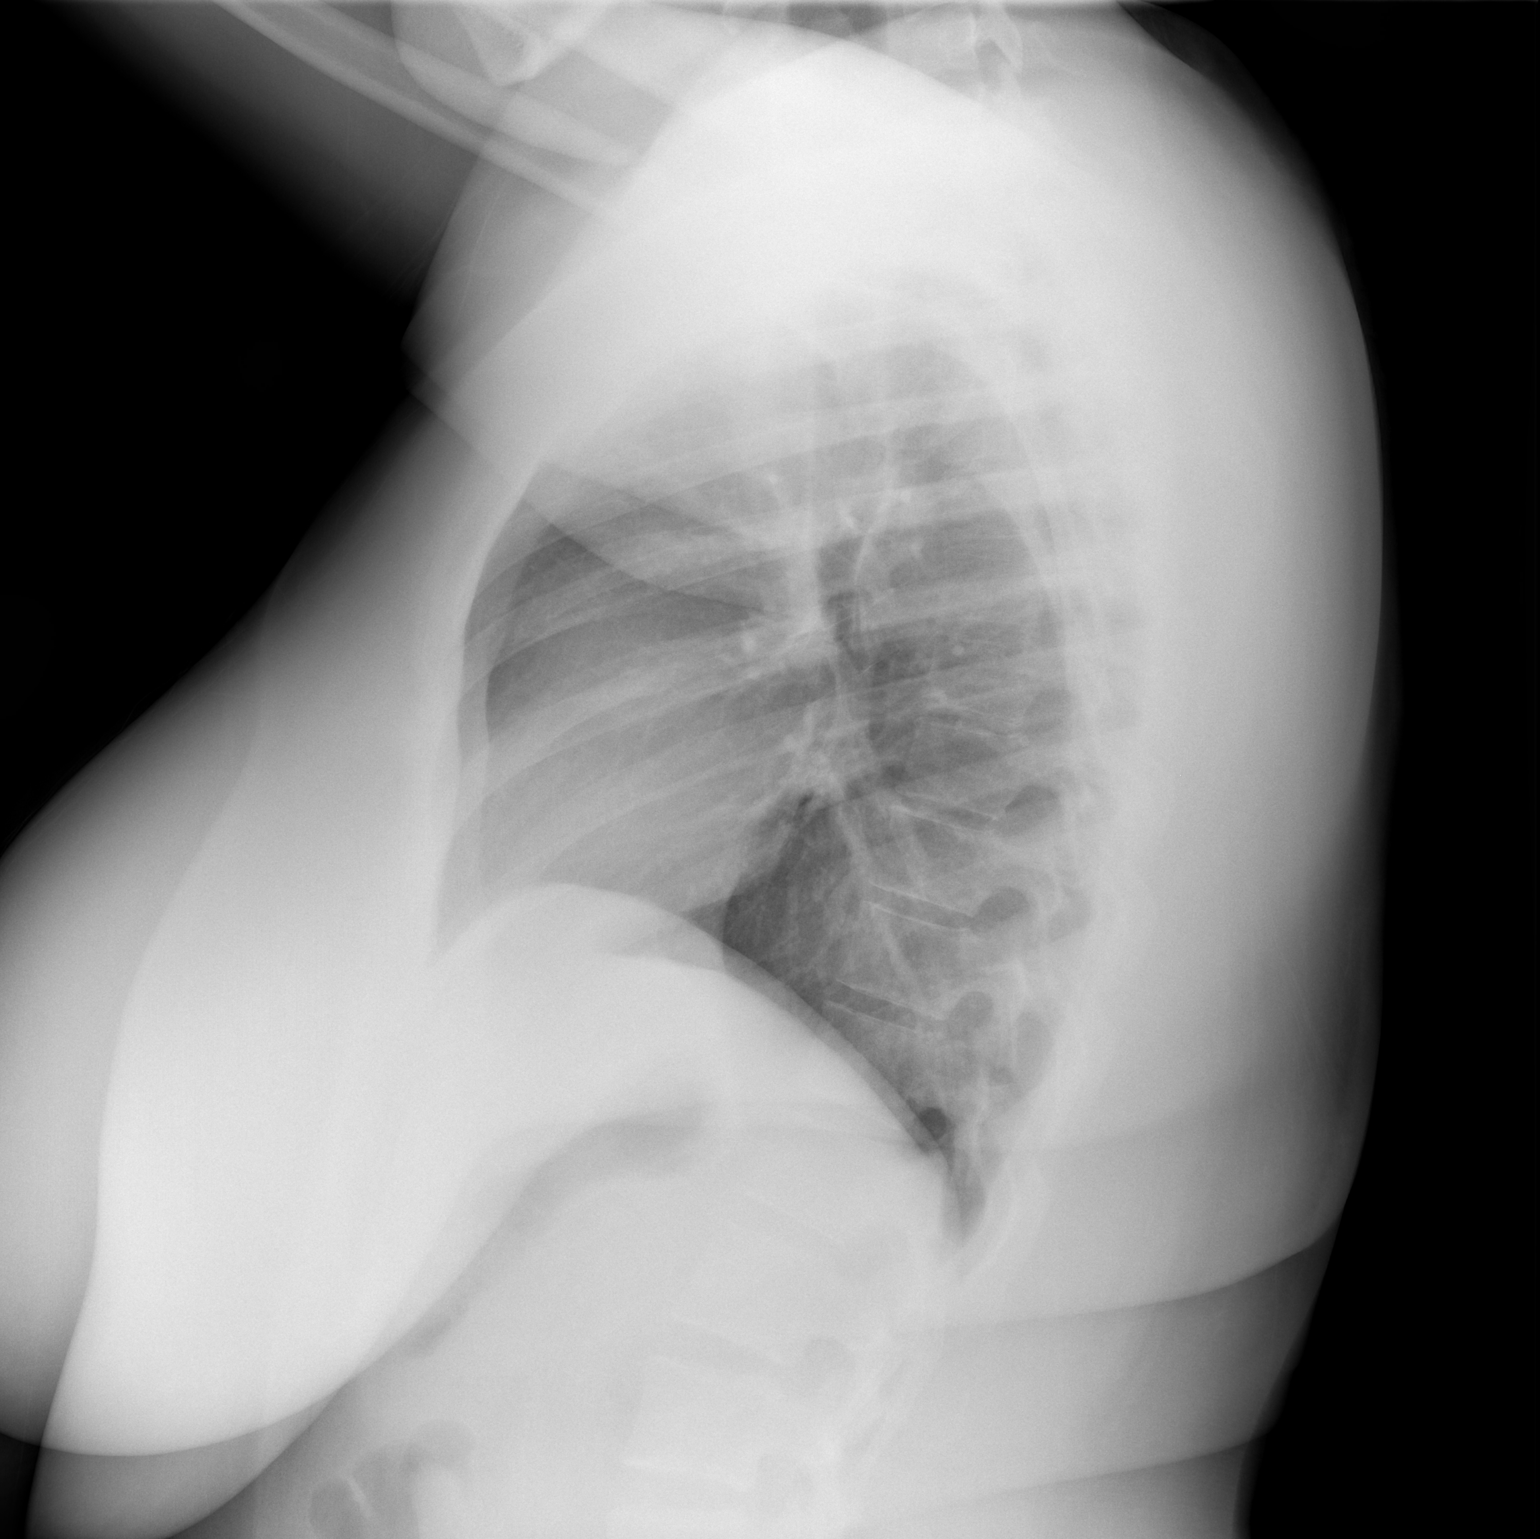

[2 of 2 positions shown; findings below may reference images not displayed]

FINDINGS: The heart size and mediastinal contours are within normal limits.
Both lungs are clear. The visualized skeletal structures are
unremarkable.
IMPRESSION: No acute abnormality of the lungs.

## 2022-04-12 ENCOUNTER — Encounter (HOSPITAL_COMMUNITY): Payer: Self-pay | Admitting: Emergency Medicine

## 2022-04-12 ENCOUNTER — Other Ambulatory Visit: Payer: Self-pay

## 2022-04-12 ENCOUNTER — Emergency Department (HOSPITAL_COMMUNITY): Payer: PRIVATE HEALTH INSURANCE

## 2022-04-12 ENCOUNTER — Emergency Department (HOSPITAL_COMMUNITY)
Admission: EM | Admit: 2022-04-12 | Discharge: 2022-04-12 | Discharge status: Against Medical Advice | Payer: PRIVATE HEALTH INSURANCE | Attending: Emergency Medicine | Admitting: Emergency Medicine

## 2022-04-12 DIAGNOSIS — R079 Chest pain, unspecified: Secondary | ICD-10-CM | POA: Insufficient documentation

## 2022-04-12 DIAGNOSIS — R42 Dizziness and giddiness: Secondary | ICD-10-CM | POA: Diagnosis not present

## 2022-04-12 DIAGNOSIS — R110 Nausea: Secondary | ICD-10-CM | POA: Insufficient documentation

## 2022-04-12 DIAGNOSIS — Z5321 Procedure and treatment not carried out due to patient leaving prior to being seen by health care provider: Secondary | ICD-10-CM | POA: Insufficient documentation

## 2022-04-12 LAB — COMPREHENSIVE METABOLIC PANEL
ALT: 12 U/L (ref 0–44)
AST: 24 U/L (ref 15–41)
Albumin: 3.4 g/dL — ABNORMAL LOW (ref 3.5–5.0)
Alkaline Phosphatase: 57 U/L (ref 38–126)
Anion gap: 11 (ref 5–15)
BUN: <5 mg/dL — ABNORMAL LOW (ref 6–20)
CO2: 22 mmol/L (ref 22–32)
Calcium: 8.5 mg/dL — ABNORMAL LOW (ref 8.9–10.3)
Chloride: 105 mmol/L (ref 98–111)
Creatinine, Ser: 0.75 mg/dL (ref 0.44–1.00)
GFR, Estimated: >60 mL/min (ref >60)
Glucose, Bld: 91 mg/dL (ref 70–99)
Potassium: 3.8 mmol/L (ref 3.5–5.1)
Sodium: 138 mmol/L (ref 135–145)
Total Bilirubin: 0.3 mg/dL (ref 0.3–1.2)
Total Protein: 6.9 g/dL (ref 6.5–8.1)

## 2022-04-12 LAB — CBC WITH DIFFERENTIAL/PLATELET
Abs Immature Granulocytes: 0.01 10*3/uL (ref 0.00–0.07)
Basophils Absolute: 0 10*3/uL (ref 0.0–0.1)
Basophils Relative: 0 %
Eosinophils Absolute: 0.1 10*3/uL (ref 0.0–0.5)
Eosinophils Relative: 2 %
HCT: 37.3 % (ref 36.0–46.0)
Hemoglobin: 11.4 g/dL — ABNORMAL LOW (ref 12.0–15.0)
Immature Granulocytes: 0 %
Lymphocytes Relative: 24 %
Lymphs Abs: 1.5 10*3/uL (ref 0.7–4.0)
MCH: 26.6 pg (ref 26.0–34.0)
MCHC: 30.6 g/dL (ref 30.0–36.0)
MCV: 87.1 fL (ref 80.0–100.0)
Monocytes Absolute: 0.4 10*3/uL (ref 0.1–1.0)
Monocytes Relative: 6 %
Neutro Abs: 4.3 10*3/uL (ref 1.7–7.7)
Neutrophils Relative %: 68 %
Platelets: 366 10*3/uL (ref 150–400)
RBC: 4.28 MIL/uL (ref 3.87–5.11)
RDW: 13.5 % (ref 11.5–15.5)
WBC: 6.4 10*3/uL (ref 4.0–10.5)
nRBC: 0 % (ref 0.0–0.2)

## 2022-04-12 NOTE — ED Notes (Signed)
Patient left without being seen, states she has to pick up daughter off the bus

## 2022-04-12 NOTE — ED Provider Triage Note (Signed)
Emergency Medicine Provider Triage Evaluation Note  Brianna Byrd , a 27 y.o. female  was evaluated in triage.  Pt complains of pressure rating from the left breast posteriorly to the back.  She is generally well, feels generally okay, but over the past 3 days as well to soreness in her left breast radiating to the back.  She has a there is a palpable area of solid material in her left lateral breast.  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  Review of Systems  Positive: Cervical adenopathy Negative: Fever, dyspnea  Physical Exam  BP 131/89 (BP Location: Left Arm)   Pulse (!) 106   Temp 98.4 F (36.9 C)   Resp 16   Ht 5\' 1"  (1.549 m)   Wt 89.4 kg   LMP 04/05/2022 (Approximate)   SpO2 100%   BMI 37.22 kg/m  Gen:   Awake, no distress peaking clearly Resp:  Normal effort no increased work of breathing MSK:   Moves extremities without difficulty no deformity Other:  No complete exam of breast tissue performed in triage  Medical Decision Making  Medically screening exam initiated at 9:13 AM.  Appropriate orders placed.  Brianna Byrd was informed that the remainder of the evaluation will be completed by another provider, this initial triage assessment does not replace that evaluation, and the importance of remaining in the ED until their evaluation is complete.   Carmin Muskrat, MD 04/12/22 [phone]

## 2022-04-12 NOTE — ED Triage Notes (Signed)
Pt reports left sided chest pain that radiates to her back for 3 days. Pain now goes to her shoulder. Also having light-headedness and nausea. Pt has been taking OTC meds with minimal relief. Denies fevers, body aches or chills.

## 2022-05-21 ENCOUNTER — Other Ambulatory Visit: Payer: Self-pay

## 2022-06-23 ENCOUNTER — Other Ambulatory Visit: Payer: Self-pay

## 2022-06-23 DIAGNOSIS — R079 Chest pain, unspecified: Secondary | ICD-10-CM | POA: Diagnosis not present

## 2022-06-23 NOTE — ED Triage Notes (Signed)
Reports midsternal chest pain that radiates to upper back. Denies shortness of breath. Respirations are equal and nonlabored. Skin warm and dry.

## 2022-06-24 ENCOUNTER — Emergency Department (HOSPITAL_BASED_OUTPATIENT_CLINIC_OR_DEPARTMENT_OTHER)
Admission: EM | Admit: 2022-06-24 | Discharge: 2022-06-24 | Disposition: A | Discharge status: Home / Self Care | Payer: PRIVATE HEALTH INSURANCE | Attending: Emergency Medicine | Admitting: Emergency Medicine

## 2022-06-24 ENCOUNTER — Emergency Department (HOSPITAL_BASED_OUTPATIENT_CLINIC_OR_DEPARTMENT_OTHER): Payer: PRIVATE HEALTH INSURANCE

## 2022-06-24 DIAGNOSIS — R0789 Other chest pain: Secondary | ICD-10-CM

## 2022-06-24 LAB — CBC
HCT: 36.6 % (ref 36.0–46.0)
Hemoglobin: 11.5 g/dL — ABNORMAL LOW (ref 12.0–15.0)
MCH: 26.7 pg (ref 26.0–34.0)
MCHC: 31.4 g/dL (ref 30.0–36.0)
MCV: 84.9 fL (ref 80.0–100.0)
Platelets: 309 10*3/uL (ref 150–400)
RBC: 4.31 MIL/uL (ref 3.87–5.11)
RDW: 13.7 % (ref 11.5–15.5)
WBC: 9.7 10*3/uL (ref 4.0–10.5)
nRBC: 0 % (ref 0.0–0.2)

## 2022-06-24 LAB — BASIC METABOLIC PANEL
Anion gap: 10 (ref 5–15)
BUN: 6 mg/dL (ref 6–20)
CO2: 24 mmol/L (ref 22–32)
Calcium: 9.1 mg/dL (ref 8.9–10.3)
Chloride: 104 mmol/L (ref 98–111)
Creatinine, Ser: 0.68 mg/dL (ref 0.44–1.00)
GFR, Estimated: >60 mL/min (ref >60)
Glucose, Bld: 90 mg/dL (ref 70–99)
Potassium: 3.7 mmol/L (ref 3.5–5.1)
Sodium: 138 mmol/L (ref 135–145)

## 2022-06-24 LAB — TROPONIN I (HIGH SENSITIVITY): Troponin I (High Sensitivity): <2 ng/L (ref <18)

## 2022-06-24 LAB — PREGNANCY, URINE: Preg Test, Ur: NEGATIVE

## 2022-06-24 MED ORDER — LIDOCAINE VISCOUS HCL 2 % MT SOLN
15.0000 mL | Freq: Once | OROMUCOSAL | Status: AC
  Administered 2022-06-24: 15 mL via ORAL
  Filled 2022-06-24: qty 15

## 2022-06-24 MED ORDER — ALUM & MAG HYDROXIDE-SIMETH 200-200-20 MG/5ML PO SUSP
30.0000 mL | Freq: Once | ORAL | Status: AC
  Administered 2022-06-24: 30 mL via ORAL
  Filled 2022-06-24: qty 30

## 2022-06-24 MED ORDER — SUCRALFATE 1 G PO TABS: 1.0000 g | ORAL_TABLET | Freq: Three times a day (TID) | ORAL | 0 refills | Status: DC

## 2022-06-24 NOTE — Discharge Instructions (Addendum)
Begin taking Carafate as prescribed.  Continue taking pantoprazole as previously prescribed.  Simethicone is a medication you can try that helps with gas.  This medication is available over-the-counter.  Follow-up with your gastroenterologist in the next few days if not improving, and return to the ER if symptoms significantly worsen or change.

## 2022-06-24 NOTE — ED Provider Notes (Signed)
[place] [facility]   CSN: [id number] Arrival date & time: 06/23/22  2342     History  Chief Complaint  Patient presents with   Chest Pain    Brianna Byrd is a 27 y.o. female.  Patient is a 27 year old female with history of celiac disease and GERD.  Patient presenting today with complaints of chest and back pain.  She describes a several day history of discomfort in the center of her chest that radiates to her back.  She reports frequent belching and "gas".  She denies to me she is having any shortness of breath, nausea, or diaphoresis.  She denies any exertional symptoms.  Symptoms are somewhat worse when she moves and takes a deep breath.  The history is provided by the patient.       Home Medications Prior to Admission medications   Medication Sig Start Date End Date Taking? Authorizing Provider  celecoxib (CELEBREX) 100 MG capsule Take 1 capsule (100 mg total) by mouth 2 (two) times daily. 12/27/20   Dione BoozeGlick, David, MD  cephALEXin (KEFLEX) 500 MG capsule Take 1 capsule (500 mg total) by mouth 4 (four) times daily. 11/19/21   Geoffery Lyonselo, Daniel Ritthaler, MD  Cyanocobalamin (VITAMIN B-12) 5000 MCG SUBL Place 1 tablet (5,000 mcg total) under the tongue daily. 07/28/20 04/17/21  Delano MetzNaveira, Francisco, MD  cyclobenzaprine (FLEXERIL) 10 MG tablet Take 1 tablet (10 mg total) by mouth 3 (three) times daily as needed for muscle spasms. Patient not taking: Reported on 05/18/2021 12/27/20   Dione BoozeGlick, David, MD  diclofenac (VOLTAREN) 75 MG EC tablet Take 1 tablet (75 mg total) by mouth 2 (two) times daily. Patient not taking: Reported on 05/18/2021 04/24/21   Elson AreasSofia, Leslie K, PA-C  erythromycin ophthalmic ointment Place a 1/2 inch ribbon of ointment into the lower eyelid. 03/12/21   Marita KansasAli, Amjad, PA-C  meclizine (ANTIVERT) 25 MG tablet Take 1 tablet (25 mg total) by mouth 3 (three) times daily as needed for dizziness. Patient not taking: Reported on 05/18/2021 09/07/20    Terrilee FilesButler, Michael C, MD  metroNIDAZOLE (FLAGYL) 500 MG tablet Take 1 tablet (500 mg total) by mouth 2 (two) times daily. 01/25/22   Jeanelle MallingLe, Ken, PA  naproxen (NAPROSYN) 500 MG tablet Take 1 tablet (500 mg total) by mouth 2 (two) times daily. 01/25/22   Jeanelle MallingLe, Ken, PA  ondansetron (ZOFRAN) 4 MG tablet Take 1 tablet (4 mg total) by mouth 3 (three) times daily as needed for nausea or vomiting. Take before meals Patient not taking: Reported on 05/18/2021 04/17/21   Arnaldo NatalKennedy-Smith, Colleen M, NP  pantoprazole (PROTONIX) 40 MG tablet Take 1 tablet (40 mg total) by mouth 2 (two) times daily before a meal. Patient not taking: Reported on 05/18/2021 04/17/21   Arnaldo NatalKennedy-Smith, Colleen M, NP  rizatriptan (MAXALT-MLT) 10 MG disintegrating tablet Take 1 tablet earliest onset of migraine.  May repeat in 2 hours if needed.  Maximum 2 tablets in 24 hours. Patient not taking: Reported on 05/18/2021 02/13/21   Drema DallasJaffe, Adam R, DO  simethicone (GAS-X) 80 MG chewable tablet Chew 1 tablet (80 mg total) by mouth every 6 (six) hours as needed for flatulence. 12/27/20   Dione BoozeGlick, David, MD  topiramate (TOPAMAX) 50 MG tablet Take 1/2 tablet at bedtime for one week, then increase to 1 tablet at bedtime 02/13/21   Drema DallasJaffe, Adam R, DO  valACYclovir (VALTREX) 1000 MG tablet Take 1 tablet (1,000 mg total) by mouth 3 (three) times daily. 11/19/21   Takiera Mayo,  Riley Lam, MD  esomeprazole (NEXIUM) 40 MG capsule Take 1 capsule (40 mg total) by mouth daily before breakfast. 08/28/19 05/18/20  Mansouraty, Netty Starring., MD  famotidine (PEPCID) 20 MG tablet Take 1 tablet (20 mg total) by mouth 2 (two) times daily. 10/24/19 05/18/20  Renne Crigler, PA-C  fluticasone (FLONASE) 50 MCG/ACT nasal spray Place 2 sprays into both nostrils daily. Patient not taking: Reported on 01/27/2019 06/05/18 05/01/19  Palumbo, April, MD  medroxyPROGESTERone (PROVERA) 10 MG tablet Take 1 tablet (10 mg total) by mouth daily for 7 days. 03/04/19 05/01/19  Sharlene Dory, DO  SUMAtriptan  (IMITREX) 100 MG tablet Take 1 tablet (100 mg total) by mouth every 2 (two) hours as needed for migraine. May repeat in 2 hours if headache persists or recurs. Patient not taking: Reported on 03/27/2019 03/04/19 05/01/19  Sharlene Dory, DO      Allergies    Peach [prunus persica] and Miconazole    Review of Systems   Review of Systems  All other systems reviewed and are negative.   Physical Exam Updated Vital Signs BP 113/79 (BP Location: Right Arm)   Pulse 90   Temp 98.1 F (36.7 C)   Resp 16   Ht 5\' 1"  (1.549 m)   Wt 94.3 kg   SpO2 100%   BMI 39.30 kg/m  Physical Exam Vitals and nursing note reviewed.  Constitutional:      General: She is not in acute distress.    Appearance: She is well-developed. She is not diaphoretic.  HENT:     Head: Normocephalic and atraumatic.  Cardiovascular:     Rate and Rhythm: Normal rate and regular rhythm.     Heart sounds: No murmur heard.    No friction rub. No gallop.  Pulmonary:     Effort: Pulmonary effort is normal. No respiratory distress.     Breath sounds: Normal breath sounds. No wheezing.  Abdominal:     General: Bowel sounds are normal. There is no distension.     Palpations: Abdomen is soft.     Tenderness: There is no abdominal tenderness.  Musculoskeletal:        General: Normal range of motion.     Cervical back: Normal range of motion and neck supple.     Right lower leg: No tenderness. No edema.     Left lower leg: No tenderness. No edema.  Skin:    General: Skin is warm and dry.  Neurological:     General: No focal deficit present.     Mental Status: She is alert and oriented to person, place, and time.     ED Results / Procedures / Treatments   Labs (all labs ordered are listed, but only abnormal results are displayed) Labs Reviewed  CBC - Abnormal; Notable for the following components:      Result Value   Hemoglobin 11.5 (*)    All other components within normal limits  BASIC METABOLIC PANEL   PREGNANCY, URINE  TROPONIN I (HIGH SENSITIVITY)    EKG EKG Interpretation  Date/Time:  Saturday June 23 2022 23:53:31 EDT Ventricular Rate:  88 PR Interval:  138 QRS Duration: 78 QT Interval:  360 QTC Calculation: 435 R Axis:   76 Text Interpretation: Normal sinus rhythm Normal ECG When compared with ECG of 12-Apr-2022 08:35, No significant change was found Confirmed by Geoffery Lyons ([id number]) on 06/23/2022 11:55:53 PM  Radiology No results found.  Procedures Procedures    Medications Ordered in ED Medications -  No data to display  ED Course/ Medical Decision Making/ A&P  Patient is a 27 year old female with history of celiac disease and GERD presenting with complaints of chest pain radiating through to her back.  This has been ongoing for the past several days and began in the absence of any injury or trauma.  She is concerned as though she may have "gas".  Workup initiated including CBC, metabolic panel, and troponin, all of which were negative.  Urine pregnancy test also negative.  Chest x-ray shows no acute process.  Patient given a GI cocktail and did experience some relief.  At this point, I highly doubt a cardiac etiology and also highly doubt other emergent pathology such as pulmonary embolism or aortic dissection.  Chest x-ray does not show pneumothorax.  I feel as though I have ruled out emergent pathology and that patient can safely be discharged.  Symptoms possibly related to reflux or musculoskeletal.  I will advise Carafate in addition to her pantoprazole and see if this helps.  To follow-up with her GI doctor in the future if not improving and return to the ER if symptoms worsen or change.  Final Clinical Impression(s) / ED Diagnoses Final diagnoses:  None    Rx / DC Orders ED Discharge Orders     None         Geoffery Lyons, MD 06/24/22 0130

## 2022-07-26 ENCOUNTER — Emergency Department (HOSPITAL_BASED_OUTPATIENT_CLINIC_OR_DEPARTMENT_OTHER)
Admission: EM | Admit: 2022-07-26 | Discharge: 2022-07-26 | Disposition: A | Discharge status: Home / Self Care | Payer: Medicaid Other | Attending: Emergency Medicine | Admitting: Emergency Medicine

## 2022-07-26 ENCOUNTER — Other Ambulatory Visit: Payer: Self-pay

## 2022-07-26 ENCOUNTER — Encounter (HOSPITAL_BASED_OUTPATIENT_CLINIC_OR_DEPARTMENT_OTHER): Payer: Self-pay

## 2022-07-26 DIAGNOSIS — N760 Acute vaginitis: Secondary | ICD-10-CM | POA: Insufficient documentation

## 2022-07-26 DIAGNOSIS — N898 Other specified noninflammatory disorders of vagina: Secondary | ICD-10-CM | POA: Diagnosis present

## 2022-07-26 LAB — URINALYSIS, W/ REFLEX TO CULTURE (INFECTION SUSPECTED)
Bilirubin Urine: NEGATIVE
Glucose, UA: NEGATIVE mg/dL
Hgb urine dipstick: NEGATIVE
Ketones, ur: NEGATIVE mg/dL
Leukocytes,Ua: NEGATIVE
Nitrite: NEGATIVE
Protein, ur: NEGATIVE mg/dL
Specific Gravity, Urine: 1.025 (ref 1.005–1.030)
Squamous Epithelial / HPF: NONE SEEN /HPF (ref 0–5)
pH: 7 (ref 5.0–8.0)

## 2022-07-26 LAB — PREGNANCY, URINE: Preg Test, Ur: NEGATIVE

## 2022-07-26 LAB — WET PREP, GENITAL
Clue Cells Wet Prep HPF POC: NONE SEEN
Sperm: NONE SEEN
Trich, Wet Prep: NONE SEEN
WBC, Wet Prep HPF POC: <10 (ref <10)
Yeast Wet Prep HPF POC: NONE SEEN

## 2022-07-26 MED ORDER — FLUCONAZOLE 150 MG PO TABS
150.0000 mg | ORAL_TABLET | Freq: Once | ORAL | Status: AC
  Administered 2022-07-26: 150 mg via ORAL
  Filled 2022-07-26: qty 1

## 2022-07-26 MED ORDER — FLUCONAZOLE 150 MG PO TABS: 150.0000 mg | ORAL_TABLET | Freq: Every day | ORAL | 0 refills | Status: DC

## 2022-07-26 NOTE — ED Provider Notes (Signed)
[facility] [facility] Provider Note   CSN: [id number] Arrival date & time: 07/26/22  4034     History  Chief Complaint  Patient presents with   Vaginal Itching    Brianna Byrd is a 27 y.o. female.  HPI 27 year old female presents with concern for a yeast infection.  For the past 3 days she has been having vaginal itching, dysuria, and some swelling to her left vagina.  She states she has had intercourse but thinks that her partner was using a latex condom and she is allergic to latex.  She is allergic to Monistat and so usually requires oral treatment for yeast infections.  She has noticed a little bit of clumpy white discharge.  She denies any urinary frequency.  No abdominal pain, flank pain, fever.  Home Medications Prior to Admission medications   Medication Sig Start Date End Date Taking? Authorizing Provider  fluconazole (DIFLUCAN) 150 MG tablet Take 1 tablet (150 mg total) by mouth daily. 07/29/22  Yes Pricilla Loveless, MD  celecoxib (CELEBREX) 100 MG capsule Take 1 capsule (100 mg total) by mouth 2 (two) times daily. 12/27/20   Dione Booze, MD  cephALEXin (KEFLEX) 500 MG capsule Take 1 capsule (500 mg total) by mouth 4 (four) times daily. 11/19/21   Geoffery Lyons, MD  Cyanocobalamin (VITAMIN B-12) 5000 MCG SUBL Place 1 tablet (5,000 mcg total) under the tongue daily. 07/28/20 04/17/21  Delano Metz, MD  cyclobenzaprine (FLEXERIL) 10 MG tablet Take 1 tablet (10 mg total) by mouth 3 (three) times daily as needed for muscle spasms. Patient not taking: Reported on 05/18/2021 12/27/20   Dione Booze, MD  diclofenac (VOLTAREN) 75 MG EC tablet Take 1 tablet (75 mg total) by mouth 2 (two) times daily. Patient not taking: Reported on 05/18/2021 04/24/21   Elson Areas, PA-C  erythromycin ophthalmic ointment Place a 1/2 inch ribbon of ointment into the lower eyelid. 03/12/21   Marita Kansas, PA-C  meclizine (ANTIVERT) 25 MG tablet Take 1 tablet (25 mg  total) by mouth 3 (three) times daily as needed for dizziness. Patient not taking: Reported on 05/18/2021 09/07/20   Terrilee Files, MD  metroNIDAZOLE (FLAGYL) 500 MG tablet Take 1 tablet (500 mg total) by mouth 2 (two) times daily. 01/25/22   Jeanelle Malling, PA  naproxen (NAPROSYN) 500 MG tablet Take 1 tablet (500 mg total) by mouth 2 (two) times daily. 01/25/22   Jeanelle Malling, PA  ondansetron (ZOFRAN) 4 MG tablet Take 1 tablet (4 mg total) by mouth 3 (three) times daily as needed for nausea or vomiting. Take before meals Patient not taking: Reported on 05/18/2021 04/17/21   Arnaldo Natal, NP  pantoprazole (PROTONIX) 40 MG tablet Take 1 tablet (40 mg total) by mouth 2 (two) times daily before a meal. Patient not taking: Reported on 05/18/2021 04/17/21   Arnaldo Natal, NP  rizatriptan (MAXALT-MLT) 10 MG disintegrating tablet Take 1 tablet earliest onset of migraine.  May repeat in 2 hours if needed.  Maximum 2 tablets in 24 hours. Patient not taking: Reported on 05/18/2021 02/13/21   Drema Dallas, DO  simethicone (GAS-X) 80 MG chewable tablet Chew 1 tablet (80 mg total) by mouth every 6 (six) hours as needed for flatulence. 12/27/20   Dione Booze, MD  sucralfate (CARAFATE) 1 g tablet Take 1 tablet (1 g total) by mouth 4 (four) times daily -  with meals and at bedtime. 06/24/22   Geoffery Lyons, MD  topiramate (  TOPAMAX) 50 MG tablet Take 1/2 tablet at bedtime for one week, then increase to 1 tablet at bedtime 02/13/21   Drema Dallas, DO  valACYclovir (VALTREX) 1000 MG tablet Take 1 tablet (1,000 mg total) by mouth 3 (three) times daily. 11/19/21   Geoffery Lyons, MD  esomeprazole (NEXIUM) 40 MG capsule Take 1 capsule (40 mg total) by mouth daily before breakfast. 08/28/19 05/18/20  Mansouraty, Netty Starring., MD  famotidine (PEPCID) 20 MG tablet Take 1 tablet (20 mg total) by mouth 2 (two) times daily. 10/24/19 05/18/20  Renne Crigler, PA-C  fluticasone (FLONASE) 50 MCG/ACT nasal spray Place 2 sprays into both  nostrils daily. Patient not taking: Reported on 01/27/2019 06/05/18 05/01/19  Palumbo, April, MD  medroxyPROGESTERone (PROVERA) 10 MG tablet Take 1 tablet (10 mg total) by mouth daily for 7 days. 03/04/19 05/01/19  Sharlene Dory, DO  SUMAtriptan (IMITREX) 100 MG tablet Take 1 tablet (100 mg total) by mouth every 2 (two) hours as needed for migraine. May repeat in 2 hours if headache persists or recurs. Patient not taking: Reported on 03/27/2019 03/04/19 05/01/19  Sharlene Dory, DO      Allergies    Peach [prunus persica] and Miconazole    Review of Systems   Review of Systems  Constitutional:  Negative for fever.  Gastrointestinal:  Negative for abdominal pain.  Genitourinary:  Positive for dysuria and vaginal discharge. Negative for flank pain.  Musculoskeletal:  Negative for back pain.    Physical Exam Updated Vital Signs BP 110/74 (BP Location: Right Arm)   Pulse 80   Temp 98.4 F (36.9 C) (Oral)   Resp 17   Ht 5\' 1"  (1.549 m)   Wt 86.2 kg   SpO2 100%   BMI 35.90 kg/m  Physical Exam Vitals and nursing note reviewed. Exam conducted with a chaperone present.  Constitutional:      General: She is not in acute distress.    Appearance: She is well-developed. She is not ill-appearing or diaphoretic.  HENT:     Head: Normocephalic and atraumatic.  Cardiovascular:     Rate and Rhythm: Normal rate and regular rhythm.     Heart sounds: Normal heart sounds.  Pulmonary:     Effort: Pulmonary effort is normal.     Breath sounds: Normal breath sounds.  Abdominal:     General: There is no distension.     Palpations: Abdomen is soft.     Tenderness: There is no abdominal tenderness. There is no right CVA tenderness or left CVA tenderness.  Genitourinary:    Vagina: Vaginal discharge (mild) present.     Cervix: No friability or cervical bleeding.     Comments: There is some mild swelling to left labia. No erythema. Patient reports this is typical with her yeast  infections. No lesions. Appears to have some mild lesions on her cervix, unclear if chronic vs inflammation Skin:    General: Skin is warm and dry.  Neurological:     Mental Status: She is alert.     ED Results / Procedures / Treatments   Labs (all labs ordered are listed, but only abnormal results are displayed) Labs Reviewed  URINALYSIS, W/ REFLEX TO CULTURE (INFECTION SUSPECTED) - Abnormal; Notable for the following components:      Result Value   Bacteria, UA RARE (*)    All other components within normal limits  WET PREP, GENITAL  PREGNANCY, URINE  GC/CHLAMYDIA PROBE AMP ([place]) NOT AT [facility]  EKG None  Radiology No results found.  Procedures Procedures    Medications Ordered in ED Medications  fluconazole (DIFLUCAN) tablet 150 mg (150 mg Oral Given 07/26/22 0854)    ED Course/ Medical Decision Making/ A&P                             Medical Decision Making Amount and/or Complexity of Data Reviewed Labs: ordered.    Details: UA unremarkable.  Not pregnant.  Wet prep with no white blood cells or yeast  Risk Prescription drug management.   Patient presents with concern for yeast vaginitis.  While I did not see any overt yeast, there is a small amount of discharge.  Given her concern and recurrent symptoms I think is reasonable to treat with Diflucan.  We have also sent GC/chlamydia testing although she has low concern for STI.  No abdominal symptoms.  No UTI.  Given a dose of Diflucan here and will be given a repeat dose if needed in 3 days.  Will give return precautions.  I did stress the importance of following up with a GYN for Pap smear testing as she has not had one in a while.        Final Clinical Impression(s) / ED Diagnoses Final diagnoses:  Acute vaginitis    Rx / DC Orders ED Discharge Orders          Ordered    fluconazole (DIFLUCAN) 150 MG tablet  Daily        07/26/22 0926              Pricilla Loveless, MD 07/26/22  [phone]

## 2022-07-26 NOTE — ED Triage Notes (Signed)
"  History of yeast infections, vagina has been irritated x 4 days" per pt. Denies dysuria, denies discharge

## 2022-07-26 NOTE — Discharge Instructions (Addendum)
We are treating you for possible yeast infection with Diflucan.  You were given the first dose today, if you are not having improvement, take the next dose in 72 hours.

## 2022-07-27 LAB — GC/CHLAMYDIA PROBE AMP (~~LOC~~) NOT AT ARMC
Chlamydia: NEGATIVE
Comment: NEGATIVE
Comment: NORMAL
Neisseria Gonorrhea: NEGATIVE

## 2022-09-12 ENCOUNTER — Emergency Department (HOSPITAL_BASED_OUTPATIENT_CLINIC_OR_DEPARTMENT_OTHER)
Admission: EM | Admit: 2022-09-12 | Discharge: 2022-09-13 | Disposition: A | Discharge status: Home / Self Care | Payer: Medicaid Other | Attending: Emergency Medicine | Admitting: Emergency Medicine

## 2022-09-12 ENCOUNTER — Other Ambulatory Visit: Payer: Self-pay

## 2022-09-12 ENCOUNTER — Encounter (HOSPITAL_BASED_OUTPATIENT_CLINIC_OR_DEPARTMENT_OTHER): Payer: Self-pay

## 2022-09-12 DIAGNOSIS — R300 Dysuria: Secondary | ICD-10-CM | POA: Insufficient documentation

## 2022-09-12 DIAGNOSIS — R309 Painful micturition, unspecified: Secondary | ICD-10-CM | POA: Diagnosis present

## 2022-09-12 LAB — URINALYSIS, ROUTINE W REFLEX MICROSCOPIC
Bilirubin Urine: NEGATIVE
Glucose, UA: NEGATIVE mg/dL
Hgb urine dipstick: NEGATIVE
Ketones, ur: NEGATIVE mg/dL
Leukocytes,Ua: NEGATIVE
Nitrite: NEGATIVE
Protein, ur: NEGATIVE mg/dL
Specific Gravity, Urine: 1.025 (ref 1.005–1.030)
pH: 7 (ref 5.0–8.0)

## 2022-09-12 LAB — PREGNANCY, URINE: Preg Test, Ur: NEGATIVE

## 2022-09-12 NOTE — ED Provider Notes (Signed)
[place] [facility]  Provider Note  CSN: [id number] Arrival date & time: 09/12/22 2248  History Chief Complaint  Patient presents with   Dysuria    Brianna Byrd is a 27 y.o. female with no significant PMH reports about a week of burning with urination, foul odor to urine. She reports having intercourse frequently this week but has not been using any latex, lubrication gels, or spermicidals. She has a latex allergy. She was seen for vaginitis in May, had negative swabs then. Followed up in Ob/Gyn a short time later with dysuria and had negative labs then. Given Rx for Abx then and symptoms improved some. She denies any vaginal discharge or swelling. No fever or flank pain.    Home Medications Prior to Admission medications   Medication Sig Start Date End Date Taking? Authorizing Provider  celecoxib (CELEBREX) 100 MG capsule Take 1 capsule (100 mg total) by mouth 2 (two) times daily. 12/27/20   Dione Booze, MD  cephALEXin (KEFLEX) 500 MG capsule Take 1 capsule (500 mg total) by mouth 4 (four) times daily. 11/19/21   Geoffery Lyons, MD  Cyanocobalamin (VITAMIN B-12) 5000 MCG SUBL Place 1 tablet (5,000 mcg total) under the tongue daily. 07/28/20 04/17/21  Delano Metz, MD  cyclobenzaprine (FLEXERIL) 10 MG tablet Take 1 tablet (10 mg total) by mouth 3 (three) times daily as needed for muscle spasms. Patient not taking: Reported on 05/18/2021 12/27/20   Dione Booze, MD  diclofenac (VOLTAREN) 75 MG EC tablet Take 1 tablet (75 mg total) by mouth 2 (two) times daily. Patient not taking: Reported on 05/18/2021 04/24/21   Elson Areas, PA-C  erythromycin ophthalmic ointment Place a 1/2 inch ribbon of ointment into the lower eyelid. 03/12/21   Marita Kansas, PA-C  fluconazole (DIFLUCAN) 150 MG tablet Take 1 tablet (150 mg total) by mouth daily. 07/29/22   Pricilla Loveless, MD  meclizine (ANTIVERT) 25 MG tablet Take 1 tablet (25 mg total) by mouth 3 (three) times  daily as needed for dizziness. Patient not taking: Reported on 05/18/2021 09/07/20   Terrilee Files, MD  metroNIDAZOLE (FLAGYL) 500 MG tablet Take 1 tablet (500 mg total) by mouth 2 (two) times daily. 01/25/22   Jeanelle Malling, PA  naproxen (NAPROSYN) 500 MG tablet Take 1 tablet (500 mg total) by mouth 2 (two) times daily. 01/25/22   Jeanelle Malling, PA  ondansetron (ZOFRAN) 4 MG tablet Take 1 tablet (4 mg total) by mouth 3 (three) times daily as needed for nausea or vomiting. Take before meals Patient not taking: Reported on 05/18/2021 04/17/21   Arnaldo Natal, NP  pantoprazole (PROTONIX) 40 MG tablet Take 1 tablet (40 mg total) by mouth 2 (two) times daily before a meal. Patient not taking: Reported on 05/18/2021 04/17/21   Arnaldo Natal, NP  rizatriptan (MAXALT-MLT) 10 MG disintegrating tablet Take 1 tablet earliest onset of migraine.  May repeat in 2 hours if needed.  Maximum 2 tablets in 24 hours. Patient not taking: Reported on 05/18/2021 02/13/21   Drema Dallas, DO  simethicone (GAS-X) 80 MG chewable tablet Chew 1 tablet (80 mg total) by mouth every 6 (six) hours as needed for flatulence. 12/27/20   Dione Booze, MD  sucralfate (CARAFATE) 1 g tablet Take 1 tablet (1 g total) by mouth 4 (four) times daily -  with meals and at bedtime. 06/24/22   Geoffery Lyons, MD  topiramate (TOPAMAX) 50 MG tablet Take 1/2 tablet at bedtime for  one week, then increase to 1 tablet at bedtime 02/13/21   Drema Dallas, DO  valACYclovir (VALTREX) 1000 MG tablet Take 1 tablet (1,000 mg total) by mouth 3 (three) times daily. 11/19/21   Geoffery Lyons, MD  esomeprazole (NEXIUM) 40 MG capsule Take 1 capsule (40 mg total) by mouth daily before breakfast. 08/28/19 05/18/20  Mansouraty, Netty Starring., MD  famotidine (PEPCID) 20 MG tablet Take 1 tablet (20 mg total) by mouth 2 (two) times daily. 10/24/19 05/18/20  Renne Crigler, PA-C  fluticasone (FLONASE) 50 MCG/ACT nasal spray Place 2 sprays into both nostrils daily. Patient not  taking: Reported on 01/27/2019 06/05/18 05/01/19  Palumbo, April, MD  medroxyPROGESTERone (PROVERA) 10 MG tablet Take 1 tablet (10 mg total) by mouth daily for 7 days. 03/04/19 05/01/19  Sharlene Dory, DO  SUMAtriptan (IMITREX) 100 MG tablet Take 1 tablet (100 mg total) by mouth every 2 (two) hours as needed for migraine. May repeat in 2 hours if headache persists or recurs. Patient not taking: Reported on 03/27/2019 03/04/19 05/01/19  Sharlene Dory, DO     Allergies    Peach [prunus persica] and Miconazole   Review of Systems   Review of Systems Please see HPI for pertinent positives and negatives  Physical Exam BP 123/84 (BP Location: Left Arm)   Pulse 90   Temp 97.8 F (36.6 C)   Resp 18   Ht 5\' 1"  (1.549 m)   Wt 89.4 kg   SpO2 100%   BMI 37.22 kg/m   Physical Exam Vitals and nursing note reviewed. Exam conducted with a chaperone present.  Constitutional:      Appearance: Normal appearance.  HENT:     Head: Normocephalic and atraumatic.     Nose: Nose normal.     Mouth/Throat:     Mouth: Mucous membranes are moist.  Eyes:     Extraocular Movements: Extraocular movements intact.     Conjunctiva/sclera: Conjunctivae normal.  Cardiovascular:     Rate and Rhythm: Normal rate.  Pulmonary:     Effort: Pulmonary effort is normal.     Breath sounds: Normal breath sounds.  Abdominal:     General: Abdomen is flat.     Palpations: Abdomen is soft.     Tenderness: There is no abdominal tenderness.  Musculoskeletal:        General: No swelling. Normal range of motion.     Cervical back: Neck supple.  Skin:    General: Skin is warm and dry.  Neurological:     General: No focal deficit present.     Mental Status: She is alert.  Psychiatric:        Mood and Affect: Mood normal.     ED Results / Procedures / Treatments   EKG None  Procedures Procedures  Medications Ordered in the ED Medications - No data to display  Initial Impression and Plan   Patient here with dysuria and urine odor. UA is normal. HCG is neg. Will check pelvic exam, she is requesting STI check.   ED Course   Clinical Course as of 09/13/22 0101  Thu Sep 13, 2022  0059 Pelvic exam: normal external genitalia, no discharge, cervix is normal.  Wet prep is neg for yeast, trich or BV. Some WBC of unclear significance. She was advised her dysuria may be from a non-infectious etiology. Recommend she take OTC AZO for symptom relief, follow up with Urology for further investigation if symptoms continue.  [CS]    Clinical  Course User Index [CS] Pollyann Savoy, MD     MDM Rules/Calculators/A&P Medical Decision Making Problems Addressed: Dysuria: acute illness or injury  Amount and/or Complexity of Data Reviewed Labs: ordered. Decision-making details documented in ED Course.  Risk OTC drugs.     Final Clinical Impression(s) / ED Diagnoses Final diagnoses:  Dysuria    Rx / DC Orders ED Discharge Orders     None        Pollyann Savoy, MD 09/13/22 [phone]

## 2022-09-12 NOTE — ED Triage Notes (Signed)
Pt arrives with c/o dysuria that started last week. Pt denies hematuria, discharge, or fevers.

## 2022-09-13 LAB — WET PREP, GENITAL
Clue Cells Wet Prep HPF POC: NONE SEEN
Sperm: NONE SEEN
Trich, Wet Prep: NONE SEEN
WBC, Wet Prep HPF POC: >=10 — AB (ref <10)
Yeast Wet Prep HPF POC: NONE SEEN

## 2022-09-14 LAB — GC/CHLAMYDIA PROBE AMP (~~LOC~~) NOT AT ARMC
Chlamydia: NEGATIVE
Comment: NEGATIVE
Comment: NORMAL
Neisseria Gonorrhea: NEGATIVE

## 2022-09-22 ENCOUNTER — Other Ambulatory Visit: Payer: Self-pay

## 2022-09-22 ENCOUNTER — Emergency Department (HOSPITAL_BASED_OUTPATIENT_CLINIC_OR_DEPARTMENT_OTHER)
Admission: EM | Admit: 2022-09-22 | Discharge: 2022-09-22 | Disposition: A | Discharge status: Home / Self Care | Payer: Medicaid Other | Attending: Emergency Medicine | Admitting: Emergency Medicine

## 2022-09-22 ENCOUNTER — Encounter (HOSPITAL_BASED_OUTPATIENT_CLINIC_OR_DEPARTMENT_OTHER): Payer: Self-pay

## 2022-09-22 DIAGNOSIS — N898 Other specified noninflammatory disorders of vagina: Secondary | ICD-10-CM | POA: Diagnosis present

## 2022-09-22 DIAGNOSIS — N760 Acute vaginitis: Secondary | ICD-10-CM | POA: Diagnosis not present

## 2022-09-22 DIAGNOSIS — B9689 Other specified bacterial agents as the cause of diseases classified elsewhere: Secondary | ICD-10-CM | POA: Diagnosis not present

## 2022-09-22 LAB — URINALYSIS, MICROSCOPIC (REFLEX): RBC / HPF: NONE SEEN RBC/hpf (ref 0–5)

## 2022-09-22 LAB — WET PREP, GENITAL
Sperm: NONE SEEN
Trich, Wet Prep: NONE SEEN
WBC, Wet Prep HPF POC: >=10 — AB (ref <10)
Yeast Wet Prep HPF POC: NONE SEEN

## 2022-09-22 LAB — URINALYSIS, ROUTINE W REFLEX MICROSCOPIC
Bilirubin Urine: NEGATIVE
Glucose, UA: NEGATIVE mg/dL
Hgb urine dipstick: NEGATIVE
Ketones, ur: NEGATIVE mg/dL
Nitrite: NEGATIVE
Protein, ur: 30 mg/dL — AB
Specific Gravity, Urine: 1.025 (ref 1.005–1.030)
pH: 7 (ref 5.0–8.0)

## 2022-09-22 LAB — PREGNANCY, URINE: Preg Test, Ur: NEGATIVE

## 2022-09-22 MED ORDER — FLUCONAZOLE 150 MG PO TABS: 150.0000 mg | ORAL_TABLET | Freq: Every day | ORAL | 0 refills | Status: DC

## 2022-09-22 MED ORDER — METRONIDAZOLE 500 MG PO TABS: 500.0000 mg | ORAL_TABLET | Freq: Two times a day (BID) | ORAL | 0 refills | Status: DC

## 2022-09-22 NOTE — ED Provider Notes (Addendum)
[place] [facility] AT [facility] Provider Note   CSN: [id number] Arrival date & time: 09/22/22  0945     History Chief Complaint  Patient presents with   Vaginal Discharge    Brianna Byrd is a 27 y.o. female presents emerged from today for evaluation of vaginal discharge starting last night.  Patient reports there was a large amount and was very foul-smelling.  She reports that she continues to have intermittent dysuria and has a follow-up appointment with her gynecologist at the end of this month.  She reports that she still try to get in with urology.  She reports that she is sexually active with 1 partner however is concerned for STDs.  She denies any urgency or frequency of urination.  She reports that she has had UTIs before and this does not feel like that.  She denies any abdominal pain, nausea, vomiting, diarrhea, constipation, fevers, chills, or rash.  She is allergic to Monistat.  Denies any tobacco, EtOH, or drug use.   Vaginal Discharge Associated symptoms: dysuria   Associated symptoms: no abdominal pain, no fever, no nausea and no vomiting        Home Medications Prior to Admission medications   Medication Sig Start Date End Date Taking? Authorizing Provider  celecoxib (CELEBREX) 100 MG capsule Take 1 capsule (100 mg total) by mouth 2 (two) times daily. 12/27/20   Dione Booze, MD  cephALEXin (KEFLEX) 500 MG capsule Take 1 capsule (500 mg total) by mouth 4 (four) times daily. 11/19/21   Geoffery Lyons, MD  Cyanocobalamin (VITAMIN B-12) 5000 MCG SUBL Place 1 tablet (5,000 mcg total) under the tongue daily. 07/28/20 04/17/21  Delano Metz, MD  cyclobenzaprine (FLEXERIL) 10 MG tablet Take 1 tablet (10 mg total) by mouth 3 (three) times daily as needed for muscle spasms. Patient not taking: Reported on 05/18/2021 12/27/20   Dione Booze, MD  diclofenac (VOLTAREN) 75 MG EC tablet Take 1 tablet (75 mg total) by mouth 2 (two) times daily. Patient  not taking: Reported on 05/18/2021 04/24/21   Elson Areas, PA-C  erythromycin ophthalmic ointment Place a 1/2 inch ribbon of ointment into the lower eyelid. 03/12/21   Marita Kansas, PA-C  fluconazole (DIFLUCAN) 150 MG tablet Take 1 tablet (150 mg total) by mouth daily. 07/29/22   Pricilla Loveless, MD  meclizine (ANTIVERT) 25 MG tablet Take 1 tablet (25 mg total) by mouth 3 (three) times daily as needed for dizziness. Patient not taking: Reported on 05/18/2021 09/07/20   Terrilee Files, MD  metroNIDAZOLE (FLAGYL) 500 MG tablet Take 1 tablet (500 mg total) by mouth 2 (two) times daily. 09/22/22   Achille Rich, PA-C  naproxen (NAPROSYN) 500 MG tablet Take 1 tablet (500 mg total) by mouth 2 (two) times daily. 01/25/22   Jeanelle Malling, PA  ondansetron (ZOFRAN) 4 MG tablet Take 1 tablet (4 mg total) by mouth 3 (three) times daily as needed for nausea or vomiting. Take before meals Patient not taking: Reported on 05/18/2021 04/17/21   Arnaldo Natal, NP  pantoprazole (PROTONIX) 40 MG tablet Take 1 tablet (40 mg total) by mouth 2 (two) times daily before a meal. Patient not taking: Reported on 05/18/2021 04/17/21   Arnaldo Natal, NP  rizatriptan (MAXALT-MLT) 10 MG disintegrating tablet Take 1 tablet earliest onset of migraine.  May repeat in 2 hours if needed.  Maximum 2 tablets in 24 hours. Patient not taking: Reported on 05/18/2021 02/13/21   Drema Dallas, DO  simethicone (GAS-X) 80 MG chewable tablet Chew 1 tablet (80 mg total) by mouth every 6 (six) hours as needed for flatulence. 12/27/20   Dione Booze, MD  sucralfate (CARAFATE) 1 g tablet Take 1 tablet (1 g total) by mouth 4 (four) times daily -  with meals and at bedtime. 06/24/22   Geoffery Lyons, MD  topiramate (TOPAMAX) 50 MG tablet Take 1/2 tablet at bedtime for one week, then increase to 1 tablet at bedtime 02/13/21   Drema Dallas, DO  valACYclovir (VALTREX) 1000 MG tablet Take 1 tablet (1,000 mg total) by mouth 3 (three) times daily. 11/19/21    Geoffery Lyons, MD  esomeprazole (NEXIUM) 40 MG capsule Take 1 capsule (40 mg total) by mouth daily before breakfast. 08/28/19 05/18/20  Mansouraty, Netty Starring., MD  famotidine (PEPCID) 20 MG tablet Take 1 tablet (20 mg total) by mouth 2 (two) times daily. 10/24/19 05/18/20  Renne Crigler, PA-C  fluticasone (FLONASE) 50 MCG/ACT nasal spray Place 2 sprays into both nostrils daily. Patient not taking: Reported on 01/27/2019 06/05/18 05/01/19  Palumbo, April, MD  medroxyPROGESTERone (PROVERA) 10 MG tablet Take 1 tablet (10 mg total) by mouth daily for 7 days. 03/04/19 05/01/19  Sharlene Dory, DO  SUMAtriptan (IMITREX) 100 MG tablet Take 1 tablet (100 mg total) by mouth every 2 (two) hours as needed for migraine. May repeat in 2 hours if headache persists or recurs. Patient not taking: Reported on 03/27/2019 03/04/19 05/01/19  Sharlene Dory, DO      Allergies    Peach [prunus persica] and Miconazole    Review of Systems   Review of Systems  Constitutional:  Negative for chills and fever.  Gastrointestinal:  Negative for abdominal pain, constipation, diarrhea, nausea and vomiting.  Genitourinary:  Positive for dysuria and vaginal discharge. Negative for frequency, hematuria, pelvic pain, urgency, vaginal bleeding and vaginal pain.  Skin:  Negative for rash.    Physical Exam Updated Vital Signs BP 124/85   Pulse (!) 101   Temp 98.5 F (36.9 C) (Oral)   Resp 18   Ht 5\' 1"  (1.549 m)   Wt 89.4 kg   LMP 08/25/2022 (Exact Date)   SpO2 100%   BMI 37.22 kg/m  Physical Exam Vitals and nursing note reviewed. Exam conducted with a chaperone present Lowella Bandy, tech).  Constitutional:      General: She is not in acute distress.    Appearance: She is not ill-appearing or toxic-appearing.  HENT:     Mouth/Throat:     Mouth: Mucous membranes are moist.  Pulmonary:     Effort: Pulmonary effort is normal. No respiratory distress.  Abdominal:     Palpations: Abdomen is soft.     Tenderness:  There is no abdominal tenderness. There is no guarding or rebound.  Genitourinary:    Exam position: Lithotomy position.     Labia:        Right: No rash, tenderness or lesion.        Left: No rash, tenderness or lesion.      Cervix: No cervical motion tenderness.     Comments: Thick, white vaginal discharge present in the vaginal canal.  Malodorous.  No lesions seen.  Pink vaginal tissue present.  No CMT tenderness. Skin:    General: Skin is dry.  Neurological:     General: No focal deficit present.     Mental Status: She is alert.     ED Results / Procedures / Treatments   Labs (all  labs ordered are listed, but only abnormal results are displayed) Labs Reviewed  WET PREP, GENITAL - Abnormal; Notable for the following components:      Result Value   Clue Cells Wet Prep HPF POC PRESENT (*)    WBC, Wet Prep HPF POC >=10 (*)    All other components within normal limits  URINALYSIS, ROUTINE W REFLEX MICROSCOPIC - Abnormal; Notable for the following components:   APPearance HAZY (*)    Protein, ur 30 (*)    Leukocytes,Ua TRACE (*)    All other components within normal limits  URINALYSIS, MICROSCOPIC (REFLEX) - Abnormal; Notable for the following components:   Bacteria, UA MANY (*)    All other components within normal limits  URINE CULTURE  PREGNANCY, URINE  GC/CHLAMYDIA PROBE AMP ([place]) NOT AT [facility]    EKG None  Radiology No results found.  Procedures Procedures   Medications Ordered in ED Medications - No data to display  ED Course/ Medical Decision Making/ A&P   {  Medical Decision Making Amount and/or Complexity of Data Reviewed Labs: ordered.  Risk Prescription drug management.   27 y.o. female presents to the ER for evaluation of vaginal discharge. Differential diagnosis includes but is not limited to normal vaginal flora, bacterial vaginosis, STD, PID. Vital signs mild tachycardia at 101 otherwise unremarkable. Physical exam as noted above.   I  independently reviewed and interpreted the patient's labs.  Urinalysis shows hazy urine with 30 protein.  There is trace amount leukocytes and many bacteria but 0-5 white blood cells seen.  Squamous epithelials present as well.  Dirty catch however will order urine culture abrasion symptoms.  Pregnancy test is negative.  Wet prep shows clue cells present as well as greater than 10 white blood cells.  GC pending at this time.  The patient does not have any cervical motional tenderness, I doubt any PID.  She does not have any abdominal pain or pelvic pain.  She is complaining about increased vaginal discharge which is likely bacterial vaginosis given the clue cells as well as malodor.  She reports she is concerned for STDs so we did run a gonorrhea chlamydia.  Discussed with her that she will need to follow-up with [place] in the next few days for results.  Will prescribe her some Flagyl and diflucan. Educated on alcohol cessation during the medication.  Encouraged her to still follow-up with gynecology and urology at her scheduled appointments..  We discussed the results of the labs/imaging. The plan is to take Flagyl, follow-up with gynecology/urology. We discussed strict return precautions and red flag symptoms. The patient verbalized their understanding and agrees to the plan. The patient is stable and being discharged home in good condition.  Portions of this report may have been transcribed using voice recognition software. Every effort was made to ensure accuracy; however, inadvertent computerized transcription errors may be present.   Final Clinical Impression(s) / ED Diagnoses Final diagnoses:  Bacterial vaginosis    Rx / DC Orders ED Discharge Orders          Ordered    metroNIDAZOLE (FLAGYL) 500 MG tablet  2 times daily        09/22/22 1135              Achille Rich, PA-C 09/22/22 1145    Achille Rich, PA-C 09/22/22 1158    Achille Rich, PA-C 09/22/22 1348    Tanda Rockers A, DO 09/22/22 1349

## 2022-09-22 NOTE — ED Triage Notes (Signed)
Vaginal discharge that is light green to while. She stated the discharge is foul smelling.

## 2022-09-22 NOTE — Discharge Instructions (Addendum)
You were seen in the ER today for evaluation of your vaginal discharge.  It appears that you have bacterial vaginosis.  I recommend keeping your appointment with your gynecologist as well as a urologist for her symptoms.  If you have any worsening pain, worsening discharge, fever, blood in your urine, or abdominal pain, please return to the nearest emergency department for evaluation.  If you have any concerns, new or worsening symptoms, please return to the nearest emergency department for evaluation.  Contact a doctor if: Your symptoms do not get better, even after you are treated. You have more discharge or pain when you pee. You have a fever or chills. You have pain in your belly (abdomen) or in the area between your hips. You have pain with sex. You bleed from your vagina between menstrual periods.

## 2022-09-23 LAB — URINE CULTURE: Culture: <10000 — AB

## 2022-09-24 LAB — GC/CHLAMYDIA PROBE AMP (~~LOC~~) NOT AT ARMC
Chlamydia: NEGATIVE
Comment: NEGATIVE
Comment: NORMAL
Neisseria Gonorrhea: NEGATIVE

## 2022-10-04 DIAGNOSIS — J45909 Unspecified asthma, uncomplicated: Secondary | ICD-10-CM | POA: Diagnosis not present

## 2022-10-04 DIAGNOSIS — G43809 Other migraine, not intractable, without status migrainosus: Secondary | ICD-10-CM | POA: Insufficient documentation

## 2022-10-04 DIAGNOSIS — R519 Headache, unspecified: Secondary | ICD-10-CM | POA: Diagnosis present

## 2022-10-04 DIAGNOSIS — G2571 Drug induced akathisia: Secondary | ICD-10-CM | POA: Insufficient documentation

## 2022-10-04 NOTE — ED Triage Notes (Addendum)
Complains of facial pressure, left sided CP, blurry vision, sensitivity to light. Left flank pain. Both starting about 1 hr PTA. No weakness. Neuro exam shows no focal deficits.

## 2022-10-05 ENCOUNTER — Emergency Department (HOSPITAL_BASED_OUTPATIENT_CLINIC_OR_DEPARTMENT_OTHER): Payer: Medicaid Other | Admitting: Radiology

## 2022-10-05 ENCOUNTER — Encounter (HOSPITAL_BASED_OUTPATIENT_CLINIC_OR_DEPARTMENT_OTHER): Payer: Self-pay

## 2022-10-05 ENCOUNTER — Emergency Department (HOSPITAL_BASED_OUTPATIENT_CLINIC_OR_DEPARTMENT_OTHER)
Admission: EM | Admit: 2022-10-05 | Discharge: 2022-10-05 | Disposition: A | Discharge status: Home / Self Care | Payer: Medicaid Other | Attending: Student | Admitting: Student

## 2022-10-05 ENCOUNTER — Other Ambulatory Visit: Payer: Self-pay

## 2022-10-05 DIAGNOSIS — G2571 Drug induced akathisia: Secondary | ICD-10-CM

## 2022-10-05 DIAGNOSIS — G43809 Other migraine, not intractable, without status migrainosus: Secondary | ICD-10-CM

## 2022-10-05 LAB — CBC
HCT: 34.6 % — ABNORMAL LOW (ref 36.0–46.0)
Hemoglobin: 11 g/dL — ABNORMAL LOW (ref 12.0–15.0)
MCH: 27.1 pg (ref 26.0–34.0)
MCHC: 31.8 g/dL (ref 30.0–36.0)
MCV: 85.2 fL (ref 80.0–100.0)
Platelets: 352 10*3/uL (ref 150–400)
RBC: 4.06 MIL/uL (ref 3.87–5.11)
RDW: 14.5 % (ref 11.5–15.5)
WBC: 8 10*3/uL (ref 4.0–10.5)
nRBC: 0 % (ref 0.0–0.2)

## 2022-10-05 LAB — BASIC METABOLIC PANEL
Anion gap: 9 (ref 5–15)
BUN: 8 mg/dL (ref 6–20)
CO2: 24 mmol/L (ref 22–32)
Calcium: 8.9 mg/dL (ref 8.9–10.3)
Chloride: 106 mmol/L (ref 98–111)
Creatinine, Ser: 0.72 mg/dL (ref 0.44–1.00)
GFR, Estimated: >60 mL/min (ref >60)
Glucose, Bld: 87 mg/dL (ref 70–99)
Potassium: 4 mmol/L (ref 3.5–5.1)
Sodium: 139 mmol/L (ref 135–145)

## 2022-10-05 LAB — TROPONIN I (HIGH SENSITIVITY): Troponin I (High Sensitivity): <2 ng/L (ref <18)

## 2022-10-05 MED ORDER — LORAZEPAM 2 MG/ML IJ SOLN
1.0000 mg | Freq: Once | INTRAMUSCULAR | Status: AC
  Administered 2022-10-05: 1 mg via INTRAVENOUS

## 2022-10-05 MED ORDER — LORAZEPAM 2 MG/ML IJ SOLN
0.5000 mg | Freq: Once | INTRAMUSCULAR | Status: AC
  Administered 2022-10-05: 0.5 mg via INTRAVENOUS

## 2022-10-05 MED ORDER — DIPHENHYDRAMINE HCL 50 MG/ML IJ SOLN
25.0000 mg | Freq: Once | INTRAMUSCULAR | Status: AC
  Administered 2022-10-05: 25 mg via INTRAVENOUS

## 2022-10-05 MED ORDER — DIAZEPAM 5 MG/ML IJ SOLN
INTRAMUSCULAR | Status: AC
  Filled 2022-10-05: qty 2

## 2022-10-05 MED ORDER — LACTATED RINGERS IV BOLUS
1000.0000 mL | Freq: Once | INTRAVENOUS | Status: AC
  Administered 2022-10-05: 1000 mL via INTRAVENOUS

## 2022-10-05 MED ORDER — PROCHLORPERAZINE EDISYLATE 10 MG/2ML IJ SOLN
10.0000 mg | Freq: Once | INTRAMUSCULAR | Status: AC
  Administered 2022-10-05: 5 mg via INTRAVENOUS

## 2022-10-05 MED ORDER — PROCHLORPERAZINE EDISYLATE 10 MG/2ML IJ SOLN
INTRAMUSCULAR | Status: AC
  Filled 2022-10-05: qty 2

## 2022-10-05 MED ORDER — LORAZEPAM 2 MG/ML IJ SOLN
INTRAMUSCULAR | Status: AC
  Filled 2022-10-05: qty 1

## 2022-10-05 MED ORDER — DIAZEPAM 5 MG/ML IJ SOLN: 5.0000 mg | Freq: Once | INTRAMUSCULAR | Status: DC

## 2022-10-05 MED ORDER — DIPHENHYDRAMINE HCL 50 MG/ML IJ SOLN
INTRAMUSCULAR | Status: AC
  Filled 2022-10-05: qty 1

## 2022-10-05 NOTE — ED Notes (Signed)
Pt ambulatory to restroom with steady gait.

## 2022-10-05 NOTE — ED Notes (Signed)
Patient resting quietly in stretcher, respirations even, unlabored, no acute distress noted. Denies needs at this time.  

## 2022-10-05 NOTE — ED Notes (Signed)
Pt ambulatory to restroom again, states she would like to go home, MD notified and at pt bedside.

## 2022-10-05 NOTE — ED Notes (Signed)
Pt became tachycardic, anxious, MD Kommor notified, verbal order for 0.25 mg ativan IV push, pt would like to hold administration, MD states OK.

## 2022-10-05 NOTE — ED Provider Notes (Signed)
[place] EMERGENCY DEPARTMENT AT [facility] Provider Note  CSN: [id number] Arrival date & time: 10/04/22 2346  Chief Complaint(s) Dizziness  HPI Brianna Byrd is a 27 y.o. female with PMH migraines who presents emergency department for evaluation of headache and chest pain.  Patient states that symptoms began abruptly at approximately 11 PM on 10/04/2022 with a pressure-like sensation in her head and some intermittent chest pain.  No associated nausea or vomiting.  Denies numbness, tingling, weakness or other neurologic complaints.   Past Medical History Past Medical History:  Diagnosis Date   Asthma    Obesity    Reflux    Patient Active Problem List   Diagnosis Date Noted   Witnessed apneic spells 08/30/2020   Chronic tension-type headache 07/28/2020   Daytime somnolence 07/28/2020   Obesity 07/28/2020   Vitamin B12 deficiency 07/28/2020   Neck pain on left side 12/21/2019   Musculoskeletal pain 12/21/2019   Abdominal bloating 07/30/2019   Heartburn 07/30/2019   Migraine without aura and without status migrainosus, not intractable 03/04/2019   Left breast mass 01/27/2019   Amenorrhea 08/26/2017   Goiter diffuse 08/26/2017   History of pre-eclampsia 02/12/2017   SVD (spontaneous vaginal delivery) 12/26/2016   Psychophysiologic insomnia 12/21/2016   Trichomonal vaginitis during pregnancy, antepartum 06/05/2016   Pregnant 05/14/2016   Constitutional short stature 05/14/2016   Home Medication(s) Prior to Admission medications   Medication Sig Start Date End Date Taking? Authorizing Provider  celecoxib (CELEBREX) 100 MG capsule Take 1 capsule (100 mg total) by mouth 2 (two) times daily. 12/27/20   Dione Booze, MD  cephALEXin (KEFLEX) 500 MG capsule Take 1 capsule (500 mg total) by mouth 4 (four) times daily. 11/19/21   Geoffery Lyons, MD  Cyanocobalamin (VITAMIN B-12) 5000 MCG SUBL Place 1 tablet (5,000 mcg total) under the tongue daily. 07/28/20 04/17/21   Delano Metz, MD  cyclobenzaprine (FLEXERIL) 10 MG tablet Take 1 tablet (10 mg total) by mouth 3 (three) times daily as needed for muscle spasms. Patient not taking: Reported on 05/18/2021 12/27/20   Dione Booze, MD  diclofenac (VOLTAREN) 75 MG EC tablet Take 1 tablet (75 mg total) by mouth 2 (two) times daily. Patient not taking: Reported on 05/18/2021 04/24/21   Elson Areas, PA-C  erythromycin ophthalmic ointment Place a 1/2 inch ribbon of ointment into the lower eyelid. 03/12/21   Marita Kansas, PA-C  fluconazole (DIFLUCAN) 150 MG tablet Take 1 tablet (150 mg total) by mouth daily. 09/22/22   Achille Rich, PA-C  meclizine (ANTIVERT) 25 MG tablet Take 1 tablet (25 mg total) by mouth 3 (three) times daily as needed for dizziness. Patient not taking: Reported on 05/18/2021 09/07/20   Terrilee Files, MD  metroNIDAZOLE (FLAGYL) 500 MG tablet Take 1 tablet (500 mg total) by mouth 2 (two) times daily. 09/22/22   Achille Rich, PA-C  naproxen (NAPROSYN) 500 MG tablet Take 1 tablet (500 mg total) by mouth 2 (two) times daily. 01/25/22   Jeanelle Malling, PA  ondansetron (ZOFRAN) 4 MG tablet Take 1 tablet (4 mg total) by mouth 3 (three) times daily as needed for nausea or vomiting. Take before meals Patient not taking: Reported on 05/18/2021 04/17/21   Arnaldo Natal, NP  pantoprazole (PROTONIX) 40 MG tablet Take 1 tablet (40 mg total) by mouth 2 (two) times daily before a meal. Patient not taking: Reported on 05/18/2021 04/17/21   Arnaldo Natal, NP  rizatriptan (MAXALT-MLT) 10 MG disintegrating tablet Take 1 tablet earliest onset  of migraine.  May repeat in 2 hours if needed.  Maximum 2 tablets in 24 hours. Patient not taking: Reported on 05/18/2021 02/13/21   Drema Dallas, DO  simethicone (GAS-X) 80 MG chewable tablet Chew 1 tablet (80 mg total) by mouth every 6 (six) hours as needed for flatulence. 12/27/20   Dione Booze, MD  sucralfate (CARAFATE) 1 g tablet Take 1 tablet (1 g total) by mouth 4  (four) times daily -  with meals and at bedtime. 06/24/22   Geoffery Lyons, MD  topiramate (TOPAMAX) 50 MG tablet Take 1/2 tablet at bedtime for one week, then increase to 1 tablet at bedtime 02/13/21   Drema Dallas, DO  valACYclovir (VALTREX) 1000 MG tablet Take 1 tablet (1,000 mg total) by mouth 3 (three) times daily. 11/19/21   Geoffery Lyons, MD  esomeprazole (NEXIUM) 40 MG capsule Take 1 capsule (40 mg total) by mouth daily before breakfast. 08/28/19 05/18/20  Mansouraty, Netty Starring., MD  famotidine (PEPCID) 20 MG tablet Take 1 tablet (20 mg total) by mouth 2 (two) times daily. 10/24/19 05/18/20  Renne Crigler, PA-C  fluticasone (FLONASE) 50 MCG/ACT nasal spray Place 2 sprays into both nostrils daily. Patient not taking: Reported on 01/27/2019 06/05/18 05/01/19  Palumbo, April, MD  medroxyPROGESTERone (PROVERA) 10 MG tablet Take 1 tablet (10 mg total) by mouth daily for 7 days. 03/04/19 05/01/19  Sharlene Dory, DO  SUMAtriptan (IMITREX) 100 MG tablet Take 1 tablet (100 mg total) by mouth every 2 (two) hours as needed for migraine. May repeat in 2 hours if headache persists or recurs. Patient not taking: Reported on 03/27/2019 03/04/19 05/01/19  Sharlene Dory, DO                                                                                                                                    Past Surgical History Past Surgical History:  Procedure Laterality Date   NO PAST SURGERIES     UPPER GASTROINTESTINAL ENDOSCOPY  08/28/2019   Family History Family History  Problem Relation Age of Onset   Hypertension Mother    Diabetes Father    Diabetes Sister    Rectal cancer Cousin    Colon cancer Maternal Aunt    Cancer Neg Hx    Colon polyps Neg Hx    Esophageal cancer Neg Hx    Stomach cancer Neg Hx     Social History Social History   Tobacco Use   Smoking status: Never   Smokeless tobacco: Never  Vaping Use   Vaping status: Never Used  Substance Use Topics   Alcohol use: No    Drug use: No   Allergies Peach [prunus persica], Compazine [prochlorperazine], and Miconazole  Review of Systems Review of Systems  Cardiovascular:  Positive for chest pain.  Neurological:  Positive for headaches.   Physical Exam Vital Signs  I have reviewed the triage vital signs BP 116/70  Pulse 76   Temp 98 F (36.7 C) (Oral)   Resp 18   Ht 5\' 1"  (1.549 m)   Wt 90.3 kg   LMP 08/25/2022 (Exact Date)   SpO2 100%   BMI 37.60 kg/m   Physical Exam Vitals and nursing note reviewed.  Constitutional:      General: She is not in acute distress.    Appearance: She is well-developed.  HENT:     Head: Normocephalic and atraumatic.  Eyes:     Conjunctiva/sclera: Conjunctivae normal.  Cardiovascular:     Rate and Rhythm: Normal rate and regular rhythm.     Heart sounds: No murmur heard. Pulmonary:     Effort: Pulmonary effort is normal. No respiratory distress.     Breath sounds: Normal breath sounds.  Abdominal:     Palpations: Abdomen is soft.     Tenderness: There is no abdominal tenderness.  Musculoskeletal:        General: No swelling.     Cervical back: Neck supple.  Skin:    General: Skin is warm and dry.     Capillary Refill: Capillary refill takes less than 2 seconds.  Neurological:     Mental Status: She is alert.  Psychiatric:        Mood and Affect: Mood normal.     ED Results and Treatments Labs (all labs ordered are listed, but only abnormal results are displayed) Labs Reviewed  CBC - Abnormal; Notable for the following components:      Result Value   Hemoglobin 11.0 (*)    HCT 34.6 (*)    All other components within normal limits  BASIC METABOLIC PANEL  TROPONIN I (HIGH SENSITIVITY)  TROPONIN I (HIGH SENSITIVITY)                                                                                                                          Radiology No results found.  Pertinent labs & imaging results that were available during my care of the  patient were reviewed by me and considered in my medical decision making (see MDM for details).  Medications Ordered in ED Medications  prochlorperazine (COMPAZINE) injection 10 mg (5 mg Intravenous Given 10/05/22 0130)  diphenhydrAMINE (BENADRYL) injection 25 mg (25 mg Intravenous Given 10/05/22 0130)  lactated ringers bolus 1,000 mL (0 mLs Intravenous Stopped 10/05/22 0342)  LORazepam (ATIVAN) injection 1 mg (1 mg Intravenous Given 10/05/22 0247)  LORazepam (ATIVAN) injection 0.5 mg (0.5 mg Intravenous Given 10/05/22 0135)  lactated ringers bolus 1,000 mL (0 mLs Intravenous Stopped 10/05/22 0557)  Procedures Procedures  (including critical care time)  Medical Decision Making / ED Course   This patient presents to the ED for concern of HA, CP, this involves an extensive number of treatment options, and is a complaint that carries with it a high risk of complications and morbidity.  The differential diagnosis includes Migraine, Cluster, Tension Ha, Dural venous thrombosis, Sinusitis, CO poisoning, HTN, Malignancy   MDM: Patient seen emergency room for evaluation of headache and chest discomfort.  Physical exam is unremarkable with no focal motor or sensory deficits.  Cardiopulmonary exam unremarkable.  Laboratory evaluation unremarkable and high-sensitivity troponin is negative.  ECG nonischemic.  Patient received a headache cocktail with Compazine Benadryl and fluids but unfortunately patient had fairly significant akathisia and her heart rates jump to sinus tachycardia with rates in the 170s.  Patient required multiple doses of Ativan with frequent reevaluations and ultimately akathisia resolved along with her positional tachycardia.  We discussed her medication reaction today and in the future she will request benzodiazepines with any antiemetics used and headache  cocktails and she will likely need to forego use of Benadryl in this scenario.  Unsure if she suffered akathisia or a paradoxical reaction to the Benadryl today.  Lower suspicion for arrhythmia today given that the symptoms occurred within 10 minutes of receiving the Compazine and she had significant anxiety associate with her symptoms which certainly fits with akathisia.  On final reevaluation, heart rates have improved, patient states her symptoms have completely resolved and at this time patient does not meet inpatient criteria for admission.  I have very low suspicion for ACS and her heart score is less than 3.  Patient then discharged with outpatient follow-up.  Return precautions given which she voiced understanding.  Additional history obtained: -Additional history obtained from mother -External records from outside source obtained and reviewed including: Chart review including previous notes, labs, imaging, consultation notes   Lab Tests: -I ordered, reviewed, and interpreted labs.   The pertinent results include:   Labs Reviewed  CBC - Abnormal; Notable for the following components:      Result Value   Hemoglobin 11.0 (*)    HCT 34.6 (*)    All other components within normal limits  BASIC METABOLIC PANEL  TROPONIN I (HIGH SENSITIVITY)  TROPONIN I (HIGH SENSITIVITY)     Medicines ordered and prescription drug management: Meds ordered this encounter  Medications   prochlorperazine (COMPAZINE) injection 10 mg   diphenhydrAMINE (BENADRYL) injection 25 mg   lactated ringers bolus 1,000 mL   DISCONTD: diazepam (VALIUM) injection 5 mg   LORazepam (ATIVAN) injection 1 mg   LORazepam (ATIVAN) injection 0.5 mg   lactated ringers bolus 1,000 mL    -I have reviewed the patients home medicines and have made adjustments as needed  Critical interventions none    Cardiac Monitoring: The patient was maintained on a cardiac monitor.  I personally viewed and interpreted the cardiac  monitored which showed an underlying rhythm of: NSR, sinus tachycardia  Social Determinants of Health:  Factors impacting patients care include: none   Reevaluation: After the interventions noted above, I reevaluated the patient and found that they have :improved  Co morbidities that complicate the patient evaluation  Past Medical History:  Diagnosis Date   Asthma    Obesity    Reflux       Dispostion: I considered admission for this patient, but at this time she does not meet inpatient criteria for admission and she is safe for  discharge with outpatient follow-up     Final Clinical Impression(s) / ED Diagnoses Final diagnoses:  Other migraine without status migrainosus, not intractable  Akathisia     @PCDICTATION @    Dahir Ayer, Wyn Forster, MD 10/05/22 [phone]

## 2022-10-05 NOTE — ED Notes (Signed)
Pt ambulatory to restroom with steady gait, reports feeling much improved.

## 2022-10-05 NOTE — ED Notes (Signed)
Reviewed AVS with patient, patient expressed understanding of directions, denies further questions at this time. 

## 2022-10-05 NOTE — Discharge Instructions (Signed)
You were seen in the emergency room for evaluation of a headache.  Your workup was reassuringly normal and we were able to control your headache symptoms fairly easily with a headache cocktail.  This combination of drugs included Compazine (prochlorperazine) and Benadryl (diphenhydramine) and unfortunately you had a side effect called akathisia.  This is a sensation of anxiety that can make you feel fairly uncomfortable.  In the future, if you do have headache cocktail is used for migraine headaches, please ask for drugs like Ativan to be used instead of Benadryl.  At this time you are safe for discharge but please return the emergency department if symptoms return or worsen.

## 2022-10-05 NOTE — ED Notes (Signed)
Pt complained compazine was burning after 5 mg administered, patient requested to stop administration.

## 2022-10-16 ENCOUNTER — Encounter (HOSPITAL_BASED_OUTPATIENT_CLINIC_OR_DEPARTMENT_OTHER): Payer: Self-pay | Admitting: Emergency Medicine

## 2022-10-16 ENCOUNTER — Emergency Department (HOSPITAL_BASED_OUTPATIENT_CLINIC_OR_DEPARTMENT_OTHER)
Admission: EM | Admit: 2022-10-16 | Discharge: 2022-10-16 | Disposition: A | Discharge status: Home / Self Care | Payer: Medicaid Other

## 2022-10-16 DIAGNOSIS — N760 Acute vaginitis: Secondary | ICD-10-CM

## 2022-10-16 DIAGNOSIS — R300 Dysuria: Secondary | ICD-10-CM | POA: Diagnosis not present

## 2022-10-16 DIAGNOSIS — N898 Other specified noninflammatory disorders of vagina: Secondary | ICD-10-CM | POA: Diagnosis present

## 2022-10-16 LAB — URINALYSIS, ROUTINE W REFLEX MICROSCOPIC
Bilirubin Urine: NEGATIVE
Glucose, UA: NEGATIVE mg/dL
Hgb urine dipstick: NEGATIVE
Ketones, ur: NEGATIVE mg/dL
Nitrite: NEGATIVE
Protein, ur: 30 mg/dL — AB
Specific Gravity, Urine: >=1.03 (ref 1.005–1.030)
pH: 6 (ref 5.0–8.0)

## 2022-10-16 LAB — WET PREP, GENITAL
Sperm: NONE SEEN
Trich, Wet Prep: NONE SEEN
WBC, Wet Prep HPF POC: >=10 — AB (ref <10)

## 2022-10-16 LAB — URINALYSIS, MICROSCOPIC (REFLEX)

## 2022-10-16 LAB — PREGNANCY, URINE: Preg Test, Ur: NEGATIVE

## 2022-10-16 MED ORDER — METRONIDAZOLE 500 MG PO TABS: 500.0000 mg | ORAL_TABLET | Freq: Two times a day (BID) | ORAL | 0 refills | Status: AC

## 2022-10-16 NOTE — ED Provider Notes (Signed)
[place] EMERGENCY DEPARTMENT AT [facility] [place] Provider Note   CSN: [id number] Arrival date & time: 10/16/22  1036     History  No chief complaint on file.   Rashmi Tarleton is a 27 y.o. female.  27 year old female presenting emergency department chief complaint of white spots in vagina.  She has had episodic dysuria for the past month and a half or so going untreated with antibiotics and antifungals by ED and her OB/GYN that she saw last month.  She reports that symptoms started improved, then worsened again.  She is complaining of weight colored vaginal discharge and noticed that she had some spots today.  She is concerned more so for the spots.  They are not painful.  She is still sexually active with same partner.  Denies any fevers, chills, chest pain, shortness of breath, abdominal pain no nausea vomiting diarrhea.        Home Medications Prior to Admission medications   Medication Sig Start Date End Date Taking? Authorizing Provider  celecoxib (CELEBREX) 100 MG capsule Take 1 capsule (100 mg total) by mouth 2 (two) times daily. 12/27/20   Dione Booze, MD  cephALEXin (KEFLEX) 500 MG capsule Take 1 capsule (500 mg total) by mouth 4 (four) times daily. 11/19/21   Geoffery Lyons, MD  Cyanocobalamin (VITAMIN B-12) 5000 MCG SUBL Place 1 tablet (5,000 mcg total) under the tongue daily. 07/28/20 04/17/21  Delano Metz, MD  cyclobenzaprine (FLEXERIL) 10 MG tablet Take 1 tablet (10 mg total) by mouth 3 (three) times daily as needed for muscle spasms. Patient not taking: Reported on 05/18/2021 12/27/20   Dione Booze, MD  diclofenac (VOLTAREN) 75 MG EC tablet Take 1 tablet (75 mg total) by mouth 2 (two) times daily. Patient not taking: Reported on 05/18/2021 04/24/21   Elson Areas, PA-C  erythromycin ophthalmic ointment Place a 1/2 inch ribbon of ointment into the lower eyelid. 03/12/21   Marita Kansas, PA-C  fluconazole (DIFLUCAN) 150 MG tablet Take 1 tablet (150 mg total) by  mouth daily. 09/22/22   Achille Rich, PA-C  meclizine (ANTIVERT) 25 MG tablet Take 1 tablet (25 mg total) by mouth 3 (three) times daily as needed for dizziness. Patient not taking: Reported on 05/18/2021 09/07/20   Terrilee Files, MD  metroNIDAZOLE (FLAGYL) 500 MG tablet Take 1 tablet (500 mg total) by mouth 2 (two) times daily. 09/22/22   Achille Rich, PA-C  naproxen (NAPROSYN) 500 MG tablet Take 1 tablet (500 mg total) by mouth 2 (two) times daily. 01/25/22   Jeanelle Malling, PA  ondansetron (ZOFRAN) 4 MG tablet Take 1 tablet (4 mg total) by mouth 3 (three) times daily as needed for nausea or vomiting. Take before meals Patient not taking: Reported on 05/18/2021 04/17/21   Arnaldo Natal, NP  pantoprazole (PROTONIX) 40 MG tablet Take 1 tablet (40 mg total) by mouth 2 (two) times daily before a meal. Patient not taking: Reported on 05/18/2021 04/17/21   Arnaldo Natal, NP  rizatriptan (MAXALT-MLT) 10 MG disintegrating tablet Take 1 tablet earliest onset of migraine.  May repeat in 2 hours if needed.  Maximum 2 tablets in 24 hours. Patient not taking: Reported on 05/18/2021 02/13/21   Drema Dallas, DO  simethicone (GAS-X) 80 MG chewable tablet Chew 1 tablet (80 mg total) by mouth every 6 (six) hours as needed for flatulence. 12/27/20   Dione Booze, MD  sucralfate (CARAFATE) 1 g tablet Take 1 tablet (1 g total) by mouth 4 (four)  times daily -  with meals and at bedtime. 06/24/22   Geoffery Lyons, MD  topiramate (TOPAMAX) 50 MG tablet Take 1/2 tablet at bedtime for one week, then increase to 1 tablet at bedtime 02/13/21   Drema Dallas, DO  valACYclovir (VALTREX) 1000 MG tablet Take 1 tablet (1,000 mg total) by mouth 3 (three) times daily. 11/19/21   Geoffery Lyons, MD  esomeprazole (NEXIUM) 40 MG capsule Take 1 capsule (40 mg total) by mouth daily before breakfast. 08/28/19 05/18/20  Mansouraty, Netty Starring., MD  famotidine (PEPCID) 20 MG tablet Take 1 tablet (20 mg total) by mouth 2 (two) times daily.  10/24/19 05/18/20  Renne Crigler, PA-C  fluticasone (FLONASE) 50 MCG/ACT nasal spray Place 2 sprays into both nostrils daily. Patient not taking: Reported on 01/27/2019 06/05/18 05/01/19  Palumbo, April, MD  medroxyPROGESTERone (PROVERA) 10 MG tablet Take 1 tablet (10 mg total) by mouth daily for 7 days. 03/04/19 05/01/19  Sharlene Dory, DO  SUMAtriptan (IMITREX) 100 MG tablet Take 1 tablet (100 mg total) by mouth every 2 (two) hours as needed for migraine. May repeat in 2 hours if headache persists or recurs. Patient not taking: Reported on 03/27/2019 03/04/19 05/01/19  Sharlene Dory, DO      Allergies    Peach [prunus persica], Compazine [prochlorperazine], and Miconazole    Review of Systems   Review of Systems  Physical Exam Updated Vital Signs BP (!) 116/91 (BP Location: Right Arm)   Pulse 90   Temp 98 F (36.7 C) (Oral)   Resp 18   Ht 5\' 1"  (1.549 m)   Wt 89.4 kg   LMP 09/24/2022 (Exact Date)   SpO2 100%   BMI 37.22 kg/m  Physical Exam Vitals and nursing note reviewed. Exam conducted with a chaperone present.  Constitutional:      Appearance: Normal appearance.  HENT:     Head: Normocephalic.     Nose: Nose normal.     Mouth/Throat:     Mouth: Mucous membranes are moist.  Eyes:     Conjunctiva/sclera: Conjunctivae normal.  Cardiovascular:     Rate and Rhythm: Normal rate and regular rhythm.  Pulmonary:     Effort: Pulmonary effort is normal.  Abdominal:     General: Abdomen is flat. There is no distension.     Tenderness: There is no abdominal tenderness. There is no guarding or rebound.  Genitourinary:    General: Normal vulva.     Vagina: Vaginal discharge present.     Comments: External exam performed with chaperone present.  Patient is white discharge at the eyes.  She has some white-colored small white patches with similar appears as aphthous ulcers.  Neurological:     Mental Status: She is alert.     ED Results / Procedures / Treatments    Labs (all labs ordered are listed, but only abnormal results are displayed) Labs Reviewed  WET PREP, GENITAL - Abnormal; Notable for the following components:      Result Value   Clue Cells Wet Prep HPF POC PRESENT (*)    WBC, Wet Prep HPF POC >=10 (*)    All other components within normal limits  URINALYSIS, ROUTINE W REFLEX MICROSCOPIC - Abnormal; Notable for the following components:   Protein, ur 30 (*)    Leukocytes,Ua TRACE (*)    All other components within normal limits  URINALYSIS, MICROSCOPIC (REFLEX) - Abnormal; Notable for the following components:   Bacteria, UA FEW (*)  All other components within normal limits  PREGNANCY, URINE  GC/CHLAMYDIA PROBE AMP ([place]) NOT AT [facility]    EKG None  Radiology No results found.  Procedures Procedures    Medications Ordered in ED Medications - No data to display  ED Course/ Medical Decision Making/ A&P Clinical Course as of 10/16/22 1125  Tue Oct 16, 2022  1052 Negative GC chlamydia on 7/6 [TY]  1052 Per chart review appears that patient was treated for BV/yeast with Diflucan and metronidazole 7/6 [TY]  1113 Clue Cells Wet Prep HPF POC(!): PRESENT [TY]  1113 Preg Test, Ur: NEGATIVE [TY]  1113 Urinalysis, Routine w reflex microscopic -Urine, Clean Catch(!) Not consistent with UTI [TY]  1122 Discussed results with patient.  Offered empiric antibiotics for STI, she declined would like to wait.  Suspect symptoms secondary to BV.  She does have whitish lesions that she is noted today, they are not painful, not overly consistent with herpes or syphilis.  Offered patient empiric treatment, patient stated she would like to follow-up with her OB/GYN and would only like to be treated for her BV at this time.  Patient stable for discharge.  Return precaution given. [TY]    Clinical Course User Index [TY] Coral Spikes, DO                                 Medical Decision Making Well-appearing 27 year old female present  emergency department for dysuria, vaginal discharge.  She is afebrile nontachycardic normotensive.  Physical exam reassuring with soft benign abdomen.  Some white discharge on external pelvic exam.  She does have history of gonorrhea per chart review.  Will get GC chlamydia.  Discussed empiric treatment, patient will think about it.  See ED course for final decision.  Will also get UA to evaluate for UTI.  Pregnancy.  Does have history of BV.  Will get wet prep.  See ED course for final MDM and disposition.  Amount and/or Complexity of Data Reviewed Labs: ordered.    Details: Consider blood work, however patient with stable vitals and reassuring exam and similarly isolated GU complaint.  Labs unlikely to change management disposition.           Final Clinical Impression(s) / ED Diagnoses Final diagnoses:  None    Rx / DC Orders ED Discharge Orders     None         Coral Spikes, DO 10/16/22 1125

## 2022-10-16 NOTE — ED Triage Notes (Signed)
Pt here from home with c/o white spots inside of her vagina , finished up antibiotics that her OB gave her also have having burning upon urination

## 2022-10-16 NOTE — Discharge Instructions (Addendum)
You was seen in the emergency department for your vaginal discharge.  It appears you have bacterial vaginosis.  We are prescribing you antibiotic.  Please follow-up on MyChart in the next 24 to 48 hours for your gonorrhea chlamydia results.  As discussed, please follow-up with your OB/GYN as soon as possible.  You may also follow-up with the health department for further STI testing and treatments.  Return immediately develop any fevers, chills, abdominal pain, sudden onset vaginal bleeding, or any new or worsening symptoms that are concerning to you.

## 2022-10-18 ENCOUNTER — Encounter (HOSPITAL_BASED_OUTPATIENT_CLINIC_OR_DEPARTMENT_OTHER): Payer: Self-pay | Admitting: Emergency Medicine

## 2022-10-18 ENCOUNTER — Other Ambulatory Visit: Payer: Self-pay

## 2022-10-18 ENCOUNTER — Observation Stay (HOSPITAL_BASED_OUTPATIENT_CLINIC_OR_DEPARTMENT_OTHER)
Admission: EM | Admit: 2022-10-18 | Discharge: 2022-10-19 | Disposition: A | Discharge status: Home / Self Care | Payer: Medicaid Other | Attending: Internal Medicine | Admitting: Internal Medicine

## 2022-10-18 DIAGNOSIS — Z79899 Other long term (current) drug therapy: Secondary | ICD-10-CM | POA: Insufficient documentation

## 2022-10-18 DIAGNOSIS — J45909 Unspecified asthma, uncomplicated: Secondary | ICD-10-CM | POA: Insufficient documentation

## 2022-10-18 DIAGNOSIS — E876 Hypokalemia: Secondary | ICD-10-CM | POA: Diagnosis not present

## 2022-10-18 DIAGNOSIS — R000 Tachycardia, unspecified: Principal | ICD-10-CM | POA: Insufficient documentation

## 2022-10-18 DIAGNOSIS — Z1152 Encounter for screening for COVID-19: Secondary | ICD-10-CM | POA: Diagnosis not present

## 2022-10-18 DIAGNOSIS — E669 Obesity, unspecified: Secondary | ICD-10-CM | POA: Insufficient documentation

## 2022-10-18 DIAGNOSIS — E6609 Other obesity due to excess calories: Secondary | ICD-10-CM

## 2022-10-18 DIAGNOSIS — D649 Anemia, unspecified: Secondary | ICD-10-CM | POA: Diagnosis not present

## 2022-10-18 DIAGNOSIS — K900 Celiac disease: Secondary | ICD-10-CM | POA: Diagnosis present

## 2022-10-18 LAB — FOLATE: Folate: 10.6 ng/mL (ref >5.9)

## 2022-10-18 LAB — BASIC METABOLIC PANEL
Anion gap: 9 (ref 5–15)
BUN: <5 mg/dL — ABNORMAL LOW (ref 6–20)
CO2: 23 mmol/L (ref 22–32)
Calcium: 8.9 mg/dL (ref 8.9–10.3)
Chloride: 104 mmol/L (ref 98–111)
Creatinine, Ser: 0.74 mg/dL (ref 0.44–1.00)
GFR, Estimated: >60 mL/min (ref >60)
Glucose, Bld: 114 mg/dL — ABNORMAL HIGH (ref 70–99)
Potassium: 2.6 mmol/L — CL (ref 3.5–5.1)
Sodium: 136 mmol/L (ref 135–145)

## 2022-10-18 LAB — CBC WITH DIFFERENTIAL/PLATELET
Abs Immature Granulocytes: 0.05 10*3/uL (ref 0.00–0.07)
Abs Immature Granulocytes: 0.02 10*3/uL (ref 0.00–0.07)
Basophils Absolute: 0 10*3/uL (ref 0.0–0.1)
Basophils Absolute: 0 10*3/uL (ref 0.0–0.1)
Basophils Relative: 0 %
Basophils Relative: 0 %
Eosinophils Absolute: 0 10*3/uL (ref 0.0–0.5)
Eosinophils Absolute: 0.1 10*3/uL (ref 0.0–0.5)
Eosinophils Relative: 0 %
Eosinophils Relative: 1 %
HCT: 34.4 % — ABNORMAL LOW (ref 36.0–46.0)
HCT: 34.4 % — ABNORMAL LOW (ref 36.0–46.0)
Hemoglobin: 11.1 g/dL — ABNORMAL LOW (ref 12.0–15.0)
Hemoglobin: 10.2 g/dL — ABNORMAL LOW (ref 12.0–15.0)
Immature Granulocytes: 0 %
Immature Granulocytes: 0 %
Lymphocytes Relative: 18 %
Lymphocytes Relative: 22 %
Lymphs Abs: 2.1 10*3/uL (ref 0.7–4.0)
Lymphs Abs: 1.9 10*3/uL (ref 0.7–4.0)
MCH: 26.7 pg (ref 26.0–34.0)
MCH: 26.7 pg (ref 26.0–34.0)
MCHC: 32.3 g/dL (ref 30.0–36.0)
MCHC: 29.7 g/dL — ABNORMAL LOW (ref 30.0–36.0)
MCV: 82.7 fL (ref 80.0–100.0)
MCV: 90.1 fL (ref 80.0–100.0)
Monocytes Absolute: 0.9 10*3/uL (ref 0.1–1.0)
Monocytes Absolute: 0.6 10*3/uL (ref 0.1–1.0)
Monocytes Relative: 8 %
Monocytes Relative: 7 %
Neutro Abs: 8.9 10*3/uL — ABNORMAL HIGH (ref 1.7–7.7)
Neutro Abs: 6.2 10*3/uL (ref 1.7–7.7)
Neutrophils Relative %: 74 %
Neutrophils Relative %: 70 %
Platelets: 322 10*3/uL (ref 150–400)
Platelets: 256 10*3/uL (ref 150–400)
RBC: 4.16 MIL/uL (ref 3.87–5.11)
RBC: 3.82 MIL/uL — ABNORMAL LOW (ref 3.87–5.11)
RDW: 14.6 % (ref 11.5–15.5)
RDW: 14.8 % (ref 11.5–15.5)
WBC: 12 10*3/uL — ABNORMAL HIGH (ref 4.0–10.5)
WBC: 8.8 10*3/uL (ref 4.0–10.5)
nRBC: 0 % (ref 0.0–0.2)
nRBC: 0 % (ref 0.0–0.2)

## 2022-10-18 LAB — D-DIMER, QUANTITATIVE: D-Dimer, Quant: 0.33 ug/mL-FEU (ref 0.00–0.50)

## 2022-10-18 LAB — COMPREHENSIVE METABOLIC PANEL
ALT: 9 U/L (ref 0–44)
AST: 13 U/L — ABNORMAL LOW (ref 15–41)
Albumin: 3.5 g/dL (ref 3.5–5.0)
Alkaline Phosphatase: 53 U/L (ref 38–126)
Anion gap: 8 (ref 5–15)
BUN: <5 mg/dL — ABNORMAL LOW (ref 6–20)
CO2: 21 mmol/L — ABNORMAL LOW (ref 22–32)
Calcium: 8.4 mg/dL — ABNORMAL LOW (ref 8.9–10.3)
Chloride: 109 mmol/L (ref 98–111)
Creatinine, Ser: 0.69 mg/dL (ref 0.44–1.00)
GFR, Estimated: >60 mL/min (ref >60)
Glucose, Bld: 92 mg/dL (ref 70–99)
Potassium: 3.6 mmol/L (ref 3.5–5.1)
Sodium: 138 mmol/L (ref 135–145)
Total Bilirubin: 0.4 mg/dL (ref 0.3–1.2)
Total Protein: 7.2 g/dL (ref 6.5–8.1)

## 2022-10-18 LAB — TSH: TSH: 0.94 u[IU]/mL (ref 0.350–4.500)

## 2022-10-18 LAB — FERRITIN: Ferritin: 12 ng/mL (ref 11–307)

## 2022-10-18 LAB — RESP PANEL BY RT-PCR (RSV, FLU A&B, COVID)  RVPGX2
Influenza A by PCR: NEGATIVE
Influenza B by PCR: NEGATIVE
Resp Syncytial Virus by PCR: NEGATIVE
SARS Coronavirus 2 by RT PCR: NEGATIVE

## 2022-10-18 LAB — RAPID URINE DRUG SCREEN, HOSP PERFORMED
Amphetamines: NOT DETECTED
Barbiturates: NOT DETECTED
Benzodiazepines: NOT DETECTED
Cocaine: NOT DETECTED
Opiates: NOT DETECTED
Tetrahydrocannabinol: NOT DETECTED

## 2022-10-18 LAB — VITAMIN B12: Vitamin B-12: 445 pg/mL (ref 180–914)

## 2022-10-18 LAB — ETHANOL: Alcohol, Ethyl (B): <10 mg/dL (ref <10)

## 2022-10-18 LAB — CK: Total CK: 55 U/L (ref 38–234)

## 2022-10-18 LAB — TROPONIN I (HIGH SENSITIVITY)
Troponin I (High Sensitivity): 3 ng/L (ref <18)
Troponin I (High Sensitivity): <2 ng/L (ref <18)

## 2022-10-18 LAB — MAGNESIUM: Magnesium: 2.2 mg/dL (ref 1.7–2.4)

## 2022-10-18 LAB — IRON AND TIBC
Iron: 54 ug/dL (ref 28–170)
Saturation Ratios: 13 % (ref 10.4–31.8)
TIBC: 406 ug/dL (ref 250–450)
UIBC: 352 ug/dL

## 2022-10-18 LAB — HCG, SERUM, QUALITATIVE: Preg, Serum: NEGATIVE

## 2022-10-18 LAB — PHOSPHORUS: Phosphorus: 2.6 mg/dL (ref 2.5–4.6)

## 2022-10-18 LAB — RETICULOCYTES
Immature Retic Fract: 10.1 % (ref 2.3–15.9)
RBC.: 3.82 MIL/uL — ABNORMAL LOW (ref 3.87–5.11)
Retic Count, Absolute: 49.3 10*3/uL (ref 19.0–186.0)
Retic Ct Pct: 1.3 % (ref 0.4–3.1)

## 2022-10-18 LAB — PROCALCITONIN: Procalcitonin: 0.12 ng/mL

## 2022-10-18 LAB — LACTIC ACID, PLASMA: Lactic Acid, Venous: 1.1 mmol/L (ref 0.5–1.9)

## 2022-10-18 MED ORDER — HYDROCODONE-ACETAMINOPHEN 5-325 MG PO TABS: 1.0000 | ORAL_TABLET | ORAL | Status: DC | PRN

## 2022-10-18 MED ORDER — MAGNESIUM SULFATE 2 GM/50ML IV SOLN
2.0000 g | Freq: Once | INTRAVENOUS | Status: AC
  Administered 2022-10-18: 2 g via INTRAVENOUS
  Filled 2022-10-18: qty 50

## 2022-10-18 MED ORDER — ACETAMINOPHEN 650 MG RE SUPP: 650.0000 mg | Freq: Four times a day (QID) | RECTAL | Status: DC | PRN

## 2022-10-18 MED ORDER — ACETAMINOPHEN 325 MG PO TABS: 650.0000 mg | ORAL_TABLET | Freq: Four times a day (QID) | ORAL | Status: DC | PRN

## 2022-10-18 MED ORDER — SODIUM CHLORIDE 0.9 % IV SOLN: INTRAVENOUS | Status: AC

## 2022-10-18 MED ORDER — LORAZEPAM 2 MG/ML IJ SOLN
0.5000 mg | Freq: Once | INTRAMUSCULAR | Status: AC
  Administered 2022-10-18: 0.5 mg via INTRAVENOUS
  Filled 2022-10-18: qty 1

## 2022-10-18 MED ORDER — SODIUM CHLORIDE 0.9 % IV BOLUS
1000.0000 mL | Freq: Once | INTRAVENOUS | Status: AC
  Administered 2022-10-18: 1000 mL via INTRAVENOUS

## 2022-10-18 MED ORDER — METOPROLOL TARTRATE 5 MG/5ML IV SOLN
2.5000 mg | INTRAVENOUS | Status: AC
  Administered 2022-10-18: 2.5 mg via INTRAVENOUS
  Filled 2022-10-18: qty 5

## 2022-10-18 MED ORDER — ONDANSETRON HCL 4 MG/2ML IJ SOLN: 4.0000 mg | Freq: Four times a day (QID) | INTRAMUSCULAR | Status: DC | PRN

## 2022-10-18 MED ORDER — HALOPERIDOL LACTATE 5 MG/ML IJ SOLN: 5.0000 mg | Freq: Once | INTRAMUSCULAR | Status: DC

## 2022-10-18 MED ORDER — POTASSIUM CHLORIDE CRYS ER 20 MEQ PO TBCR: 20.0000 meq | EXTENDED_RELEASE_TABLET | Freq: Two times a day (BID) | ORAL | 0 refills | Status: DC

## 2022-10-18 MED ORDER — POTASSIUM CHLORIDE 10 MEQ/100ML IV SOLN
10.0000 meq | INTRAVENOUS | Status: AC
  Administered 2022-10-18 (×2): 10 meq via INTRAVENOUS
  Filled 2022-10-18 (×2): qty 100

## 2022-10-18 MED ORDER — ONDANSETRON HCL 4 MG PO TABS: 4.0000 mg | ORAL_TABLET | Freq: Four times a day (QID) | ORAL | Status: DC | PRN

## 2022-10-18 MED ORDER — POTASSIUM CHLORIDE CRYS ER 20 MEQ PO TBCR
80.0000 meq | EXTENDED_RELEASE_TABLET | Freq: Once | ORAL | Status: AC
  Administered 2022-10-18: 80 meq via ORAL
  Filled 2022-10-18: qty 4

## 2022-10-18 MED ORDER — SODIUM CHLORIDE 0.9 % IV BOLUS
500.0000 mL | Freq: Once | INTRAVENOUS | Status: AC
  Administered 2022-10-18: 500 mL via INTRAVENOUS

## 2022-10-18 NOTE — ED Notes (Signed)
ED TO INPATIENT HANDOFF REPORT  ED Nurse Name and Phone #:  Zella Ball [phone]  S Name/Age/Gender Brianna Byrd 27 y.o. female Room/Bed: MH11/MH11  Code Status   Code Status: Prior  Home/SNF/Other Home Patient oriented to: self, place, time, and situation Is this baseline? Yes   Triage Complete: Triage complete  Chief Complaint Sinus tachycardia [R00.0]  Triage Note Pt states woke up about 0100 with fast heart rate and trembles. States nothing new that could have caused it.    Allergies Allergies  Allergen Reactions   Peach [Prunus Persica] Swelling    Anything tropical   Compazine [Prochlorperazine] Anxiety    Severe akathisia (use ativan instead of diphenhydramine for HA cocktails)     Miconazole Swelling and Other (See Comments)    Level of Care/Admitting Diagnosis ED Disposition     ED Disposition  Admit   Condition  --   Comment  Hospital Area: [facility] [[id number]]  Level of Care: Progressive [102]  Admit to Progressive based on following criteria: CARDIOVASCULAR & THORACIC of moderate stability with acute coronary syndrome symptoms/low risk myocardial infarction/hypertensive urgency/arrhythmias/heart failure potentially compromising stability and stable post cardiovascular intervention patients.  Interfacility transfer: Yes  May place patient in observation at [facility] or [facility] if equivalent level of care is available:: Yes  Covid Evaluation: Asymptomatic - no recent exposure (last 10 days) testing not required  Diagnosis: Sinus tachycardia [696295]  Admitting Physician: Arlean Hopping [[id number]]  Attending Physician: Cy Blamer [[id number]]          B Medical/Surgery History Past Medical History:  Diagnosis Date   Asthma    Obesity    Reflux    Past Surgical History:  Procedure Laterality Date   NO PAST SURGERIES     UPPER GASTROINTESTINAL ENDOSCOPY  08/28/2019     A IV Location/Drains/Wounds Patient  Lines/Drains/Airways Status     Active Line/Drains/Airways     Name Placement date Placement time Site Days   Peripheral IV 10/18/22 20 G Right Antecubital 10/18/22  0411  Antecubital  less than 1            Intake/Output Last 24 hours  Intake/Output Summary (Last 24 hours) at 10/18/2022 1228 Last data filed at 10/18/2022 1049 Gross per 24 hour  Intake 2243.44 ml  Output --  Net 2243.44 ml    Labs/Imaging Results for orders placed or performed during the hospital encounter of 10/18/22 (from the past 48 hour(s))  Basic metabolic panel     Status: Abnormal   Collection Time: 10/18/22  4:17 AM  Result Value Ref Range   Sodium 136 135 - 145 mmol/L   Potassium 2.6 (LL) 3.5 - 5.1 mmol/L    Comment: CRITICAL RESULT CALLED TO, READ BACK BY AND VERIFIED WITH CALLED TO ALFRED DILLARD RN 0457 10/18/2022 BY KENYATTA BOWMAN    Chloride 104 98 - 111 mmol/L   CO2 23 22 - 32 mmol/L   Glucose, Bld 114 (H) 70 - 99 mg/dL    Comment: Glucose reference range applies only to samples taken after fasting for at least 8 hours.   BUN <5 (L) 6 - 20 mg/dL   Creatinine, Ser 4.01 0.44 - 1.00 mg/dL   Calcium 8.9 8.9 - 02.7 mg/dL   GFR, Estimated >25 >36 mL/min    Comment: (NOTE) Calculated using the CKD-EPI Creatinine Equation (2021)    Anion gap 9 5 - 15    Comment: Performed at [facility], [address]  [place], [place] [address]  Troponin I (High Sensitivity)     Status: None   Collection Time: 10/18/22  4:17 AM  Result Value Ref Range   Troponin I (High Sensitivity) 3 <18 ng/L    Comment: (NOTE) Elevated high sensitivity troponin I (hsTnI) values and significant  changes across serial measurements may suggest ACS but many other  chronic and acute conditions are known to elevate hsTnI results.  Refer to the "Links" section for chest pain algorithms and additional  guidance. Performed at [facility], [address] [place], [place] [address]   CBC with  Differential     Status: Abnormal   Collection Time: 10/18/22  4:18 AM  Result Value Ref Range   WBC 12.0 (H) 4.0 - 10.5 K/uL   RBC 4.16 3.87 - 5.11 MIL/uL   Hemoglobin 11.1 (L) 12.0 - 15.0 g/dL   HCT 09.8 (L) 11.9 - 14.7 %   MCV 82.7 80.0 - 100.0 fL   MCH 26.7 26.0 - 34.0 pg   MCHC 32.3 30.0 - 36.0 g/dL   RDW 82.9 56.2 - 13.0 %   Platelets 322 150 - 400 K/uL   nRBC 0.0 0.0 - 0.2 %   Neutrophils Relative % 74 %   Neutro Abs 8.9 (H) 1.7 - 7.7 K/uL   Lymphocytes Relative 18 %   Lymphs Abs 2.1 0.7 - 4.0 K/uL   Monocytes Relative 8 %   Monocytes Absolute 0.9 0.1 - 1.0 K/uL   Eosinophils Relative 0 %   Eosinophils Absolute 0.0 0.0 - 0.5 K/uL   Basophils Relative 0 %   Basophils Absolute 0.0 0.0 - 0.1 K/uL   Immature Granulocytes 0 %   Abs Immature Granulocytes 0.05 0.00 - 0.07 K/uL    Comment: Performed at [facility], [address] [place], [place] [address]  hCG, serum, qualitative     Status: None   Collection Time: 10/18/22  4:18 AM  Result Value Ref Range   Preg, Serum NEGATIVE NEGATIVE    Comment:        THE SENSITIVITY OF THIS METHODOLOGY IS >10 mIU/mL. Performed at [facility], [address] [place], [place] [address]   Resp panel by RT-PCR (RSV, Flu A&B, Covid) Anterior Nasal Swab     Status: None   Collection Time: 10/18/22  6:29 AM   Specimen: Anterior Nasal Swab  Result Value Ref Range   SARS Coronavirus 2 by RT PCR NEGATIVE NEGATIVE    Comment: (NOTE) SARS-CoV-2 target nucleic acids are NOT DETECTED.  The SARS-CoV-2 RNA is generally detectable in upper respiratory specimens during the acute phase of infection. The lowest concentration of SARS-CoV-2 viral copies this assay can detect is 138 copies/mL. A negative result does not preclude SARS-Cov-2 infection and should not be used as the sole basis for treatment or other patient management decisions. A negative result may occur with  improper specimen collection/handling, submission  of specimen other than nasopharyngeal swab, presence of viral mutation(s) within the areas targeted by this assay, and inadequate number of viral copies(<138 copies/mL). A negative result must be combined with clinical observations, patient history, and epidemiological information. The expected result is Negative.  Fact Sheet for Patients:  BloggerCourse.com  Fact Sheet for Healthcare Providers:  SeriousBroker.it  This test is no t yet approved or cleared by the [place] FDA and  has been authorized for detection and/or diagnosis of SARS-CoV-2 by FDA under an Emergency Use Authorization (  EUA). This EUA will remain  in effect (meaning this test can be used) for the duration of the COVID-19 declaration under Section 564(b)(1) of the Act, 21 U.S.C.section 360bbb-3(b)(1), unless the authorization is terminated  or revoked sooner.       Influenza A by PCR NEGATIVE NEGATIVE   Influenza B by PCR NEGATIVE NEGATIVE    Comment: (NOTE) The Xpert Xpress SARS-CoV-2/FLU/RSV plus assay is intended as an aid in the diagnosis of influenza from Nasopharyngeal swab specimens and should not be used as a sole basis for treatment. Nasal washings and aspirates are unacceptable for Xpert Xpress SARS-CoV-2/FLU/RSV testing.  Fact Sheet for Patients: BloggerCourse.com  Fact Sheet for Healthcare Providers: SeriousBroker.it  This test is not yet approved or cleared by the [place] FDA and has been authorized for detection and/or diagnosis of SARS-CoV-2 by FDA under an Emergency Use Authorization (EUA). This EUA will remain in effect (meaning this test can be used) for the duration of the COVID-19 declaration under Section 564(b)(1) of the Act, 21 U.S.C. section 360bbb-3(b)(1), unless the authorization is terminated or revoked.     Resp Syncytial Virus by PCR NEGATIVE NEGATIVE    Comment:  (NOTE) Fact Sheet for Patients: BloggerCourse.com  Fact Sheet for Healthcare Providers: SeriousBroker.it  This test is not yet approved or cleared by the [place] FDA and has been authorized for detection and/or diagnosis of SARS-CoV-2 by FDA under an Emergency Use Authorization (EUA). This EUA will remain in effect (meaning this test can be used) for the duration of the COVID-19 declaration under Section 564(b)(1) of the Act, 21 U.S.C. section 360bbb-3(b)(1), unless the authorization is terminated or revoked.  Performed at [facility], [address] [place], [place] [address]   Troponin I (High Sensitivity)     Status: None   Collection Time: 10/18/22  7:43 AM  Result Value Ref Range   Troponin I (High Sensitivity) <2 <18 ng/L    Comment: (NOTE) Elevated high sensitivity troponin I (hsTnI) values and significant  changes across serial measurements may suggest ACS but many other  chronic and acute conditions are known to elevate hsTnI results.  Refer to the "Links" section for chest pain algorithms and additional  guidance. Performed at [facility], [address] [place], [place] [address]   D-dimer, quantitative     Status: None   Collection Time: 10/18/22  9:18 AM  Result Value Ref Range   D-Dimer, Quant 0.33 0.00 - 0.50 ug/mL-FEU    Comment: (NOTE) At the manufacturer cut-off value of 0.5 g/mL FEU, this assay has a negative predictive value of 95-100%.This assay is intended for use in conjunction with a clinical pretest probability (PTP) assessment model to exclude pulmonary embolism (PE) and deep venous thrombosis (DVT) in outpatients suspected of PE or DVT. Results should be correlated with clinical presentation. Performed at [facility], [address] [place], [place] [address]    No results found.  Pending Labs Unresulted Labs (From admission, onward)    None        Vitals/Pain Today's Vitals   10/18/22 1045 10/18/22 1049 10/18/22 1100 10/18/22 1115  BP: 98/70  118/80 117/74  Pulse: 87  81 82  Resp: 18  18 17   Temp:      TempSrc:      SpO2: 100%  100% 100%  Weight:      Height:      PainSc:  0-No pain  Isolation Precautions No active isolations  Medications Medications  sodium chloride 0.9 % bolus 1,000 mL ( Intravenous Stopped 10/18/22 0517)  LORazepam (ATIVAN) injection 0.5 mg (0.5 mg Intravenous Given 10/18/22 0420)  metoprolol tartrate (LOPRESSOR) injection 2.5 mg (2.5 mg Intravenous Given 10/18/22 0535)  magnesium sulfate IVPB 2 g 50 mL (0 g Intravenous Stopped 10/18/22 0522)  potassium chloride SA (KLOR-CON M) CR tablet 80 mEq (80 mEq Oral Given 10/18/22 0506)  sodium chloride 0.9 % bolus 1,000 mL (0 mLs Intravenous Stopped 10/18/22 0854)  potassium chloride 10 mEq in 100 mL IVPB (0 mEq Intravenous Stopped 10/18/22 1049)  metoprolol tartrate (LOPRESSOR) injection 2.5 mg (2.5 mg Intravenous Given 10/18/22 0655)    Mobility walks     Focused Assessments Cardiac Assessment Handoff:  Cardiac Rhythm: Normal sinus rhythm No results found for: "CKTOTAL", "CKMB", "CKMBINDEX", "TROPONINI" Lab Results  Component Value Date   DDIMER 0.33 10/18/2022   Does the Patient currently have chest pain? No    R Recommendations: See Admitting Provider Note  Report given to:   Additional Notes:

## 2022-10-18 NOTE — ED Provider Notes (Signed)
[facility] Provider Note   CSN: [id number] Arrival date & time: 10/18/22  8657     History  Chief Complaint  Patient presents with   Tachycardia    Brianna Byrd is a 27 y.o. female.  The history is provided by the patient.  Palpitations Palpitations quality:  Fast Onset quality:  Sudden Timing:  Constant Progression:  Unchanged Chronicity:  New Context comment:  Awakening from sleep Relieved by:  Nothing Worsened by:  Nothing Ineffective treatments:  None tried Associated symptoms: no chest pain, no chest pressure, no dizziness, no malaise/fatigue, no nausea, no near-syncope, no shortness of breath, no vomiting and no weakness   Associated symptoms comment:  Tremulous Risk factors: no hx of PE, no hyperthyroidism and no OTC sinus medications   Patient recently diagnosed with bacterial vaginosis presents with palpitations.    Past Medical History:  Diagnosis Date   Asthma    Obesity    Reflux        Home Medications Prior to Admission medications   Medication Sig Start Date End Date Taking? Authorizing Provider  potassium chloride SA (KLOR-CON M) 20 MEQ tablet Take 1 tablet (20 mEq total) by mouth 2 (two) times daily. 10/18/22  Yes Kalany Diekmann, MD  celecoxib (CELEBREX) 100 MG capsule Take 1 capsule (100 mg total) by mouth 2 (two) times daily. 12/27/20   Dione Booze, MD  cephALEXin (KEFLEX) 500 MG capsule Take 1 capsule (500 mg total) by mouth 4 (four) times daily. 11/19/21   Geoffery Lyons, MD  Cyanocobalamin (VITAMIN B-12) 5000 MCG SUBL Place 1 tablet (5,000 mcg total) under the tongue daily. 07/28/20 04/17/21  Delano Metz, MD  cyclobenzaprine (FLEXERIL) 10 MG tablet Take 1 tablet (10 mg total) by mouth 3 (three) times daily as needed for muscle spasms. Patient not taking: Reported on 05/18/2021 12/27/20   Dione Booze, MD  diclofenac (VOLTAREN) 75 MG EC tablet Take 1 tablet (75 mg total) by mouth 2 (two) times  daily. Patient not taking: Reported on 05/18/2021 04/24/21   Elson Areas, PA-C  erythromycin ophthalmic ointment Place a 1/2 inch ribbon of ointment into the lower eyelid. 03/12/21   Marita Kansas, PA-C  fluconazole (DIFLUCAN) 150 MG tablet Take 1 tablet (150 mg total) by mouth daily. 09/22/22   Achille Rich, PA-C  meclizine (ANTIVERT) 25 MG tablet Take 1 tablet (25 mg total) by mouth 3 (three) times daily as needed for dizziness. Patient not taking: Reported on 05/18/2021 09/07/20   Terrilee Files, MD  metroNIDAZOLE (FLAGYL) 500 MG tablet Take 1 tablet (500 mg total) by mouth 2 (two) times daily for 7 days. 10/16/22 10/23/22  Coral Spikes, DO  naproxen (NAPROSYN) 500 MG tablet Take 1 tablet (500 mg total) by mouth 2 (two) times daily. 01/25/22   Jeanelle Malling, PA  ondansetron (ZOFRAN) 4 MG tablet Take 1 tablet (4 mg total) by mouth 3 (three) times daily as needed for nausea or vomiting. Take before meals Patient not taking: Reported on 05/18/2021 04/17/21   Arnaldo Natal, NP  pantoprazole (PROTONIX) 40 MG tablet Take 1 tablet (40 mg total) by mouth 2 (two) times daily before a meal. Patient not taking: Reported on 05/18/2021 04/17/21   Arnaldo Natal, NP  rizatriptan (MAXALT-MLT) 10 MG disintegrating tablet Take 1 tablet earliest onset of migraine.  May repeat in 2 hours if needed.  Maximum 2 tablets in 24 hours. Patient not taking: Reported on 05/18/2021 02/13/21   Everlena Cooper,  Adam R, DO  simethicone (GAS-X) 80 MG chewable tablet Chew 1 tablet (80 mg total) by mouth every 6 (six) hours as needed for flatulence. 12/27/20   Dione Booze, MD  sucralfate (CARAFATE) 1 g tablet Take 1 tablet (1 g total) by mouth 4 (four) times daily -  with meals and at bedtime. 06/24/22   Geoffery Lyons, MD  topiramate (TOPAMAX) 50 MG tablet Take 1/2 tablet at bedtime for one week, then increase to 1 tablet at bedtime 02/13/21   Drema Dallas, DO  valACYclovir (VALTREX) 1000 MG tablet Take 1 tablet (1,000 mg total) by mouth  3 (three) times daily. 11/19/21   Geoffery Lyons, MD  esomeprazole (NEXIUM) 40 MG capsule Take 1 capsule (40 mg total) by mouth daily before breakfast. 08/28/19 05/18/20  Mansouraty, Netty Starring., MD  famotidine (PEPCID) 20 MG tablet Take 1 tablet (20 mg total) by mouth 2 (two) times daily. 10/24/19 05/18/20  Renne Crigler, PA-C  fluticasone (FLONASE) 50 MCG/ACT nasal spray Place 2 sprays into both nostrils daily. Patient not taking: Reported on 01/27/2019 06/05/18 05/01/19  Kara Mierzejewski, MD  medroxyPROGESTERone (PROVERA) 10 MG tablet Take 1 tablet (10 mg total) by mouth daily for 7 days. 03/04/19 05/01/19  Sharlene Dory, DO  SUMAtriptan (IMITREX) 100 MG tablet Take 1 tablet (100 mg total) by mouth every 2 (two) hours as needed for migraine. May repeat in 2 hours if headache persists or recurs. Patient not taking: Reported on 03/27/2019 03/04/19 05/01/19  Sharlene Dory, DO      Allergies    Peach [prunus persica], Compazine [prochlorperazine], and Miconazole    Review of Systems   Review of Systems  Constitutional:  Negative for fever and malaise/fatigue.  Respiratory:  Negative for shortness of breath, wheezing and stridor.   Cardiovascular:  Positive for palpitations. Negative for chest pain and near-syncope.  Gastrointestinal:  Negative for nausea and vomiting.  Neurological:  Positive for tremors. Negative for dizziness and weakness.  All other systems reviewed and are negative.   Physical Exam Updated Vital Signs BP (!) 137/94 (BP Location: Left Arm)   Pulse (!) 124   Temp 98.9 F (37.2 C) (Oral)   Resp 18   Ht 5\' 1"  (1.549 m)   Wt 89.3 kg   LMP 09/24/2022 (Exact Date)   SpO2 100%   BMI 37.20 kg/m  Physical Exam Vitals and nursing note reviewed. Exam conducted with a chaperone present.  Constitutional:      General: She is not in acute distress.    Appearance: She is well-developed.  HENT:     Head: Normocephalic and atraumatic.     Nose: Nose normal.  Eyes:      Pupils: Pupils are equal, round, and reactive to light.  Cardiovascular:     Rate and Rhythm: Regular rhythm. Tachycardia present.     Pulses: Normal pulses.     Heart sounds: Normal heart sounds.  Pulmonary:     Effort: Pulmonary effort is normal. No respiratory distress.     Breath sounds: Normal breath sounds.  Abdominal:     General: Bowel sounds are normal. There is no distension.     Palpations: Abdomen is soft.     Tenderness: There is no abdominal tenderness. There is no guarding or rebound.  Genitourinary:    Vagina: No vaginal discharge.  Musculoskeletal:        General: Normal range of motion.     Cervical back: Neck supple.  Skin:    General:  Skin is warm and dry.     Capillary Refill: Capillary refill takes less than 2 seconds.     Findings: No erythema or rash.  Neurological:     General: No focal deficit present.     Mental Status: She is oriented to person, place, and time.     Deep Tendon Reflexes: Reflexes normal.  Psychiatric:        Mood and Affect: Mood normal.        Behavior: Behavior normal.     ED Results / Procedures / Treatments   Labs (all labs ordered are listed, but only abnormal results are displayed) Results for orders placed or performed during the hospital encounter of 10/18/22  Basic metabolic panel  Result Value Ref Range   Sodium 136 135 - 145 mmol/L   Potassium 2.6 (LL) 3.5 - 5.1 mmol/L   Chloride 104 98 - 111 mmol/L   CO2 23 22 - 32 mmol/L   Glucose, Bld 114 (H) 70 - 99 mg/dL   BUN <5 (L) 6 - 20 mg/dL   Creatinine, Ser 1.61 0.44 - 1.00 mg/dL   Calcium 8.9 8.9 - 09.6 mg/dL   GFR, Estimated >04 >54 mL/min   Anion gap 9 5 - 15  CBC with Differential  Result Value Ref Range   WBC 12.0 (H) 4.0 - 10.5 K/uL   RBC 4.16 3.87 - 5.11 MIL/uL   Hemoglobin 11.1 (L) 12.0 - 15.0 g/dL   HCT 09.8 (L) 11.9 - 14.7 %   MCV 82.7 80.0 - 100.0 fL   MCH 26.7 26.0 - 34.0 pg   MCHC 32.3 30.0 - 36.0 g/dL   RDW 82.9 56.2 - 13.0 %   Platelets 322 150  - 400 K/uL   nRBC 0.0 0.0 - 0.2 %   Neutrophils Relative % 74 %   Neutro Abs 8.9 (H) 1.7 - 7.7 K/uL   Lymphocytes Relative 18 %   Lymphs Abs 2.1 0.7 - 4.0 K/uL   Monocytes Relative 8 %   Monocytes Absolute 0.9 0.1 - 1.0 K/uL   Eosinophils Relative 0 %   Eosinophils Absolute 0.0 0.0 - 0.5 K/uL   Basophils Relative 0 %   Basophils Absolute 0.0 0.0 - 0.1 K/uL   Immature Granulocytes 0 %   Abs Immature Granulocytes 0.05 0.00 - 0.07 K/uL  hCG, serum, qualitative  Result Value Ref Range   Preg, Serum NEGATIVE NEGATIVE  Troponin I (High Sensitivity)  Result Value Ref Range   Troponin I (High Sensitivity) 3 <18 ng/L   DG Chest Port 1 View  Result Date: 10/05/2022 CLINICAL DATA:  Left-sided chest pain. EXAM: PORTABLE CHEST 1 VIEW COMPARISON:  06/24/2022 FINDINGS: The heart size and mediastinal contours are within normal limits. Both lungs are clear. The visualized skeletal structures are unremarkable. IMPRESSION: No active disease. Electronically Signed   By: Kennith Center M.D.   On: 10/05/2022 05:22    EKG EKG Interpretation Date/Time:  Thursday October 18 2022 03:42:02 EDT Ventricular Rate:  134 PR Interval:  122 QRS Duration:  90 QT Interval:  311 QTC Calculation: 465 R Axis:   85  Text Interpretation: Sinus tachycardia Confirmed by Tonny Isensee ([id number]) on 10/18/2022 3:58:31 AM  Radiology No results found.  Procedures Procedures    Medications Ordered in ED Medications  potassium chloride 10 mEq in 100 mL IVPB (has no administration in time range)  sodium chloride 0.9 % bolus 1,000 mL ( Intravenous Stopped 10/18/22 0517)  LORazepam (ATIVAN) injection 0.5 mg (  0.5 mg Intravenous Given 10/18/22 0420)  metoprolol tartrate (LOPRESSOR) injection 2.5 mg (2.5 mg Intravenous Given 10/18/22 0535)  magnesium sulfate IVPB 2 g 50 mL (0 g Intravenous Stopped 10/18/22 0522)  potassium chloride SA (KLOR-CON M) CR tablet 80 mEq (80 mEq Oral Given 10/18/22 0506)  sodium chloride 0.9 % bolus 1,000 mL  (1,000 mLs Intravenous New Bag/Given 10/18/22 2355)    ED Course/ Medical Decision Making/ A&P                                 Medical Decision Making Patient with sudden onset palpitations   Amount and/or Complexity of Data Reviewed External Data Reviewed: labs and notes.    Details: Previous notes and labs reviewed  Labs: ordered.    Details: White count slight elevation 12, hemoglobin low 11.1 but at baseline, normal platelets 322.  Normal sodium 136, low potassium 2.6, normal creatinine .74 ECG/medicine tests: ordered and independent interpretation performed.  Risk Prescription drug management. Decision regarding hospitalization. Risk Details: Admitted due to persistent tachycardia     Final Clinical Impression(s) / ED Diagnoses Final diagnoses:  Tachycardia  Hypokalemia   The patient appears reasonably stabilized for admission considering the current resources, flow, and capabilities available in the ED at this time, and I doubt any other [facility] requiring further screening and/or treatment in the ED prior to admission.  Rx / DC Orders ED Discharge Orders          Ordered    potassium chloride SA (KLOR-CON M) 20 MEQ tablet  2 times daily        10/18/22 0510              Gordy Goar, MD 10/18/22 [id number]

## 2022-10-18 NOTE — ED Triage Notes (Signed)
Pt states woke up about 0100 with fast heart rate and trembles. States nothing new that could have caused it.

## 2022-10-18 NOTE — Assessment & Plan Note (Signed)
Mild obtain anemia panel patient denies any bleeding

## 2022-10-18 NOTE — Assessment & Plan Note (Signed)
Now resolved check TSH Troponin unremarkable Obtain echo, for completion

## 2022-10-18 NOTE — Assessment & Plan Note (Signed)
Order gluten-free diet

## 2022-10-18 NOTE — Subjective & Objective (Signed)
Patient woke up this morning with palpitations around 1 AM not sure what has caused that she felt tremulous Heart rate in 130s 140s sinus tachycardia otherwise vital signs stable noted to have white blood cell count of 12 and potassium down to 2.6 while her potassium was infusing she developed short episode of chest pressure D-dimer unremarkable patient was rehydrated and transferred to [facility] for observation at the time of arrival heart rate below 80s

## 2022-10-18 NOTE — Progress Notes (Signed)
Plan of Care Note for accepted transfer   Patient: Brianna Byrd MRN: [id number]   DOA: 10/18/2022  Facility requesting transfer: [facility] ED Requesting Provider: Dr. Nicanor Alcon Reason for transfer: Sinus tachycardia Facility course: 27 year old female with history of asthma, obesity (BMI 37.20), GERD, migraine headaches.  She was recently seen in the ED on 7/30 for vaginal discharge and treated for bacterial vaginosis.  She returns to the ED tonight with complaints of palpitations and tremulousness.  Initially tachycardic to the 130-140s but heart rate intermittently as high as 160s (sinus tachycardia).  Remainder vital signs stable. Labs significant for WBC 12.0, hemoglobin 11.1 (stable), potassium 2.6, troponin negative x 1. Patient received Ativan, IV metoprolol 2.5 mg, oral potassium 80 mEq, IV potassium 10 mEq x 2, IV magnesium 2 g, and 2 L normal saline boluses in the ED. Remains tachycardic with heart rate ranging between 110-140s.  Notified by EDP that TSH cannot be checked while patient is at [facility].  I have requested EDP to order urine drug screen.  Plan of care: The patient is accepted for admission to Progressive unit, at [facility]..   [facility] will assume care on arrival to accepting facility. Until arrival, care as per EDP. However, [facility] available 24/7 for questions and assistance.  Nursing staff, please page [facility] Admits and Consults [phone]) as soon as the patient arrives to the hospital.    Author: John Giovanni, MD 10/18/2022  Check [email or web address] for on-call coverage.

## 2022-10-18 NOTE — H&P (Signed)
Brianna Byrd GEX:[id number] DOB: 07/31/1995 DOA: 10/18/2022     PCP: Bailey Mech, PA-C   Outpatient Specialists: NONE   Patient arrived to ER on 10/18/22 at 0313 Referred by Attending Default, Provider, MD   Patient coming from:    home Lives With family    Chief Complaint:   Chief Complaint  Patient presents with   Tachycardia    HPI: Brianna Byrd is a 27 y.o. female with medical history significant of asthma migraine, obesity, GERD, celiac disease     Presented with palpitations Patient woke up this morning with palpitations around 1 AM not sure what has caused that she felt tremulous Heart rate in 130s 140s sinus tachycardia otherwise vital signs stable noted to have white blood cell count of 12 and potassium down to 2.6 while her potassium was infusing she developed short episode of chest pressure D-dimer unremarkable patient was rehydrated and transferred to [facility] for observation at the time of arrival heart rate below 80s   Her appetite has been bad lately Pt states she has not taken flagyl but has been using diflucan Denies any bleeding   Denies significant ETOH intake  Does not smoke  Lab Results  Component Value Date   SARSCOV2NAA NEGATIVE 10/18/2022   SARSCOV2NAA NEGATIVE 04/24/2021   SARSCOV2NAA NEGATIVE 10/03/2020   SARSCOV2NAA RESULT: NEGATIVE 08/26/2019      Regarding pertinent Chronic problems:          obesity-   BMI Readings from Last 1 Encounters:  10/18/22 37.20 kg/m       Asthma -well  controlled on home inhalers/ nebs                   Has not needed recent albuterol      Chronic anemia - baseline hg Hemoglobin & Hematocrit  Recent Labs    06/23/22 2352 10/05/22 0010 10/18/22 0418  HGB 11.5* 11.0* 11.1*   Iron/TIBC/Ferritin/ %Sat    Component Value Date/Time   IRON 37 (L) 04/17/2021 1641   IRON 37 (L) 04/17/2021 1641   TIBC 406.0 04/17/2021 1641   FERRITIN 7.3 (L) 04/17/2021 1641   IRONPCTSAT  9.1 (L) 04/17/2021 1641     While in ER: Clinical Course as of 10/18/22 1853  Thu Oct 18, 2022  0658 Recent BV dx, admit, hypokalemic, tachy not improving [JD]    Clinical Course User Index [JD] Laurence Spates, MD       Lab Orders         Resp panel by RT-PCR (RSV, Flu A&B, Covid) Anterior Nasal Swab         Basic metabolic panel         CBC with Differential         hCG, serum, qualitative         D-dimer, quantitative         Comprehensive metabolic panel         CK         Magnesium         Phosphorus         TSH         T4, free         T3         CBC with Differential/Platelet         Vitamin B12         Folate         Iron and TIBC  Ferritin         Reticulocytes         Ethanol         Rapid urine drug screen (hospital performed)       Following Medications were ordered in ER: Medications  sodium chloride 0.9 % bolus 1,000 mL ( Intravenous Stopped 10/18/22 0517)  LORazepam (ATIVAN) injection 0.5 mg (0.5 mg Intravenous Given 10/18/22 0420)  metoprolol tartrate (LOPRESSOR) injection 2.5 mg (2.5 mg Intravenous Given 10/18/22 0535)  magnesium sulfate IVPB 2 g 50 mL (0 g Intravenous Stopped 10/18/22 0522)  potassium chloride SA (KLOR-CON M) CR tablet 80 mEq (80 mEq Oral Given 10/18/22 0506)  sodium chloride 0.9 % bolus 1,000 mL (0 mLs Intravenous Stopped 10/18/22 0854)  potassium chloride 10 mEq in 100 mL IVPB (0 mEq Intravenous Stopped 10/18/22 1049)  metoprolol tartrate (LOPRESSOR) injection 2.5 mg (2.5 mg Intravenous Given 10/18/22 0655)  sodium chloride 0.9 % bolus 500 mL ( Intravenous Stopped 10/18/22 1615)    ___________  ED Triage Vitals  Encounter Vitals Group     BP 10/18/22 0321 (!) 148/99     Systolic BP Percentile --      Diastolic BP Percentile --      Pulse Rate 10/18/22 0321 (!) 133     Resp 10/18/22 0321 20     Temp 10/18/22 0321 99.1 F (37.3 C)     Temp Source 10/18/22 0321 Oral     SpO2 10/18/22 0321 100 %     Weight 10/18/22 0319 196 lb 13.9  oz (89.3 kg)     Height 10/18/22 0319 5\' 1"  (1.549 m)     Head Circumference --      Peak Flow --      Pain Score 10/18/22 0319 0     Pain Loc --      Pain Education --      Exclude from Growth Chart --   WUJW(11)@     _________________________________________ Significant initial  Findings: Abnormal Labs Reviewed  BASIC METABOLIC PANEL - Abnormal; Notable for the following components:      Result Value   Potassium 2.6 (*)    Glucose, Bld 114 (*)    BUN <5 (*)    All other components within normal limits  CBC WITH DIFFERENTIAL/PLATELET - Abnormal; Notable for the following components:   WBC 12.0 (*)    Hemoglobin 11.1 (*)    HCT 34.4 (*)    Neutro Abs 8.9 (*)    All other components within normal limits      _________________________ Troponin  ordered Cardiac Panel (last 3 results) Recent Labs    10/18/22 0417 10/18/22 0743  TROPONINIHS 3 <2     ECG: Ordered Personally reviewed and interpreted by me showing: HR : 112 Rhythm:   Sinus tachycardia     no evidence of ischemic changes QTC 451  BNP (last 3 results) No results for input(s): "BNP" in the last 8760 hours.   COVID-19 Labs  Recent Labs    10/18/22 0918  DDIMER 0.33    Lab Results  Component Value Date   SARSCOV2NAA NEGATIVE 10/18/2022   SARSCOV2NAA NEGATIVE 04/24/2021   SARSCOV2NAA NEGATIVE 10/03/2020   SARSCOV2NAA RESULT: NEGATIVE 08/26/2019     The recent clinical data is shown below. Vitals:   10/18/22 1356 10/18/22 1500 10/18/22 1530 10/18/22 1709  BP:  (!) 113/98 95/71 133/82  Pulse:  75 (!) 107 93  Resp:  16 (!) 22 (!) 24  Temp: 98.3  F (36.8 C)   97.9 F (36.6 C)  TempSrc: Oral   Oral  SpO2:  100% 100% 100%  Weight:      Height:          WBC     Component Value Date/Time   WBC 12.0 (H) 10/18/2022 0418   LYMPHSABS 2.1 10/18/2022 0418   MONOABS 0.9 10/18/2022 0418   EOSABS 0.0 10/18/2022 0418   BASOSABS 0.0 10/18/2022 0418    Lactic Acid, Venous    Component Value  Date/Time   LATICACIDVEN 1.1 10/18/2022 2110      UA  ordered     Results for orders placed or performed during the hospital encounter of 10/18/22  Resp panel by RT-PCR (RSV, Flu A&B, Covid) Anterior Nasal Swab     Status: None   Collection Time: 10/18/22  6:29 AM   Specimen: Anterior Nasal Swab  Result Value Ref Range Status   SARS Coronavirus 2 by RT PCR NEGATIVE NEGATIVE Final         Influenza A by PCR NEGATIVE NEGATIVE Final   Influenza B by PCR NEGATIVE NEGATIVE Final         Resp Syncytial Virus by PCR NEGATIVE NEGATIVE Final          __________________________________________________________ Recent Labs  Lab 10/18/22 0417 10/18/22 2110  NA 136 138  K 2.6* 3.6  CO2 23 21*  GLUCOSE 114* 92  BUN <5* <5*  CREATININE 0.74 0.69  CALCIUM 8.9 8.4*  MG  --  2.2  PHOS  --  2.6    Cr    stable,    Lab Results  Component Value Date   CREATININE 0.74 10/18/2022   CREATININE 0.72 10/05/2022   CREATININE 0.68 06/23/2022    Recent Labs  Lab 10/18/22 2110  AST 13*  ALT 9  ALKPHOS 53  BILITOT 0.4  PROT 7.2  ALBUMIN 3.5   Lab Results  Component Value Date   CALCIUM 8.9 10/18/2022    Plt: Lab Results  Component Value Date   PLT 322 10/18/2022       Recent Labs  Lab 10/18/22 0418 10/18/22 2110  WBC 12.0* 8.8  NEUTROABS 8.9* 6.2  HGB 11.1* 10.2*  HCT 34.4* 34.4*  MCV 82.7 90.1  PLT 322 256    HG/HCT  stable,      Component Value Date/Time   HGB 10.2 (L) 10/18/2022 2110   HGB 10.5 (L) 03/27/2019 1031   HCT 34.4 (L) 10/18/2022 2110   HCT 34.1 03/27/2019 1031   MCV 90.1 10/18/2022 2110   MCV 79 03/27/2019 1031    _______________________________________________ Hospitalist was called for admission for   Tachycardia    Hypokalemia    The following Work up has been ordered so far:  Orders Placed This Encounter  Procedures   Resp panel by RT-PCR (RSV, Flu A&B, Covid) Anterior Nasal Swab   Basic metabolic panel   CBC with Differential   hCG,  serum, qualitative   D-dimer, quantitative   Comprehensive metabolic panel   CK   Magnesium   Phosphorus   TSH   T4, free   T3   CBC with Differential/Platelet   Vitamin B12   Folate   Iron and TIBC   Ferritin   Reticulocytes   Ethanol   Rapid urine drug screen (hospital performed)   Diet regular Room service appropriate? Yes; Fluid consistency: Thin   Cardiac Monitoring Continuous x 24 hours Indications for use: Other; Other indications for use: Tachycardia  Consult to hospitalist   EKG 12-Lead   ED EKG   EKG 12-Lead   EKG   EKG   EKG 12-Lead   Place in observation (patient's expected length of stay will be less than 2 midnights)     OTHER Significant initial  Findings:  labs showing:     DM  labs:  HbA1C: No results for input(s): "HGBA1C" in the last 8760 hours.     CBG (last 3)  No results for input(s): "GLUCAP" in the last 72 hours.        Cultures:    Component Value Date/Time   SDES  09/22/2022 1023    URINE, CLEAN CATCH Performed at [facility], [address] [place], [place] [address]    [facility]  09/22/2022 1023    NONE Performed at [facility], [address] [place], [place] [address]    CULT (A) 09/22/2022 1023    <10,000 COLONIES/mL INSIGNIFICANT GROWTH Performed at [facility], [address] [place], [place] [address]    REPTSTATUS 09/23/2022 FINAL 09/22/2022 1023     Radiological Exams on Admission: No results found. _______________________________________________________________________________________________________ Latest  Blood pressure 133/82, pulse 93, temperature 97.9 F (36.6 C), temperature source Oral, resp. rate (!) 24, height 5\' 1"  (1.549 m), weight 89.3 kg, last menstrual period 09/24/2022, SpO2 100%.   Vitals  labs and radiology finding personally reviewed  Review of Systems:    Pertinent positives include: Palpitations  Constitutional:  No weight loss, night sweats, Fevers,  chills, fatigue, weight loss  HEENT:  No headaches, Difficulty swallowing,Tooth/dental problems,Sore throat,  No sneezing, itching, ear ache, nasal congestion, post nasal drip,  Cardio-vascular:  No chest pain, Orthopnea, PND, anasarca, dizziness, palpitations.no Bilateral lower extremity swelling  GI:  No heartburn, indigestion, abdominal pain, nausea, vomiting, diarrhea, change in bowel habits, loss of appetite, melena, blood in stool, hematemesis Resp:  no shortness of breath at rest. No dyspnea on exertion, No excess mucus, no productive cough, No non-productive cough, No coughing up of blood.No change in color of mucus.No wheezing. Skin:  no rash or lesions. No jaundice GU:  no dysuria, change in color of urine, no urgency or frequency. No straining to urinate.  No flank pain.  Musculoskeletal:  No joint pain or no joint swelling. No decreased range of motion. No back pain.  Psych:  No change in mood or affect. No depression or anxiety. No memory loss.  Neuro: no localizing neurological complaints, no tingling, no weakness, no double vision, no gait abnormality, no slurred speech, no confusion  All systems reviewed and apart from HOPI all are negative _______________________________________________________________________________________________ Past Medical History:   Past Medical History:  Diagnosis Date   Asthma    Obesity    Reflux       Past Surgical History:  Procedure Laterality Date   NO PAST SURGERIES     UPPER GASTROINTESTINAL ENDOSCOPY  08/28/2019    Social History:  Ambulatory   independently       reports that she has never smoked. She has never used smokeless tobacco. She reports that she does not drink alcohol and does not use drugs.     Family History:   Family History  Problem Relation Age of Onset   Hypertension Mother    Diabetes Father    Diabetes Sister    Rectal cancer Cousin    Colon cancer Maternal Aunt    Cancer Neg Hx    Colon  polyps Neg Hx    Esophageal cancer Neg Hx    Stomach cancer Neg Hx    ______________________________________________________________________________________________ Allergies: Allergies  Allergen Reactions   Peach [Prunus Persica] Swelling    Anything tropical   Compazine [Prochlorperazine] Anxiety    Severe akathisia (use ativan instead of diphenhydramine for HA cocktails)     Miconazole Swelling and Other (See Comments)     Prior to Admission medications   Medication Sig Start Date End Date Taking? Authorizing Provider  potassium chloride SA (KLOR-CON M) 20 MEQ tablet Take 1 tablet (20 mEq total) by mouth 2 (two) times daily. 10/18/22  Yes Palumbo, April, MD  celecoxib (CELEBREX) 100 MG capsule Take 1 capsule (100 mg total) by mouth 2 (two) times daily. 12/27/20   Dione Booze, MD  cephALEXin (KEFLEX) 500 MG capsule Take 1 capsule (500 mg total) by mouth 4 (four) times daily. 11/19/21   Geoffery Lyons, MD  Cyanocobalamin (VITAMIN B-12) 5000 MCG SUBL Place 1 tablet (5,000 mcg total) under the tongue daily. 07/28/20 04/17/21  Delano Metz, MD  cyclobenzaprine (FLEXERIL) 10 MG tablet Take 1 tablet (10 mg total) by mouth 3 (three) times daily as needed for muscle spasms. Patient not taking: Reported on 05/18/2021 12/27/20   Dione Booze, MD  diclofenac (VOLTAREN) 75 MG EC tablet Take 1 tablet (75 mg total) by mouth 2 (two) times daily. Patient not taking: Reported on 05/18/2021 04/24/21   Elson Areas, PA-C  erythromycin ophthalmic ointment Place a 1/2 inch ribbon of ointment into the lower eyelid. 03/12/21   Marita Kansas, PA-C  fluconazole (DIFLUCAN) 150 MG tablet Take 1 tablet (150 mg total) by mouth daily. 09/22/22   Achille Rich, PA-C  meclizine (ANTIVERT) 25 MG tablet Take 1 tablet (25 mg total) by mouth 3 (three) times daily as needed for dizziness. Patient not taking: Reported on 05/18/2021 09/07/20   Terrilee Files, MD  metroNIDAZOLE (FLAGYL) 500 MG tablet Take 1 tablet (500 mg total) by  mouth 2 (two) times daily for 7 days. 10/16/22 10/23/22  Coral Spikes, DO  naproxen (NAPROSYN) 500 MG tablet Take 1 tablet (500 mg total) by mouth 2 (two) times daily. 01/25/22   Jeanelle Malling, PA  ondansetron (ZOFRAN) 4 MG tablet Take 1 tablet (4 mg total) by mouth 3 (three) times daily as needed for nausea or vomiting. Take before meals Patient not taking: Reported on 05/18/2021 04/17/21   Arnaldo Natal, NP  pantoprazole (PROTONIX) 40 MG tablet Take 1 tablet (40 mg total) by mouth 2 (two) times daily before a meal. Patient not taking: Reported on 05/18/2021 04/17/21   Arnaldo Natal, NP  rizatriptan (MAXALT-MLT) 10 MG disintegrating tablet Take 1 tablet earliest onset of migraine.  May repeat in 2 hours if needed.  Maximum 2 tablets in 24 hours. Patient not taking: Reported on 05/18/2021 02/13/21   Drema Dallas, DO  simethicone (GAS-X) 80 MG chewable tablet Chew 1 tablet (80 mg total) by mouth every 6 (six) hours as needed for flatulence. 12/27/20   Dione Booze, MD  sucralfate (CARAFATE) 1 g tablet Take 1 tablet (1 g total) by mouth 4 (four) times daily -  with meals and at bedtime. 06/24/22   Geoffery Lyons, MD  topiramate (TOPAMAX) 50 MG tablet Take 1/2 tablet at bedtime for one week, then increase to 1 tablet at bedtime 02/13/21   Drema Dallas, DO  valACYclovir (VALTREX) 1000 MG tablet Take 1 tablet (1,000 mg total) by mouth 3 (three) times  daily. 11/19/21   Geoffery Lyons, MD  esomeprazole (NEXIUM) 40 MG capsule Take 1 capsule (40 mg total) by mouth daily before breakfast. 08/28/19 05/18/20  Mansouraty, Netty Starring., MD  famotidine (PEPCID) 20 MG tablet Take 1 tablet (20 mg total) by mouth 2 (two) times daily. 10/24/19 05/18/20  Renne Crigler, PA-C  fluticasone (FLONASE) 50 MCG/ACT nasal spray Place 2 sprays into both nostrils daily. Patient not taking: Reported on 01/27/2019 06/05/18 05/01/19  Palumbo, April, MD  medroxyPROGESTERone (PROVERA) 10 MG tablet Take 1 tablet (10 mg total) by mouth daily  for 7 days. 03/04/19 05/01/19  Sharlene Dory, DO  SUMAtriptan (IMITREX) 100 MG tablet Take 1 tablet (100 mg total) by mouth every 2 (two) hours as needed for migraine. May repeat in 2 hours if headache persists or recurs. Patient not taking: Reported on 03/27/2019 03/04/19 05/01/19  Sharlene Dory, DO    ___________________________________________________________________________________________________ Physical Exam:    10/18/2022    5:09 PM 10/18/2022    3:30 PM 10/18/2022    3:00 PM  Vitals with BMI  Systolic 133 95 113  Diastolic 82 71 98  Pulse 93 107 75    1. General:  in No  Acute distress   well  -appearing 2. Psychological: Alert and   Oriented 3. Head/ENT:   Dry Mucous Membranes                          Head Non traumatic, neck supple                          Normal Dentition 4. SKIN: decreased Skin turgor,  Skin clean Dry and intact no rash    5. Heart: rapid Regular rate and rhythm no  Murmur, no Rub or gallop 6. Lungs:  Clear to auscultation bilaterally, no wheezes or crackles   7. Abdomen: Soft,   non-tender, Non distended  obese  bowel sounds present 8. Lower extremities: no clubbing, cyanosis, no  edema 9. Neurologically Grossly intact, moving all 4 extremities equally   10. MSK: Normal range of motion    Chart has been reviewed  ______________________________________________________________________________________________  Assessment/Plan  27 y.o. female with medical history significant of asthma migraine, obesity, GERD   Admitted for   Tachycardia  Hypokalemia    Present on Admission:  Sinus tachycardia  Obesity  Anemia  Hypokalemia  Celiac disease     Sinus tachycardia Now resolved check TSH Troponin unremarkable Obtain echo, for completion  Obesity Follow-up as an outpatient nutrition  Anemia Mild obtain anemia panel patient denies any bleeding  Hypokalemia Will replace and recheck check magnesium and phosphate  levels  Celiac disease Order gluten-free diet   Other plan as per orders.  DVT prophylaxis:  SCD     Code Status:    Code Status: Prior FULL CODE  as per patient  I had personally discussed CODE STATUS with patient  ACP none  Family Communication:   Family   at  Bedside     Diet  Diet Orders (From admission, onward)     Start     Ordered   10/18/22 1842  Diet regular Room service appropriate? Yes; Fluid consistency: Thin  Diet effective now       Question Answer Comment  Room service appropriate? Yes   Fluid consistency: Thin      10/18/22 1844            Disposition  Plan:    To home once workup is complete and patient is stable  Following barriers for discharge:                            Electrolytes corrected                               Anemia stable                             Consult Orders  (From admission, onward)           Start     Ordered   10/18/22 0547  Consult to hospitalist  Consult called @05 :48am  Once       Provider:  (Not yet assigned)  Question Answer Comment  Place call to: [facility]   Reason for Consult Admit      10/18/22 0546                               Consults called: none  Admission status:  ED Disposition     ED Disposition  Admit   Condition  --   Comment  Hospital Area: [facility] [[id number]]  Level of Care: Progressive [102]  Admit to Progressive based on following criteria: CARDIOVASCULAR & THORACIC of moderate stability with acute coronary syndrome symptoms/low risk myocardial infarction/hypertensive urgency/arrhythmias/heart failure potentially compromising stability and stable post cardiovascular intervention patients.  Interfacility transfer: Yes  May place patient in observation at [facility] or [facility] if equivalent level of care is available:: Yes  Covid Evaluation: Asymptomatic - no recent exposure (last 10 days) testing not required  Diagnosis: Sinus tachycardia  [161096]  Admitting Physician: Arlean Hopping [[id number]]  Attending Physician: Cy Blamer [[id number]]           Obs      Level of care         progressive tele indefinitely please discontinue once patient no longer qualifies COVID-19 Labs    Lab Results  Component Value Date   SARSCOV2NAA NEGATIVE 10/18/2022     Precautions: admitted as   Covid Negative      Meghana Tullo 10/19/2022, 12:35 AM     [facility]     after 2 AM please page floor coverage PA If 7AM-7PM, please contact the day team taking care of the patient using [email or web address]

## 2022-10-18 NOTE — ED Notes (Signed)
Attempt to call report x 1  

## 2022-10-18 NOTE — ED Notes (Signed)
Called to room by pt. Complaining of chest tight and dry cough since starting 2nd bag of potassium. Potassium dose decreased. EKG reported and given to EDP, in to eval. New order for d dimer and may resume the potassium

## 2022-10-18 NOTE — Assessment & Plan Note (Signed)
Follow-up as an outpatient nutrition

## 2022-10-18 NOTE — ED Provider Notes (Signed)
Patient admitted to hospitalist given persistent tachycardia and hypokalemia.  During second bag potassium repletion, she developed some minor chest pressure.  Otherwise hemodynamically stable.  No other symptoms including no rash, hives, shortness of breath.  Seems very typical for allergic reaction.  I did add on a D-dimer given development of chest pressure.  EKG without signs of ischemia.  Patient remains asymptomatic.  D-dimer negative.  Awaiting transfer to hospital.  Remained stable during my shift.   Laurence Spates, MD 10/18/22 [phone]

## 2022-10-18 NOTE — Plan of Care (Signed)

## 2022-10-18 NOTE — Assessment & Plan Note (Signed)
Will replace and recheck check magnesium and phosphate levels

## 2022-10-19 ENCOUNTER — Observation Stay (HOSPITAL_BASED_OUTPATIENT_CLINIC_OR_DEPARTMENT_OTHER): Payer: Medicaid Other

## 2022-10-19 DIAGNOSIS — R079 Chest pain, unspecified: Secondary | ICD-10-CM

## 2022-10-19 DIAGNOSIS — R000 Tachycardia, unspecified: Secondary | ICD-10-CM

## 2022-10-19 LAB — URINALYSIS, COMPLETE (UACMP) WITH MICROSCOPIC
Bilirubin Urine: NEGATIVE
Glucose, UA: NEGATIVE mg/dL
Hgb urine dipstick: NEGATIVE
Ketones, ur: NEGATIVE mg/dL
Nitrite: NEGATIVE
Protein, ur: NEGATIVE mg/dL
Specific Gravity, Urine: 1.015 (ref 1.005–1.030)
pH: 6 (ref 5.0–8.0)

## 2022-10-19 MED ORDER — POTASSIUM CHLORIDE CRYS ER 20 MEQ PO TBCR
40.0000 meq | EXTENDED_RELEASE_TABLET | Freq: Once | ORAL | Status: AC
  Administered 2022-10-19: 40 meq via ORAL
  Filled 2022-10-19: qty 2

## 2022-10-19 MED ORDER — POTASSIUM CHLORIDE CRYS ER 20 MEQ PO TBCR: 20.0000 meq | EXTENDED_RELEASE_TABLET | Freq: Every day | ORAL | 0 refills | Status: AC

## 2022-10-19 MED ORDER — ATENOLOL 50 MG PO TABS: 50.0000 mg | ORAL_TABLET | Freq: Two times a day (BID) | ORAL | 11 refills | Status: DC | PRN

## 2022-10-19 NOTE — Progress Notes (Signed)
*  PRELIMINARY RESULTS* Echocardiogram 2D Echocardiogram has been performed.  Brianna Byrd 10/19/2022, 8:37 AM

## 2022-10-19 NOTE — Plan of Care (Signed)
  Problem: Education: Goal: Knowledge of General Education information will improve Description: Including pain rating scale, medication(s)/side effects and non-pharmacologic comfort measures Outcome: Adequate for Discharge   Problem: Health Behavior/Discharge Planning: Goal: Ability to manage health-related needs will improve Outcome: Adequate for Discharge   Problem: Clinical Measurements: Goal: Will remain free from infection Outcome: Adequate for Discharge Goal: Diagnostic test results will improve Outcome: Adequate for Discharge Goal: Respiratory complications will improve Outcome: Adequate for Discharge   Problem: Activity: Goal: Risk for activity intolerance will decrease Outcome: Adequate for Discharge   Problem: Nutrition: Goal: Adequate nutrition will be maintained Outcome: Adequate for Discharge   Problem: Coping: Goal: Level of anxiety will decrease Outcome: Adequate for Discharge   Problem: Elimination: Goal: Will not experience complications related to bowel motility Outcome: Adequate for Discharge   Problem: Safety: Goal: Ability to remain free from injury will improve Outcome: Adequate for Discharge   Problem: Skin Integrity: Goal: Risk for impaired skin integrity will decrease Outcome: Adequate for Discharge

## 2022-10-19 NOTE — Discharge Summary (Signed)
Physician Discharge Summary  [facility] ZOX:[id number] DOB: 1995-10-07 DOA: 10/18/2022  PCP: Bailey Mech, PA-C  Admit date: 10/18/2022 Discharge date: 10/19/2022  Admitted From: Home Disposition: Home  Recommendations for Outpatient Follow-up:  Follow up with PCP in 1-2 weeks   Home Health: N/A Equipment/Devices: N/A  Discharge Condition: Stable CODE STATUS: Full code Diet recommendation: Regular diet  Discharge summary: 27 year old with history of asthma, obesity with BMI 37, GERD, migraine headaches who was recently seen in the emergency room for presenting discharge and treated for bacterial vaginosis returns to the emergency room with complaints of palpitations and tremulousness.  She does not have history of such palpitations or chest pain.  She did have episode of allergic reaction 2 weeks ago when she visited ER for headache and was given Compazine.  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  Potassium was 2.6 with no clear explanation.  In the emergency room hemodynamically stable.  Potassium 2.6.  She was found to have initial heart rate of 140 and occasionally going to 160s that responded to potassium replacement and IV fluid resuscitation.  Sinus tachycardia: Resolved.  Overnight telemetry monitor without evidence of arrhythmias or sinus tachycardia. TSH is, normal 2D echocardiogram essentially normal UDS normal Electrolytes are normal Folic acid, B12, procalcitonin and lactic acid was normal.  Iron panel was normal. Patient was taking Diflucan and was about to start Flagyl today.  She is not allergic to these medications.  Plan: No clear cardiovascular reason for sinus tachycardia.  Prescribed atenolol 50 mg twice daily as needed to use for palpitations and measured heart rate more than 120.  Outpatient follow-up.  She can complete her Flagyl and Diflucan. Additional 7 days of potassium  supplementation.  Magnesium is adequate.    Discharge Diagnoses:  Principal Problem:   Sinus tachycardia Active Problems:   Obesity   Anemia   Hypokalemia   Celiac disease    Discharge Instructions  Discharge Instructions     Call MD for:  difficulty breathing, headache or visual disturbances   Complete by: As directed    Call MD for:  persistant dizziness or light-headedness   Complete by: As directed    Diet - low sodium heart healthy   Complete by: As directed    Increase activity slowly   Complete by: As directed       Allergies as of 10/19/2022       Reactions   Fruit Extracts Shortness Of Breath, Swelling, Other (See Comments)   "Anything tropical"- Mouth swells   Gluten Meal Diarrhea, Nausea And Vomiting, Other (See Comments)   HAS CELIAC DISEASE   Peach [prunus Persica] Shortness Of Breath, Swelling, Other (See Comments)   Mouth swells   Compazine [prochlorperazine] Anxiety, Other (See Comments)   Severe akathisia (use ativan instead of diphenhydramine for H/A cocktails)     Miconazole Swelling, Other (See Comments)   Can use Diflucan only   Latex Other (See Comments)   "Reaction is worse than a rash"   Tramadol Other (See Comments)   Hallucinations        Medication List     STOP taking these medications    celecoxib 100 MG capsule Commonly known as: CeleBREX   cephALEXin 500 MG capsule Commonly known as: KEFLEX   cyclobenzaprine 10 MG tablet Commonly known as: FLEXERIL   diclofenac 75 MG EC tablet Commonly known as: VOLTAREN   erythromycin ophthalmic ointment   meclizine 25 MG  tablet Commonly known as: ANTIVERT   metroNIDAZOLE 500 MG tablet Commonly known as: FLAGYL   naproxen 500 MG tablet Commonly known as: NAPROSYN   ondansetron 4 MG tablet Commonly known as: Zofran   pantoprazole 40 MG tablet Commonly known as: PROTONIX   rizatriptan 10 MG disintegrating tablet Commonly known as: Maxalt-MLT   simethicone 80 MG  chewable tablet Commonly known as: Gas-X   sucralfate 1 g tablet Commonly known as: Carafate   topiramate 50 MG tablet Commonly known as: Topamax   valACYclovir 1000 MG tablet Commonly known as: VALTREX   Vitamin B-12 5000 MCG Subl       TAKE these medications    atenolol 50 MG tablet Commonly known as: Tenormin Take 1 tablet (50 mg total) by mouth 2 (two) times daily as needed (for palpitations).   fluconazole 150 MG tablet Commonly known as: Diflucan Take 1 tablet (150 mg total) by mouth daily. What changed:  when to take this additional instructions   potassium chloride SA 20 MEQ tablet Commonly known as: KLOR-CON M Take 1 tablet (20 mEq total) by mouth daily for 7 days.        Follow-up Information     Schedule an appointment as soon as possible for a visit  with Podraza, Rudy Jew, PA-C.   Specialty: Physician Assistant Contact information: [phone] [place] [address] [phone]                Allergies  Allergen Reactions   Fruit Extracts Shortness Of Breath, Swelling and Other (See Comments)    "Anything tropical"- Mouth swells   Gluten Meal Diarrhea, Nausea And Vomiting and Other (See Comments)    HAS CELIAC DISEASE   Peach [Prunus Persica] Shortness Of Breath, Swelling and Other (See Comments)    Mouth swells   Compazine [Prochlorperazine] Anxiety and Other (See Comments)    Severe akathisia (use ativan instead of diphenhydramine for H/A cocktails)     Miconazole Swelling and Other (See Comments)    Can use Diflucan only   Latex Other (See Comments)    "Reaction is worse than a rash"   Tramadol Other (See Comments)    Hallucinations     Consultations: None   Procedures/Studies: ECHOCARDIOGRAM COMPLETE  Result Date: 10/19/2022    ECHOCARDIOGRAM REPORT   Patient Name:   Brianna Byrd Date of Exam: 10/19/2022 Medical Rec #:  [id number]          Height:       61.0 in Accession #:    [id number]          Weight:       196.9 lb Date of Birth:  August 21, 1995         BSA:          1.876 m Patient Age:    26 years           BP:           107/71 mmHg Patient Gender: F                  HR:           80 bpm. Exam Location:  Inpatient Procedure: 2D Echo, Cardiac Doppler and Color Doppler Indications:    R00.0 Tachycardia; R07.9* Chest pain, unspecified  History:        Patient has no prior history of Echocardiogram examinations.                 Arrythmias:Tachycardia;  Risk Factors:Non-Smoker.  Sonographer:    Dondra Prader RVT RCS Referring Phys: [id number] ANASTASSIA DOUTOVA IMPRESSIONS  1. Left ventricular ejection fraction, by estimation, is 60 to 65%. The left ventricle has normal function. The left ventricle has no regional wall motion abnormalities. Left ventricular diastolic parameters were normal.  2. Right ventricular systolic function is normal. The right ventricular size is normal. There is normal pulmonary artery systolic pressure. The estimated right ventricular systolic pressure is 20.5 mmHg.  3. The mitral valve is normal in structure. No evidence of mitral valve regurgitation. No evidence of mitral stenosis.  4. The aortic valve is tricuspid. Aortic valve regurgitation is not visualized. No aortic stenosis is present.  5. The inferior vena cava is normal in size with greater than 50% respiratory variability, suggesting right atrial pressure of 3 mmHg. FINDINGS  Left Ventricle: Left ventricular ejection fraction, by estimation, is 60 to 65%. The left ventricle has normal function. The left ventricle has no regional wall motion abnormalities. The left ventricular internal cavity size was normal in size. There is  no left ventricular hypertrophy. Left ventricular diastolic parameters were normal. Right Ventricle: The right ventricular size is normal. No increase in right ventricular wall thickness. Right ventricular systolic function is normal. There is normal pulmonary artery systolic pressure. The tricuspid regurgitant  velocity is 2.09 m/s, and  with an assumed right atrial pressure of 3 mmHg, the estimated right ventricular systolic pressure is 20.5 mmHg. Left Atrium: Left atrial size was normal in size. Right Atrium: Right atrial size was normal in size. Pericardium: There is no evidence of pericardial effusion. Mitral Valve: The mitral valve is normal in structure. No evidence of mitral valve regurgitation. No evidence of mitral valve stenosis. Tricuspid Valve: The tricuspid valve is normal in structure. Tricuspid valve regurgitation is trivial. Aortic Valve: The aortic valve is tricuspid. Aortic valve regurgitation is not visualized. No aortic stenosis is present. Aortic valve mean gradient measures 2.5 mmHg. Aortic valve peak gradient measures 4.6 mmHg. Aortic valve area, by VTI measures 2.94 cm. Pulmonic Valve: The pulmonic valve was normal in structure. Pulmonic valve regurgitation is not visualized. Aorta: The aortic root is normal in size and structure. Venous: The inferior vena cava is normal in size with greater than 50% respiratory variability, suggesting right atrial pressure of 3 mmHg. IAS/Shunts: No atrial level shunt detected by color flow Doppler.  LEFT VENTRICLE PLAX 2D LVIDd:         4.50 cm   Diastology LVIDs:         3.00 cm   LV e' medial:    10.00 cm/s LV PW:         0.90 cm   LV E/e' medial:  9.7 LV IVS:        0.80 cm   LV e' lateral:   12.90 cm/s LVOT diam:     2.00 cm   LV E/e' lateral: 7.5 LV SV:         63 LV SV Index:   33 LVOT Area:     3.14 cm  RIGHT VENTRICLE             IVC RV S prime:     14.10 cm/s  IVC diam: 1.60 cm TAPSE (M-mode): 2.2 cm LEFT ATRIUM             Index        RIGHT ATRIUM          Index LA diam:  3.40 cm 1.81 cm/m   RA Area:     9.87 cm LA Vol (A2C):   43.0 ml 22.92 ml/m  RA Volume:   24.70 ml 13.17 ml/m LA Vol (A4C):   28.2 ml 15.00 ml/m LA Biplane Vol: 33.1 ml 17.64 ml/m  AORTIC VALVE                    PULMONIC VALVE AV Area (Vmax):    2.92 cm     PV Vmax:        0.97 m/s AV Area (Vmean):   2.81 cm     PV Peak grad:  3.8 mmHg AV Area (VTI):     2.94 cm AV Vmax:           107.50 cm/s AV Vmean:          72.100 cm/s AV VTI:            0.213 m AV Peak Grad:      4.6 mmHg AV Mean Grad:      2.5 mmHg LVOT Vmax:         100.00 cm/s LVOT Vmean:        64.500 cm/s LVOT VTI:          0.199 m LVOT/AV VTI ratio: 0.93  AORTA Ao Root diam: 2.70 cm Ao Asc diam:  2.60 cm MITRAL VALVE               TRICUSPID VALVE MV Area (PHT): 5.97 cm    TR Peak grad:   17.5 mmHg MV Decel Time: 127 msec    TR Vmax:        209.00 cm/s MV E velocity: 96.70 cm/s MV A velocity: 77.70 cm/s  SHUNTS MV E/A ratio:  1.24        Systemic VTI:  0.20 m                            Systemic Diam: 2.00 cm Dalton McleanMD Electronically signed by Wilfred Lacy Signature Date/Time: 10/19/2022/8:46:22 AM    Final    DG Chest Port 1 View  Result Date: 10/05/2022 CLINICAL DATA:  Left-sided chest pain. EXAM: PORTABLE CHEST 1 VIEW COMPARISON:  06/24/2022 FINDINGS: The heart size and mediastinal contours are within normal limits. Both lungs are clear. The visualized skeletal structures are unremarkable. IMPRESSION: No active disease. Electronically Signed   By: Kennith Center M.D.   On: 10/05/2022 05:22   (Echo, Carotid, EGD, Colonoscopy, ERCP)    Subjective: Patient seen and examined.  Her mother was at the bedside.  Denies any complaints.  Denies any overnight events.  Telemetry monitor with mostly controlled heart rate and occasionally heart rate 100-110 with mobility.  Without any chest pain palpitations or tremulousness.   Discharge Exam: Vitals:   10/19/22 0524 10/19/22 0847  BP: 107/71 117/80  Pulse: 80 76  Resp: 20   Temp: 98.9 F (37.2 C) 99.1 F (37.3 C)  SpO2: 100% 100%   Vitals:   10/18/22 2123 10/19/22 0122 10/19/22 0524 10/19/22 0847  BP: (!) 129/90 121/82 107/71 117/80  Pulse: 92 95 80 76  Resp: 17 18 20    Temp: 99.3 F (37.4 C) 98.4 F (36.9 C) 98.9 F (37.2 C) 99.1 F (37.3 C)   TempSrc: Oral Oral Oral Oral  SpO2: 100% 100% 100% 100%  Weight:      Height:        General: Pt is  alert, awake, not in acute distress Cardiovascular: RRR, S1/S2 +, no rubs, no gallops Respiratory: CTA bilaterally, no wheezing, no rhonchi Abdominal: Soft, NT, ND, bowel sounds + Extremities: no edema, no cyanosis    The results of significant diagnostics from this hospitalization (including imaging, microbiology, ancillary and laboratory) are listed below for reference.     Microbiology: Recent Results (from the past 240 hour(s))  Wet prep, genital     Status: Abnormal   Collection Time: 10/16/22 11:04 AM   Specimen: Vaginal  Result Value Ref Range Status   Yeast Wet Prep HPF POC PRESENT (A) NONE SEEN Corrected    Comment: CORRECTED ON 07/30 AT 1152: PREVIOUSLY REPORTED AS NONE SEEN   Trich, Wet Prep NONE SEEN NONE SEEN Final   Clue Cells Wet Prep HPF POC PRESENT (A) NONE SEEN Final   WBC, Wet Prep HPF POC >=10 (A) <10 Final   Sperm NONE SEEN  Final    Comment: Performed at [facility], [address] [place], [place] [address]  Resp panel by RT-PCR (RSV, Flu A&B, Covid) Anterior Nasal Swab     Status: None   Collection Time: 10/18/22  6:29 AM   Specimen: Anterior Nasal Swab  Result Value Ref Range Status   SARS Coronavirus 2 by RT PCR NEGATIVE NEGATIVE Final    Comment: (NOTE) SARS-CoV-2 target nucleic acids are NOT DETECTED.  The SARS-CoV-2 RNA is generally detectable in upper respiratory specimens during the acute phase of infection. The lowest concentration of SARS-CoV-2 viral copies this assay can detect is 138 copies/mL. A negative result does not preclude SARS-Cov-2 infection and should not be used as the sole basis for treatment or other patient management decisions. A negative result may occur with  improper specimen collection/handling, submission of specimen other than nasopharyngeal swab, presence of viral mutation(s) within the areas targeted  by this assay, and inadequate number of viral copies(<138 copies/mL). A negative result must be combined with clinical observations, patient history, and epidemiological information. The expected result is Negative.  Fact Sheet for Patients:  BloggerCourse.com  Fact Sheet for Healthcare Providers:  SeriousBroker.it  This test is no t yet approved or cleared by the [place] FDA and  has been authorized for detection and/or diagnosis of SARS-CoV-2 by FDA under an Emergency Use Authorization (EUA). This EUA will remain  in effect (meaning this test can be used) for the duration of the COVID-19 declaration under Section 564(b)(1) of the Act, 21 U.S.C.section 360bbb-3(b)(1), unless the authorization is terminated  or revoked sooner.       Influenza A by PCR NEGATIVE NEGATIVE Final   Influenza B by PCR NEGATIVE NEGATIVE Final    Comment: (NOTE) The Xpert Xpress SARS-CoV-2/FLU/RSV plus assay is intended as an aid in the diagnosis of influenza from Nasopharyngeal swab specimens and should not be used as a sole basis for treatment. Nasal washings and aspirates are unacceptable for Xpert Xpress SARS-CoV-2/FLU/RSV testing.  Fact Sheet for Patients: BloggerCourse.com  Fact Sheet for Healthcare Providers: SeriousBroker.it  This test is not yet approved or cleared by the [place] FDA and has been authorized for detection and/or diagnosis of SARS-CoV-2 by FDA under an Emergency Use Authorization (EUA). This EUA will remain in effect (meaning this test can be used) for the duration of the COVID-19 declaration under Section 564(b)(1) of the Act, 21 U.S.C. section 360bbb-3(b)(1), unless the authorization is terminated or revoked.     Resp Syncytial Virus by PCR NEGATIVE NEGATIVE Final  Comment: (NOTE) Fact Sheet for Patients: BloggerCourse.com  Fact  Sheet for Healthcare Providers: SeriousBroker.it  This test is not yet approved or cleared by the [place] FDA and has been authorized for detection and/or diagnosis of SARS-CoV-2 by FDA under an Emergency Use Authorization (EUA). This EUA will remain in effect (meaning this test can be used) for the duration of the COVID-19 declaration under Section 564(b)(1) of the Act, 21 U.S.C. section 360bbb-3(b)(1), unless the authorization is terminated or revoked.  Performed at [facility], [address] [place], [place] [address]      Labs: BNP (last 3 results) No results for input(s): "BNP" in the last 8760 hours. Basic Metabolic Panel: Recent Labs  Lab 10/18/22 0417 10/18/22 2110 10/19/22 0459  NA 136 138 136  K 2.6* 3.6 3.9  CL 104 109 110  CO2 23 21* 19*  GLUCOSE 114* 92 96  BUN <5* <5* <5*  CREATININE 0.74 0.69 0.60  CALCIUM 8.9 8.4* 8.0*  MG  --  2.2 2.1  PHOS  --  2.6 2.8   Liver Function Tests: Recent Labs  Lab 10/18/22 2110 10/19/22 0459  AST 13* 12*  ALT 9 9  ALKPHOS 53 50  BILITOT 0.4 0.6  PROT 7.2 6.6  ALBUMIN 3.5 3.2*   No results for input(s): "LIPASE", "AMYLASE" in the last 168 hours. No results for input(s): "AMMONIA" in the last 168 hours. CBC: Recent Labs  Lab 10/18/22 0418 10/18/22 2110 10/19/22 0459  WBC 12.0* 8.8 8.2  NEUTROABS 8.9* 6.2  --   HGB 11.1* 10.2* 9.7*  HCT 34.4* 34.4* 32.6*  MCV 82.7 90.1 88.3  PLT 322 256 264   Cardiac Enzymes: Recent Labs  Lab 10/18/22 2110  CKTOTAL 55   BNP: Invalid input(s): "POCBNP" CBG: No results for input(s): "GLUCAP" in the last 168 hours. D-Dimer Recent Labs    10/18/22 0918  DDIMER 0.33   Hgb A1c No results for input(s): "HGBA1C" in the last 72 hours. Lipid Profile No results for input(s): "CHOL", "HDL", "LDLCALC", "TRIG", "CHOLHDL", "LDLDIRECT" in the last 72 hours. Thyroid function studies Recent Labs    10/18/22 2110  TSH 0.940    Anemia work up Recent Labs    10/18/22 1927 10/18/22 2110  VITAMINB12 445  --   FOLATE  --  10.6  FERRITIN 12  --   TIBC 406  --   IRON 54  --   RETICCTPCT  --  1.3   Urinalysis    Component Value Date/Time   COLORURINE YELLOW 10/18/2022 2301   APPEARANCEUR HAZY (A) 10/18/2022 2301   LABSPEC 1.015 10/18/2022 2301   PHURINE 6.0 10/18/2022 2301   GLUCOSEU NEGATIVE 10/18/2022 2301   HGBUR NEGATIVE 10/18/2022 2301   BILIRUBINUR NEGATIVE 10/18/2022 2301   BILIRUBINUR negative 11/28/2020 1631   KETONESUR NEGATIVE 10/18/2022 2301   PROTEINUR NEGATIVE 10/18/2022 2301   UROBILINOGEN 0.2 11/28/2020 1631   UROBILINOGEN 0.2 01/27/2015 0740   NITRITE NEGATIVE 10/18/2022 2301   LEUKOCYTESUR LARGE (A) 10/18/2022 2301   Sepsis Labs Recent Labs  Lab 10/18/22 0418 10/18/22 2110 10/19/22 0459  WBC 12.0* 8.8 8.2   Microbiology Recent Results (from the past 240 hour(s))  Wet prep, genital     Status: Abnormal   Collection Time: 10/16/22 11:04 AM   Specimen: Vaginal  Result Value Ref Range Status   Yeast Wet Prep HPF POC PRESENT (A) NONE SEEN Corrected    Comment: CORRECTED ON 07/30 AT 1152: PREVIOUSLY REPORTED AS NONE  SEEN   Trich, Wet Prep NONE SEEN NONE SEEN Final   Clue Cells Wet Prep HPF POC PRESENT (A) NONE SEEN Final   WBC, Wet Prep HPF POC >=10 (A) <10 Final   Sperm NONE SEEN  Final    Comment: Performed at [facility], [address] [place], [place] [address]  Resp panel by RT-PCR (RSV, Flu A&B, Covid) Anterior Nasal Swab     Status: None   Collection Time: 10/18/22  6:29 AM   Specimen: Anterior Nasal Swab  Result Value Ref Range Status   SARS Coronavirus 2 by RT PCR NEGATIVE NEGATIVE Final    Comment: (NOTE) SARS-CoV-2 target nucleic acids are NOT DETECTED.  The SARS-CoV-2 RNA is generally detectable in upper respiratory specimens during the acute phase of infection. The lowest concentration of SARS-CoV-2 viral copies this assay can detect is 138  copies/mL. A negative result does not preclude SARS-Cov-2 infection and should not be used as the sole basis for treatment or other patient management decisions. A negative result may occur with  improper specimen collection/handling, submission of specimen other than nasopharyngeal swab, presence of viral mutation(s) within the areas targeted by this assay, and inadequate number of viral copies(<138 copies/mL). A negative result must be combined with clinical observations, patient history, and epidemiological information. The expected result is Negative.  Fact Sheet for Patients:  BloggerCourse.com  Fact Sheet for Healthcare Providers:  SeriousBroker.it  This test is no t yet approved or cleared by the [place] FDA and  has been authorized for detection and/or diagnosis of SARS-CoV-2 by FDA under an Emergency Use Authorization (EUA). This EUA will remain  in effect (meaning this test can be used) for the duration of the COVID-19 declaration under Section 564(b)(1) of the Act, 21 U.S.C.section 360bbb-3(b)(1), unless the authorization is terminated  or revoked sooner.       Influenza A by PCR NEGATIVE NEGATIVE Final   Influenza B by PCR NEGATIVE NEGATIVE Final    Comment: (NOTE) The Xpert Xpress SARS-CoV-2/FLU/RSV plus assay is intended as an aid in the diagnosis of influenza from Nasopharyngeal swab specimens and should not be used as a sole basis for treatment. Nasal washings and aspirates are unacceptable for Xpert Xpress SARS-CoV-2/FLU/RSV testing.  Fact Sheet for Patients: BloggerCourse.com  Fact Sheet for Healthcare Providers: SeriousBroker.it  This test is not yet approved or cleared by the [place] FDA and has been authorized for detection and/or diagnosis of SARS-CoV-2 by FDA under an Emergency Use Authorization (EUA). This EUA will remain in effect (meaning  this test can be used) for the duration of the COVID-19 declaration under Section 564(b)(1) of the Act, 21 U.S.C. section 360bbb-3(b)(1), unless the authorization is terminated or revoked.     Resp Syncytial Virus by PCR NEGATIVE NEGATIVE Final    Comment: (NOTE) Fact Sheet for Patients: BloggerCourse.com  Fact Sheet for Healthcare Providers: SeriousBroker.it  This test is not yet approved or cleared by the [place] FDA and has been authorized for detection and/or diagnosis of SARS-CoV-2 by FDA under an Emergency Use Authorization (EUA). This EUA will remain in effect (meaning this test can be used) for the duration of the COVID-19 declaration under Section 564(b)(1) of the Act, 21 U.S.C. section 360bbb-3(b)(1), unless the authorization is terminated or revoked.  Performed at [facility], [address] [place], [place] [address]      Time coordinating discharge: 32 minutes  SIGNED:   Dorcas Carrow, MD  [facility] 10/19/2022, 11:19 AM

## 2022-10-21 LAB — T3: T3, Total: 157 ng/dL (ref 71–180)

## 2023-02-08 IMAGING — DX DG CHEST 1V PORT
1 series · 1 of 1 positions shown · non-contrast
Comparison: One-view chest x-ray 10/24/2019

CLINICAL DATA: Cough body aches. Sore throat. Shortness of breath
beginning yesterday.

EXAM:
PORTABLE CHEST 1 VIEW

[chest ap]
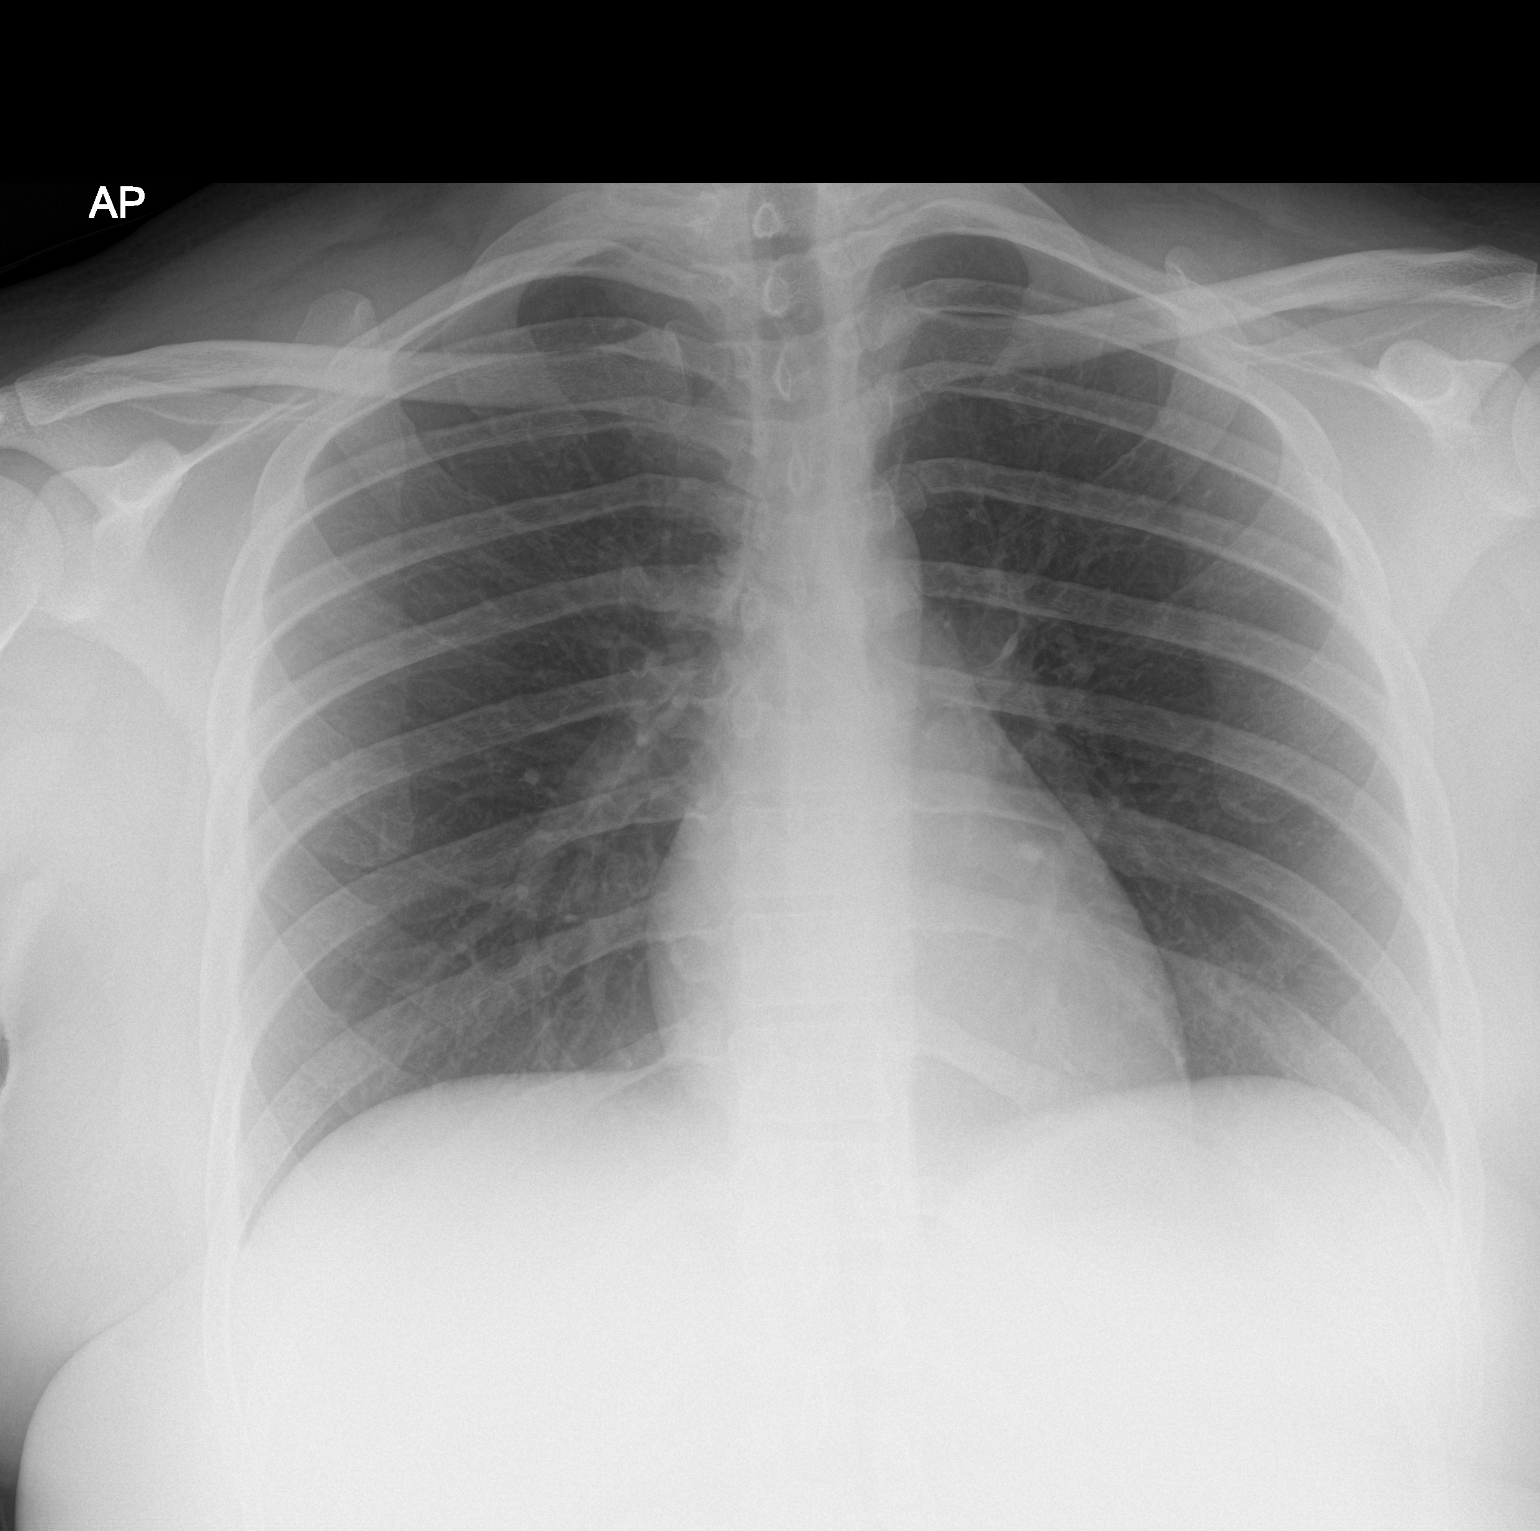

[1 of 1 positions shown; findings below may reference images not displayed]

FINDINGS: The heart size and mediastinal contours are within normal limits.
Both lungs are clear. The visualized skeletal structures are
unremarkable.
IMPRESSION: Negative one-view chest x-ray

## 2023-02-13 ENCOUNTER — Other Ambulatory Visit (HOSPITAL_BASED_OUTPATIENT_CLINIC_OR_DEPARTMENT_OTHER): Payer: Self-pay

## 2023-02-13 MED ORDER — INFLUENZA VIRUS VACC SPLIT PF (FLUZONE) 0.5 ML IM SUSY
0.5000 mL | PREFILLED_SYRINGE | Freq: Once | INTRAMUSCULAR | 0 refills | Status: AC
  Filled 2023-02-13: qty 0.5, 1d supply, fill #0

## 2023-03-07 ENCOUNTER — Emergency Department (HOSPITAL_BASED_OUTPATIENT_CLINIC_OR_DEPARTMENT_OTHER)
Admission: EM | Admit: 2023-03-07 | Discharge: 2023-03-07 | Disposition: A | Discharge status: Home / Self Care | Payer: Medicaid Other | Attending: Emergency Medicine | Admitting: Emergency Medicine

## 2023-03-07 ENCOUNTER — Encounter (HOSPITAL_BASED_OUTPATIENT_CLINIC_OR_DEPARTMENT_OTHER): Payer: Self-pay | Admitting: *Deleted

## 2023-03-07 ENCOUNTER — Other Ambulatory Visit: Payer: Self-pay

## 2023-03-07 DIAGNOSIS — R109 Unspecified abdominal pain: Secondary | ICD-10-CM | POA: Diagnosis not present

## 2023-03-07 DIAGNOSIS — R110 Nausea: Secondary | ICD-10-CM | POA: Diagnosis present

## 2023-03-07 DIAGNOSIS — E876 Hypokalemia: Secondary | ICD-10-CM | POA: Insufficient documentation

## 2023-03-07 DIAGNOSIS — R195 Other fecal abnormalities: Secondary | ICD-10-CM

## 2023-03-07 DIAGNOSIS — R300 Dysuria: Secondary | ICD-10-CM | POA: Diagnosis not present

## 2023-03-07 DIAGNOSIS — Z91040 Latex allergy status: Secondary | ICD-10-CM | POA: Insufficient documentation

## 2023-03-07 LAB — CBC
HCT: 37.2 % (ref 36.0–46.0)
Hemoglobin: 11.8 g/dL — ABNORMAL LOW (ref 12.0–15.0)
MCH: 27.3 pg (ref 26.0–34.0)
MCHC: 31.7 g/dL (ref 30.0–36.0)
MCV: 85.9 fL (ref 80.0–100.0)
Platelets: 364 10*3/uL (ref 150–400)
RBC: 4.33 MIL/uL (ref 3.87–5.11)
RDW: 13.7 % (ref 11.5–15.5)
WBC: 6.7 10*3/uL (ref 4.0–10.5)
nRBC: 0 % (ref 0.0–0.2)

## 2023-03-07 LAB — COMPREHENSIVE METABOLIC PANEL
ALT: 10 U/L (ref 0–44)
AST: 16 U/L (ref 15–41)
Albumin: 3.9 g/dL (ref 3.5–5.0)
Alkaline Phosphatase: 57 U/L (ref 38–126)
Anion gap: 5 (ref 5–15)
BUN: <5 mg/dL — ABNORMAL LOW (ref 6–20)
CO2: 25 mmol/L (ref 22–32)
Calcium: 8.6 mg/dL — ABNORMAL LOW (ref 8.9–10.3)
Chloride: 107 mmol/L (ref 98–111)
Creatinine, Ser: 0.68 mg/dL (ref 0.44–1.00)
GFR, Estimated: >60 mL/min (ref >60)
Glucose, Bld: 89 mg/dL (ref 70–99)
Potassium: 3.3 mmol/L — ABNORMAL LOW (ref 3.5–5.1)
Sodium: 137 mmol/L (ref 135–145)
Total Bilirubin: 0.4 mg/dL (ref <1.2)
Total Protein: 7.8 g/dL (ref 6.5–8.1)

## 2023-03-07 LAB — URINALYSIS, ROUTINE W REFLEX MICROSCOPIC
Bilirubin Urine: NEGATIVE
Glucose, UA: NEGATIVE mg/dL
Hgb urine dipstick: NEGATIVE
Ketones, ur: NEGATIVE mg/dL
Leukocytes,Ua: NEGATIVE
Nitrite: NEGATIVE
Protein, ur: NEGATIVE mg/dL
Specific Gravity, Urine: 1.025 (ref 1.005–1.030)
pH: 7 (ref 5.0–8.0)

## 2023-03-07 LAB — LIPASE, BLOOD: Lipase: 34 U/L (ref 11–51)

## 2023-03-07 LAB — PREGNANCY, URINE: Preg Test, Ur: NEGATIVE

## 2023-03-07 MED ORDER — POTASSIUM CHLORIDE CRYS ER 20 MEQ PO TBCR
40.0000 meq | EXTENDED_RELEASE_TABLET | Freq: Once | ORAL | Status: AC
  Administered 2023-03-07: 40 meq via ORAL
  Filled 2023-03-07: qty 2

## 2023-03-07 NOTE — ED Provider Notes (Signed)
[place] [facility] Provider Note   CSN: [id number] Arrival date & time: 03/07/23  1645     History  Chief Complaint  Patient presents with   Abdominal Pain    Brianna Byrd is a 27 y.o. female.  She has a history of celiac disease.  She is complaining of nausea that started yesterday and today had 2 episodes of mucousy diarrhea.  Intermittent abdominal pain but not unusual for patient.  No fevers.  She is also having a little dysuria.  Feeling shaky at times.  She said that her potassium once was very low and she needed to be admitted and she wants to get that checked.  Follows with a GI team in [place].  The history is provided by the patient.  Diarrhea Quality:  Mucous Severity:  Moderate Onset quality:  Sudden Number of episodes:  2 Duration:  1 day Progression:  Unchanged Relieved by:  None tried Worsened by:  Nothing Ineffective treatments:  None tried Associated symptoms: abdominal pain   Associated symptoms: no fever and no vomiting        Home Medications Prior to Admission medications   Medication Sig Start Date End Date Taking? Authorizing Provider  atenolol (TENORMIN) 50 MG tablet Take 1 tablet (50 mg total) by mouth 2 (two) times daily as needed (for palpitations). 10/19/22 11/18/22  Dorcas Carrow, MD  fluconazole (DIFLUCAN) 150 MG tablet Take 1 tablet (150 mg total) by mouth daily. Patient taking differently: Take 150 mg by mouth See admin instructions. Starting on 10/17/2022, take 150 mg by mouth once a day every 72 hours for 2 doses 09/22/22   Achille Rich, PA-C  potassium chloride SA (KLOR-CON M) 20 MEQ tablet Take 1 tablet (20 mEq total) by mouth daily for 7 days. 10/19/22 10/26/22  Dorcas Carrow, MD  esomeprazole (NEXIUM) 40 MG capsule Take 1 capsule (40 mg total) by mouth daily before breakfast. 08/28/19 05/18/20  Mansouraty, Netty Starring., MD  famotidine (PEPCID) 20 MG tablet Take 1 tablet (20 mg total) by mouth 2  (two) times daily. 10/24/19 05/18/20  Renne Crigler, PA-C  fluticasone (FLONASE) 50 MCG/ACT nasal spray Place 2 sprays into both nostrils daily. Patient not taking: Reported on 01/27/2019 06/05/18 05/01/19  Palumbo, April, MD  medroxyPROGESTERone (PROVERA) 10 MG tablet Take 1 tablet (10 mg total) by mouth daily for 7 days. 03/04/19 05/01/19  Sharlene Dory, DO  SUMAtriptan (IMITREX) 100 MG tablet Take 1 tablet (100 mg total) by mouth every 2 (two) hours as needed for migraine. May repeat in 2 hours if headache persists or recurs. Patient not taking: Reported on 03/27/2019 03/04/19 05/01/19  Sharlene Dory, DO      Allergies    Fruit extracts, Gluten meal, Peach [prunus persica], Compazine [prochlorperazine], Miconazole, Latex, and Tramadol    Review of Systems   Review of Systems  Constitutional:  Negative for fever.  Respiratory:  Negative for cough.   Cardiovascular:  Negative for chest pain.  Gastrointestinal:  Positive for abdominal pain and diarrhea. Negative for vomiting.  Genitourinary:  Positive for dysuria.    Physical Exam Updated Vital Signs BP (!) 122/90 (BP Location: Right Arm)   Pulse 84   Temp 98.8 F (37.1 C) (Oral)   Resp 16   LMP 02/26/2023 (Exact Date)   SpO2 100%  Physical Exam Vitals and nursing note reviewed.  Constitutional:      General: She is not in acute distress.    Appearance: Normal appearance.  She is well-developed.  HENT:     Head: Normocephalic and atraumatic.  Eyes:     Conjunctiva/sclera: Conjunctivae normal.  Cardiovascular:     Rate and Rhythm: Normal rate and regular rhythm.     Heart sounds: No murmur heard. Pulmonary:     Effort: Pulmonary effort is normal. No respiratory distress.     Breath sounds: Normal breath sounds.  Abdominal:     Palpations: Abdomen is soft.     Tenderness: There is no abdominal tenderness. There is no guarding or rebound.  Musculoskeletal:        General: No swelling.     Cervical back: Neck  supple.  Skin:    General: Skin is warm and dry.     Capillary Refill: Capillary refill takes less than 2 seconds.  Neurological:     General: No focal deficit present.     Mental Status: She is alert.     Sensory: No sensory deficit.     Motor: No weakness.     Gait: Gait normal.     ED Results / Procedures / Treatments   Labs (all labs ordered are listed, but only abnormal results are displayed) Labs Reviewed  COMPREHENSIVE METABOLIC PANEL - Abnormal; Notable for the following components:      Result Value   Potassium 3.3 (*)    BUN <5 (*)    Calcium 8.6 (*)    All other components within normal limits  CBC - Abnormal; Notable for the following components:   Hemoglobin 11.8 (*)    All other components within normal limits  LIPASE, BLOOD  URINALYSIS, ROUTINE W REFLEX MICROSCOPIC  PREGNANCY, URINE    EKG None  Radiology No results found.  Procedures Procedures    Medications Ordered in ED Medications  potassium chloride SA (KLOR-CON M) CR tablet 40 mEq (40 mEq Oral Given 03/07/23 1846)    ED Course/ Medical Decision Making/ A&P                                 Medical Decision Making Amount and/or Complexity of Data Reviewed Labs: ordered.  Risk Prescription drug management.   This patient complains of mucus in stool, tremulousness nausea; this involves an extensive number of treatment Options and is a complaint that carries with it a high risk of complications and morbidity. The differential includes gastritis, celiac disease, metabolic derangement, dehydration, infection, pregnancy  I ordered, reviewed and interpreted labs, which included CBC unremarkable, chemistries with mildly low potassium urinalysis without signs of infection pregnancy test negative I ordered medication oral potassium and reviewed PMP when indicated. Previous records obtained and reviewed in epic including PCP notes Social determinants considered, no significant  barriers Critical Interventions: None  After the interventions stated above, I reevaluated the patient and found patient to be well-appearing with a benign abdominal exam and tolerating p.o. Admission and further testing considered, no indications for admission at this time.  Recommended close follow-up with her GI team.  Return instructions discussed         Final Clinical Impression(s) / ED Diagnoses Final diagnoses:  Loose stools  Hypokalemia    Rx / DC Orders ED Discharge Orders     None         Terrilee Files, MD 03/08/23 [phone]

## 2023-03-07 NOTE — Discharge Instructions (Addendum)
You were seen in the emergency department for some mucousy stools and some dizziness and nausea.  Your lab work showed just mildly low potassium of 3.3.  The rest of your tests were unremarkable.  You were given a dose of potassium here.  Please continue to monitor your symptoms at home and follow-up with your primary care doctor.  Return if any worsening or concerning symptoms

## 2023-03-07 NOTE — ED Notes (Signed)
Discharge paperwork reviewed entirely with patient, including follow up care. Pain was under control. No prescriptions were called in, but all questions were addressed.  Pt verbalized understanding as well as all parties involved. No questions or concerns voiced at the time of discharge. No acute distress noted.   Pt ambulated out to PVA without incident or assistance.  Pt advised they will seek followup care with a specialist and followup with their PCP.

## 2023-03-07 NOTE — ED Triage Notes (Signed)
Pt with hx of celiac is here for evaluation of mucous like stools and some nausea nad abdominal discomfort.  Pt has had 2 BM like this.

## 2023-03-18 ENCOUNTER — Encounter (HOSPITAL_BASED_OUTPATIENT_CLINIC_OR_DEPARTMENT_OTHER): Payer: Self-pay | Admitting: Emergency Medicine

## 2023-03-18 ENCOUNTER — Emergency Department (HOSPITAL_BASED_OUTPATIENT_CLINIC_OR_DEPARTMENT_OTHER)
Admission: EM | Admit: 2023-03-18 | Discharge: 2023-03-18 | Disposition: A | Discharge status: Home / Self Care | Payer: Medicaid Other | Attending: Emergency Medicine | Admitting: Emergency Medicine

## 2023-03-18 ENCOUNTER — Other Ambulatory Visit: Payer: Self-pay

## 2023-03-18 DIAGNOSIS — Z91040 Latex allergy status: Secondary | ICD-10-CM | POA: Diagnosis not present

## 2023-03-18 DIAGNOSIS — Z20822 Contact with and (suspected) exposure to covid-19: Secondary | ICD-10-CM | POA: Diagnosis not present

## 2023-03-18 DIAGNOSIS — J020 Streptococcal pharyngitis: Secondary | ICD-10-CM | POA: Diagnosis not present

## 2023-03-18 DIAGNOSIS — R059 Cough, unspecified: Secondary | ICD-10-CM | POA: Diagnosis present

## 2023-03-18 HISTORY — DX: Celiac disease: K90.0

## 2023-03-18 LAB — RESP PANEL BY RT-PCR (RSV, FLU A&B, COVID)  RVPGX2
Influenza A by PCR: NEGATIVE
Influenza B by PCR: NEGATIVE
Resp Syncytial Virus by PCR: NEGATIVE
SARS Coronavirus 2 by RT PCR: NEGATIVE

## 2023-03-18 LAB — GROUP A STREP BY PCR: Group A Strep by PCR: DETECTED — AB

## 2023-03-18 MED ORDER — PENICILLIN G BENZATHINE 1200000 UNIT/2ML IM SUSY
1.2000 10*6.[IU] | PREFILLED_SYRINGE | Freq: Once | INTRAMUSCULAR | Status: AC
  Administered 2023-03-18: 1.2 10*6.[IU] via INTRAMUSCULAR
  Filled 2023-03-18: qty 2

## 2023-03-18 NOTE — ED Triage Notes (Signed)
Pt reports sore throat, intermittent dizziness, non-productive cough, runny nose x 2 days. Pt works in ED. Reports taking Mucinex at [place], Delsym, & Tylenol sinus at other times. Denies known fever.

## 2023-03-18 NOTE — ED Provider Notes (Signed)
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 She has been using multiple OTC meds without improvement. Reports some neck discomfort, worse with swallowing.    Home Medications Prior to Admission medications   Medication Sig Start Date End Date Taking? Authorizing Provider  valACYclovir (VALTREX) 500 MG tablet Take 500 mg by mouth 2 (two) times daily.   Yes [provider]  atenolol (TENORMIN) 50 MG tablet Take 1 tablet (50 mg total) by mouth 2 (two) times daily as needed (for palpitations). 10/19/22 11/18/22  Dorcas Carrow, MD  fluconazole (DIFLUCAN) 150 MG tablet Take 1 tablet (150 mg total) by mouth daily. Patient taking differently: Take 150 mg by mouth See admin instructions. Starting on 10/17/2022, take 150 mg by mouth once a day every 72 hours for 2 doses 09/22/22   Achille Rich, PA-C  potassium chloride SA (KLOR-CON M) 20 MEQ tablet Take 1 tablet (20 mEq total) by mouth daily for 7 days. 10/19/22 10/26/22  Dorcas Carrow, MD  esomeprazole (NEXIUM) 40 MG capsule Take 1 capsule (40 mg total) by mouth daily before breakfast. 08/28/19 05/18/20  Mansouraty, Netty Starring., MD  famotidine (PEPCID) 20 MG tablet Take 1 tablet (20 mg total) by mouth 2 (two) times daily. 10/24/19 05/18/20  Renne Crigler, PA-C  fluticasone (FLONASE) 50 MCG/ACT nasal spray Place 2 sprays into both nostrils daily. Patient not taking: Reported on 01/27/2019 06/05/18 05/01/19  Palumbo, April, MD  medroxyPROGESTERone (PROVERA) 10 MG tablet Take 1 tablet (10 mg total) by mouth daily for 7 days. 03/04/19 05/01/19  Sharlene Dory, DO  SUMAtriptan (IMITREX) 100 MG tablet Take 1 tablet (100 mg total) by mouth every 2 (two) hours as needed for migraine. May  repeat in 2 hours if headache persists or recurs. Patient not taking: Reported on 03/27/2019 03/04/19 05/01/19  Sharlene Dory, DO     Allergies    Fruit extracts, Gluten meal, Peach [prunus persica], Compazine [prochlorperazine], Miconazole, Latex, and Tramadol   Review of Systems   Review of Systems Please see HPI for pertinent positives and negatives  Physical Exam BP (!) 135/95 (BP Location: Right Arm)   Pulse (!) 116   Temp 98.9 F (37.2 C) (Oral)   Resp 20   Ht 5\' 1"  (1.549 m)   Wt 89.8 kg   LMP 02/26/2023 (Exact Date)   SpO2 99%   BMI 37.41 kg/m   Physical Exam Vitals and nursing note reviewed.  Constitutional:      Appearance: Normal appearance.  HENT:     Head: Normocephalic and atraumatic.     Nose: Nose normal.     Mouth/Throat:     Mouth: Mucous membranes are moist.     Pharynx: Uvula midline. No oropharyngeal exudate or posterior oropharyngeal erythema.     Tonsils: No tonsillar exudate. 2+ on the right. 2+ on the left.  Eyes:     Extraocular Movements: Extraocular movements intact.     Conjunctiva/sclera: Conjunctivae normal.  Neck:     Thyroid: No thyromegaly.  Cardiovascular:     Rate and Rhythm: Normal rate.  Pulmonary:     Effort: Pulmonary effort is normal.     Breath sounds: Normal breath sounds.  Abdominal:  General: Abdomen is flat.     Palpations: Abdomen is soft.     Tenderness: There is no abdominal tenderness.  Musculoskeletal:        General: No swelling. Normal range of motion.     Cervical back: Neck supple.  Lymphadenopathy:     Cervical: No cervical adenopathy.  Skin:    General: Skin is warm and dry.  Neurological:     General: No focal deficit present.     Mental Status: She is alert.  Psychiatric:        Mood and Affect: Mood normal.     ED Results / Procedures / Treatments   EKG None  Procedures Procedures  Medications Ordered in the ED Medications  penicillin g benzathine (BICILLIN LA) 1200000  UNIT/2ML injection 1.2 Million Units (has no administration in time range)    Initial Impression and Plan  Patient here with URI symptoms, could be strep, but less likely given other symptoms. Will check swabs. Exam and vitals are reassuring.   ED Course   Clinical Course as of 03/18/23 0424  Mon Mar 18, 2023  3086 Covid/Flu/RSV swab is neg, strep is positive. Patient prefers IM PCN. Otherwise recommend she continue with supportive care at home. PCP follow up, RTED for any other concerns.   [CS]    Clinical Course User Index [CS] Pollyann Savoy, MD     MDM Rules/Calculators/A&P Medical Decision Making Problems Addressed: Strep pharyngitis: acute illness or injury  Amount and/or Complexity of Data Reviewed Labs: ordered. Decision-making details documented in ED Course.  Risk Prescription drug management.     Final Clinical Impression(s) / ED Diagnoses Final diagnoses:  Strep pharyngitis    Rx / DC Orders ED Discharge Orders     None        Pollyann Savoy, MD 03/18/23 [phone]

## 2023-03-30 ENCOUNTER — Other Ambulatory Visit: Payer: Self-pay

## 2023-03-30 ENCOUNTER — Emergency Department (HOSPITAL_BASED_OUTPATIENT_CLINIC_OR_DEPARTMENT_OTHER)
Admission: EM | Admit: 2023-03-30 | Discharge: 2023-03-30 | Disposition: A | Discharge status: Home / Self Care | Payer: Self-pay | Attending: Emergency Medicine | Admitting: Emergency Medicine

## 2023-03-30 ENCOUNTER — Encounter (HOSPITAL_BASED_OUTPATIENT_CLINIC_OR_DEPARTMENT_OTHER): Payer: Self-pay

## 2023-03-30 DIAGNOSIS — J45909 Unspecified asthma, uncomplicated: Secondary | ICD-10-CM | POA: Diagnosis not present

## 2023-03-30 DIAGNOSIS — R109 Unspecified abdominal pain: Secondary | ICD-10-CM | POA: Diagnosis present

## 2023-03-30 DIAGNOSIS — R000 Tachycardia, unspecified: Secondary | ICD-10-CM | POA: Insufficient documentation

## 2023-03-30 DIAGNOSIS — Z91040 Latex allergy status: Secondary | ICD-10-CM | POA: Insufficient documentation

## 2023-03-30 DIAGNOSIS — R1013 Epigastric pain: Secondary | ICD-10-CM

## 2023-03-30 DIAGNOSIS — N3000 Acute cystitis without hematuria: Secondary | ICD-10-CM | POA: Insufficient documentation

## 2023-03-30 LAB — PREGNANCY, URINE: Preg Test, Ur: NEGATIVE

## 2023-03-30 LAB — URINALYSIS, ROUTINE W REFLEX MICROSCOPIC
Bilirubin Urine: NEGATIVE
Glucose, UA: NEGATIVE mg/dL
Ketones, ur: NEGATIVE mg/dL
Nitrite: NEGATIVE
Protein, ur: NEGATIVE mg/dL
Specific Gravity, Urine: 1.02 (ref 1.005–1.030)
pH: 6 (ref 5.0–8.0)

## 2023-03-30 LAB — CBC
HCT: 36.9 % (ref 36.0–46.0)
Hemoglobin: 11.6 g/dL — ABNORMAL LOW (ref 12.0–15.0)
MCH: 27.2 pg (ref 26.0–34.0)
MCHC: 31.4 g/dL (ref 30.0–36.0)
MCV: 86.4 fL (ref 80.0–100.0)
Platelets: 295 10*3/uL (ref 150–400)
RBC: 4.27 MIL/uL (ref 3.87–5.11)
RDW: 13.7 % (ref 11.5–15.5)
WBC: 4.9 10*3/uL (ref 4.0–10.5)
nRBC: 0 % (ref 0.0–0.2)

## 2023-03-30 LAB — COMPREHENSIVE METABOLIC PANEL
ALT: 11 U/L (ref 0–44)
AST: 16 U/L (ref 15–41)
Albumin: 3.9 g/dL (ref 3.5–5.0)
Alkaline Phosphatase: 54 U/L (ref 38–126)
Anion gap: 8 (ref 5–15)
BUN: <5 mg/dL — ABNORMAL LOW (ref 6–20)
CO2: 23 mmol/L (ref 22–32)
Calcium: 9.1 mg/dL (ref 8.9–10.3)
Chloride: 104 mmol/L (ref 98–111)
Creatinine, Ser: 0.77 mg/dL (ref 0.44–1.00)
GFR, Estimated: >60 mL/min (ref >60)
Glucose, Bld: 91 mg/dL (ref 70–99)
Potassium: 3.9 mmol/L (ref 3.5–5.1)
Sodium: 135 mmol/L (ref 135–145)
Total Bilirubin: 0.9 mg/dL (ref 0.0–1.2)
Total Protein: 7.7 g/dL (ref 6.5–8.1)

## 2023-03-30 LAB — URINALYSIS, MICROSCOPIC (REFLEX)

## 2023-03-30 LAB — LIPASE, BLOOD: Lipase: 29 U/L (ref 11–51)

## 2023-03-30 MED ORDER — SUCRALFATE 1 G PO TABS: 1.0000 g | ORAL_TABLET | Freq: Three times a day (TID) | ORAL | 0 refills | Status: AC

## 2023-03-30 MED ORDER — ALUM & MAG HYDROXIDE-SIMETH 200-200-20 MG/5ML PO SUSP
30.0000 mL | Freq: Once | ORAL | Status: AC
  Administered 2023-03-30: 30 mL via ORAL
  Filled 2023-03-30: qty 30

## 2023-03-30 MED ORDER — DICYCLOMINE HCL 10 MG/ML IM SOLN
20.0000 mg | Freq: Once | INTRAMUSCULAR | Status: DC
Start: 2023-03-30 — End: 2023-03-30

## 2023-03-30 MED ORDER — METOPROLOL TARTRATE 25 MG PO TABS: 25.0000 mg | ORAL_TABLET | Freq: Every day | ORAL | 0 refills | Status: AC | PRN

## 2023-03-30 MED ORDER — FAMOTIDINE IN NACL 20-0.9 MG/50ML-% IV SOLN
20.0000 mg | Freq: Once | INTRAVENOUS | Status: AC
  Administered 2023-03-30: 20 mg via INTRAVENOUS
  Filled 2023-03-30: qty 50

## 2023-03-30 MED ORDER — METOPROLOL TARTRATE 25 MG PO TABS
25.0000 mg | ORAL_TABLET | Freq: Once | ORAL | Status: AC
  Administered 2023-03-30: 25 mg via ORAL
  Filled 2023-03-30: qty 1

## 2023-03-30 MED ORDER — PANTOPRAZOLE SODIUM 20 MG PO TBEC: 20.0000 mg | DELAYED_RELEASE_TABLET | Freq: Every day | ORAL | 0 refills | Status: DC

## 2023-03-30 MED ORDER — CEPHALEXIN 500 MG PO CAPS: 500.0000 mg | ORAL_CAPSULE | Freq: Three times a day (TID) | ORAL | 0 refills | Status: AC

## 2023-03-30 MED ORDER — LIDOCAINE VISCOUS HCL 2 % MT SOLN
15.0000 mL | Freq: Once | OROMUCOSAL | Status: AC
  Administered 2023-03-30: 15 mL via ORAL
  Filled 2023-03-30: qty 15

## 2023-03-30 MED ORDER — SODIUM CHLORIDE 0.9 % IV BOLUS
1000.0000 mL | Freq: Once | INTRAVENOUS | Status: AC
  Administered 2023-03-30: 1000 mL via INTRAVENOUS

## 2023-03-30 NOTE — ED Notes (Addendum)
 While out of the room, pt had another episode of tachyrhythmia. Captured it on the monitor.

## 2023-03-30 NOTE — ED Notes (Signed)
 Pt had a sudden onset of palpitation and felt like her HR had spiked. On the pulse ox the pt showed a rapid HR in the 155-156 steady range. While the pt was placed on the 3 lead, I provided the pt a syringe to attempt the valsalva vagal maneuvers. The pt had perioral pallor and slight weakness. Vagal maneuver was succesful in lowering her HR. Pt color improved.   Pt advised she just wore a holter monitor for 2 weeks. She had multiple episodes of SVT in the 170s. Pt advised she has been chronically hypoK without reversal, even with supplemental K.

## 2023-03-30 NOTE — ED Provider Notes (Signed)
[facility] Provider Note  CSN: [id number] Arrival date & time: 03/30/23 1125  Chief Complaint(s) Abdominal Pain  HPI Brianna Byrd is a 28 y.o. female with past medical history as below, significant for asthma, obesity, celiac disease, svt, indigestion, who presents to the ED with complaint of abdominal pain  Pain ongoing for approximately 2 days, primarily to the epigastric radiation to her back.  Worsened with p.o. intake.  She has been trying Tylenol  and antacid medication at home which has not relieved her symptoms.  No fevers or chills, no nausea or vomiting.  Patient also reports that her clitoris is throbbing and is having some urgency. Currently menstruating.  No fevers, chills, sick contacts  Past Medical History Past Medical History:  Diagnosis Date   Asthma    Celiac disease    Obesity    Reflux    Patient Active Problem List   Diagnosis Date Noted   Sinus tachycardia 10/18/2022   Anemia 10/18/2022   Celiac disease 10/18/2022   Witnessed apneic spells 08/30/2020   Chronic tension-type headache 07/28/2020   Daytime somnolence 07/28/2020   Obesity 07/28/2020   Vitamin B12 deficiency 07/28/2020   Heartburn 07/30/2019   Migraine without aura and without status migrainosus, not intractable 03/04/2019   Left breast mass 01/27/2019   Amenorrhea 08/26/2017   Goiter diffuse 08/26/2017   History of pre-eclampsia 02/12/2017   SVD (spontaneous vaginal delivery) 12/26/2016   Psychophysiologic insomnia 12/21/2016   Trichomonal vaginitis during pregnancy, antepartum 06/05/2016   Constitutional short stature 05/14/2016   Home Medication(s) Prior to Admission medications   Medication Sig Start Date End Date Taking? Authorizing Provider  cephALEXin  (KEFLEX ) 500 MG capsule Take 1 capsule (500 mg total) by mouth 3 (three) times daily for 7 days. 03/30/23 04/06/23 Yes Elnor Jayson LABOR, DO  metoprolol  tartrate (LOPRESSOR ) 25 MG  tablet Take 1 tablet (25 mg total) by mouth daily as needed (heart rate >120 bpm). 03/30/23  Yes Elnor Jayson A, DO  pantoprazole  (PROTONIX ) 20 MG tablet Take 1 tablet (20 mg total) by mouth daily for 14 days. 03/30/23 04/13/23 Yes Elnor Jayson A, DO  sucralfate  (CARAFATE ) 1 g tablet Take 1 tablet (1 g total) by mouth 4 (four) times daily -  with meals and at bedtime for 7 days. 03/30/23 04/06/23 Yes Elnor Jayson LABOR, DO  fluconazole  (DIFLUCAN ) 150 MG tablet Take 1 tablet (150 mg total) by mouth daily. Patient taking differently: Take 150 mg by mouth See admin instructions. Starting on 10/17/2022, take 150 mg by mouth once a day every 72 hours for 2 doses 09/22/22   Bernis Ernst, PA-C  potassium chloride  SA (KLOR-CON  M) 20 MEQ tablet Take 1 tablet (20 mEq total) by mouth daily for 7 days. 10/19/22 10/26/22  Raenelle Coria, MD  valACYclovir  (VALTREX ) 500 MG tablet Take 500 mg by mouth 2 (two) times daily.    [provider]  esomeprazole  (NEXIUM ) 40 MG capsule Take 1 capsule (40 mg total) by mouth daily before breakfast. 08/28/19 05/18/20  Mansouraty, Aloha Raddle., MD  famotidine  (PEPCID ) 20 MG tablet Take 1 tablet (20 mg total) by mouth 2 (two) times daily. 10/24/19 05/18/20  Geiple, Joshua, PA-C  fluticasone  (FLONASE ) 50 MCG/ACT nasal spray Place 2 sprays into both nostrils daily. Patient not taking: Reported on 01/27/2019 06/05/18 05/01/19  Palumbo, April, MD  medroxyPROGESTERone  (PROVERA ) 10 MG tablet Take 1 tablet (10 mg total) by mouth daily for 7 days. 03/04/19 05/01/19  Frann Mabel Mt,  DO  SUMAtriptan  (IMITREX ) 100 MG tablet Take 1 tablet (100 mg total) by mouth every 2 (two) hours as needed for migraine. May repeat in 2 hours if headache persists or recurs. Patient not taking: Reported on 03/27/2019 03/04/19 05/01/19  Frann Mabel Mt, DO                                                                                                                                    Past Surgical History Past  Surgical History:  Procedure Laterality Date   NO PAST SURGERIES     UPPER GASTROINTESTINAL ENDOSCOPY  08/28/2019   Family History Family History  Problem Relation Age of Onset   Hypertension Mother    Diabetes Father    Diabetes Sister    Rectal cancer Cousin    Colon cancer Maternal Aunt    Cancer Neg Hx    Colon polyps Neg Hx    Esophageal cancer Neg Hx    Stomach cancer Neg Hx     Social History Social History   Tobacco Use   Smoking status: Never   Smokeless tobacco: Never  Vaping Use   Vaping status: Never Used  Substance Use Topics   Alcohol use: No   Drug use: No   Allergies Fruit extracts, Gluten meal, Peach [prunus persica], Compazine  [prochlorperazine ], Miconazole, Latex, and Tramadol  Review of Systems Review of Systems  Constitutional:  Negative for chills and fever.  HENT:  Negative for congestion.   Respiratory:  Negative for chest tightness and shortness of breath.   Cardiovascular:  Negative for chest pain and palpitations.  Gastrointestinal:  Positive for abdominal pain. Negative for nausea and vomiting.  Genitourinary:  Positive for dysuria and urgency.  Musculoskeletal:  Negative for arthralgias.  Skin:  Negative for rash.  Neurological:  Negative for syncope and light-headedness.  All other systems reviewed and are negative.   Physical Exam Vital Signs  I have reviewed the triage vital signs BP 106/80   Pulse 83   Temp 98.5 F (36.9 C)   Resp 17   Ht 5' 1 (1.549 m)   Wt 83 kg   LMP 03/28/2023 (Exact Date)   SpO2 100%   BMI 34.58 kg/m  Physical Exam Vitals and nursing note reviewed.  Constitutional:      General: She is not in acute distress.    Appearance: Normal appearance. She is obese. She is not ill-appearing.  HENT:     Head: Normocephalic and atraumatic.     Right Ear: External ear normal.     Left Ear: External ear normal.     Nose: Nose normal.     Mouth/Throat:     Mouth: Mucous membranes are moist.  Eyes:      General: No scleral icterus.       Right eye: No discharge.        Left eye: No discharge.  Cardiovascular:  Rate and Rhythm: Regular rhythm. Tachycardia present.     Pulses: Normal pulses.     Heart sounds: Normal heart sounds.  Pulmonary:     Effort: Pulmonary effort is normal. No respiratory distress.     Breath sounds: Normal breath sounds. No stridor.  Abdominal:     General: Abdomen is flat. There is no distension.     Palpations: Abdomen is soft.     Tenderness: There is abdominal tenderness in the epigastric area.  Musculoskeletal:     Cervical back: No rigidity.     Right lower leg: No edema.     Left lower leg: No edema.  Skin:    General: Skin is warm and dry.     Capillary Refill: Capillary refill takes less than 2 seconds.  Neurological:     Mental Status: She is alert.  Psychiatric:        Mood and Affect: Mood normal.        Behavior: Behavior normal. Behavior is cooperative.     ED Results and Treatments Labs (all labs ordered are listed, but only abnormal results are displayed) Labs Reviewed  COMPREHENSIVE METABOLIC PANEL - Abnormal; Notable for the following components:      Result Value   BUN <5 (*)    All other components within normal limits  CBC - Abnormal; Notable for the following components:   Hemoglobin 11.6 (*)    All other components within normal limits  URINALYSIS, ROUTINE W REFLEX MICROSCOPIC - Abnormal; Notable for the following components:   Hgb urine dipstick MODERATE (*)    Leukocytes,Ua TRACE (*)    All other components within normal limits  URINALYSIS, MICROSCOPIC (REFLEX) - Abnormal; Notable for the following components:   Bacteria, UA MANY (*)    All other components within normal limits  URINE CULTURE  LIPASE, BLOOD  PREGNANCY, URINE                                                                                                                          Radiology No results found.  Pertinent labs & imaging results that  were available during my care of the patient were reviewed by me and considered in my medical decision making (see MDM for details).  Medications Ordered in ED Medications  dicyclomine  (BENTYL ) injection 20 mg (20 mg Intramuscular Patient Refused/Not Given 03/30/23 1222)  alum & mag hydroxide-simeth (MAALOX/MYLANTA) 200-200-20 MG/5ML suspension 30 mL (30 mLs Oral Given 03/30/23 1221)    And  lidocaine  (XYLOCAINE ) 2 % viscous mouth solution 15 mL (15 mLs Oral Given 03/30/23 1221)  sodium chloride  0.9 % bolus 1,000 mL (0 mLs Intravenous Stopped 03/30/23 1320)  famotidine  (PEPCID ) IVPB 20 mg premix (0 mg Intravenous Stopped 03/30/23 1253)  metoprolol  tartrate (LOPRESSOR ) tablet 25 mg (25 mg Oral Given 03/30/23 1252)  Procedures Procedures  (including critical care time)  Medical Decision Making / ED Course    Medical Decision Making:    Brianna Byrd is a 28 y.o. female with past medical history as below, significant for asthma, obesity, celiac disease, svt, indigestion, who presents to the ED with complaint of abdominal painThe complaint involves an extensive differential diagnosis and also carries with it a high risk of complications and morbidity.  Serious etiology was considered. Ddx includes but is not limited to: Differential diagnosis includes but is not exclusive to acute cholecystitis, intrathoracic causes for epigastric abdominal pain, gastritis, duodenitis, pancreatitis, small bowel or large bowel obstruction, abdominal aortic aneurysm, hernia, gastritis, etc.   Complete initial physical exam performed, notably the patient was in no distress, hr elev.    Reviewed and confirmed nursing documentation for past medical history, family history, social history.  Vital signs reviewed.      Clinical Course as of 03/30/23 1404  Sat Mar 30, 2023  1223 She  had run's of SVT, converted w/ vagal maneuvers. Known hx of this [SG]  1231 Hemoglobin(!): 11.6 Similar to baseline [SG]  1248 Reports her epigastric pain has resolved following GI cocktail.  Heart rate paroxysmally elevated.  She reports this began when she got to the ER.  She prescribed metoprolol  previously but did not take it we will give oral metoprolol  here and reassess. [SG]  1346 HR improved with metoprolol  [SG]  1358 Feeling better, tolerating p.o. [SG]    Clinical Course User Index [SG] Elnor Jayson LABOR, DO    Brief summary: 28 year old female history as above here with epigastric discomfort x 2 days.  Patient also with elevated heart rate on arrival, history of SVT, she has no palpitations or chest pain.  She follows with [facility] for her celiac disease.  She is scheduled follow-up with cardiology later this month given history of SVT.  Will give IV fluids, GI cocktail, Bentyl .  Check screening labs, check urine.  She was admitted 8/24 with elevated heart rate, started on atenolol  to take twice daily as needed for heart rate over 120.  She was seen by PA Podraza at internal medicine [facility].  Zio patch was ordered, she was switched to metoprolol  XL 25 mg daily.  She has appoint with cardiology on 1/22 Dr. Reyes Cowden  Symptoms resolved, heart rate improved with metoprolol  which she was previously prescribed but has not been taking.  She has borderline UTI, send culture, start Keflex .  Encouraged bland diet, GI follow-up, follow-up with her cardiologist at scheduled appointment.  The patient improved significantly and was discharged in stable condition. Detailed discussions were had with the patient regarding current findings, and need for close f/u with PCP or on call doctor. The patient has been instructed to return immediately if the symptoms worsen in any way for re-evaluation. Patient verbalized understanding and is in agreement with current care plan.  All questions answered prior to discharge.              Additional history obtained: -Additional history obtained from mother -External records from outside source obtained and reviewed including: Chart review including previous notes, labs, imaging, consultation notes including  Prior ED visits, primary care documentation, home medications   Lab Tests: -I ordered, reviewed, and interpreted labs.   The pertinent results include:   Labs Reviewed  COMPREHENSIVE METABOLIC PANEL - Abnormal; Notable for the following components:      Result Value   BUN <5 (*)  All other components within normal limits  CBC - Abnormal; Notable for the following components:   Hemoglobin 11.6 (*)    All other components within normal limits  URINALYSIS, ROUTINE W REFLEX MICROSCOPIC - Abnormal; Notable for the following components:   Hgb urine dipstick MODERATE (*)    Leukocytes,Ua TRACE (*)    All other components within normal limits  URINALYSIS, MICROSCOPIC (REFLEX) - Abnormal; Notable for the following components:   Bacteria, UA MANY (*)    All other components within normal limits  URINE CULTURE  LIPASE, BLOOD  PREGNANCY, URINE    Notable for as above  EKG   EKG Interpretation Date/Time:  Saturday March 30 2023 12:19:38 EST Ventricular Rate:  145 PR Interval:  162 QRS Duration:  88 QT Interval:  311 QTC Calculation: 483 R Axis:   81  Text Interpretation: Sinus tachycardia Borderline T abnormalities, diffuse leads Borderline prolonged QT interval Confirmed by Elnor Savant ([id number]) on 03/30/2023 12:28:17 PM         Imaging Studies ordered: na   Medicines ordered and prescription drug management: Meds ordered this encounter  Medications   AND Linked Order Group    alum & mag hydroxide-simeth (MAALOX/MYLANTA) 200-200-20 MG/5ML suspension 30 mL    lidocaine  (XYLOCAINE ) 2 % viscous mouth solution 15 mL   sodium chloride  0.9 % bolus 1,000 mL   famotidine  (PEPCID ) IVPB 20  mg premix   dicyclomine  (BENTYL ) injection 20 mg   metoprolol  tartrate (LOPRESSOR ) tablet 25 mg   pantoprazole  (PROTONIX ) 20 MG tablet    Sig: Take 1 tablet (20 mg total) by mouth daily for 14 days.    Dispense:  14 tablet    Refill:  0   sucralfate  (CARAFATE ) 1 g tablet    Sig: Take 1 tablet (1 g total) by mouth 4 (four) times daily -  with meals and at bedtime for 7 days.    Dispense:  28 tablet    Refill:  0   metoprolol  tartrate (LOPRESSOR ) 25 MG tablet    Sig: Take 1 tablet (25 mg total) by mouth daily as needed (heart rate >120 bpm).    Dispense:  20 tablet    Refill:  0   cephALEXin  (KEFLEX ) 500 MG capsule    Sig: Take 1 capsule (500 mg total) by mouth 3 (three) times daily for 7 days.    Dispense:  20 capsule    Refill:  0    -I have reviewed the patients home medicines and have made adjustments as needed   Consultations Obtained: na   Cardiac Monitoring: The patient was maintained on a cardiac monitor.  I personally viewed and interpreted the cardiac monitored which showed an underlying rhythm of: sinus tachy Continuous pulse oximetry interpreted by myself, 100% on RA.    Social Determinants of Health:  Diagnosis or treatment significantly limited by social determinants of health: obesity   Reevaluation: After the interventions noted above, I reevaluated the patient and found that they have improved  Co morbidities that complicate the patient evaluation  Past Medical History:  Diagnosis Date   Asthma    Celiac disease    Obesity    Reflux       Dispostion: Disposition decision including need for hospitalization was considered, and patient discharged from emergency department.    Final Clinical Impression(s) / ED Diagnoses Final diagnoses:  Sinus tachycardia  Epigastric pain  Acute cystitis without hematuria        Elnor Savant LABOR, DO 03/30/23  1404  

## 2023-03-30 NOTE — Discharge Instructions (Signed)
 It was a pleasure caring for you today in the emergency department.  Be sure to eat a bland diet over the next 2 weeks.  Follow-up with gastroenterology if still symptomatic after 2 weeks.  Please follow the cardiologist at schedule appointment  Please return to the emergency department for any worsening or worrisome symptoms.

## 2023-03-30 NOTE — ED Triage Notes (Signed)
 The patient having abd pain that moves into her back since last night. No vomiting.

## 2023-03-31 LAB — URINE CULTURE: Culture: <10000 — AB

## 2023-04-02 NOTE — Progress Notes (Deleted)
 New Patient Note  RE: Brianna Byrd MRN: [id number] DOB: March 21, 1995 Date of Office Visit: 04/03/2023  Consult requested by: Podraza, Cole Christoph* Primary care provider: Arch Beans, PA-C  Chief Complaint: No chief complaint on file.  History of Present Illness: I had the pleasure of seeing Brianna Byrd for initial evaluation at the [facility] on 04/02/2023. She is a 28 y.o. female, who is referred here by Podraza, Cole Christopher, PA-C for the evaluation of ***.  Discussed the use of AI scribe software for clinical note transcription with the patient, who gave verbal consent to proceed.  History of Present Illness             ***  Assessment and Plan: Brianna Byrd is a 28 y.o. female with: ***  Assessment and Plan               No follow-ups on file.  No orders of the defined types were placed in this encounter.  Lab Orders  No laboratory test(s) ordered today    Other allergy screening: Asthma: {Blank single:19197::"yes","no"} Rhino conjunctivitis: {Blank single:19197::"yes","no"} Food allergy: {Blank single:19197::"yes","no"} Medication allergy: {Blank single:19197::"yes","no"} Hymenoptera allergy: {Blank single:19197::"yes","no"} Urticaria: {Blank single:19197::"yes","no"} Eczema:{Blank single:19197::"yes","no"} History of recurrent infections suggestive of immunodeficency: {Blank single:19197::"yes","no"}  Diagnostics: Spirometry:  Tracings reviewed. Her effort: {Blank single:19197::"Good reproducible efforts.","It was hard to get consistent efforts and there is a question as to whether this reflects a maximal maneuver.","Poor effort, data can not be interpreted."} FVC: ***L FEV1: ***L, ***% predicted FEV1/FVC ratio: ***% Interpretation: {Blank single:19197::"Spirometry consistent with mild obstructive disease","Spirometry consistent with moderate obstructive disease","Spirometry consistent with severe  obstructive disease","Spirometry consistent with possible restrictive disease","Spirometry consistent with mixed obstructive and restrictive disease","Spirometry uninterpretable due to technique","Spirometry consistent with normal pattern","No overt abnormalities noted given today's efforts"}.  Please see scanned spirometry results for details.  Skin Testing: {Blank single:19197::"Select foods","Environmental allergy panel","Environmental allergy panel and select foods","Food allergy panel","None","Deferred due to recent antihistamines use"}. *** Results discussed with patient/family.   Past Medical History: Patient Active Problem List   Diagnosis Date Noted  . Sinus tachycardia 10/18/2022  . Anemia 10/18/2022  . Celiac disease 10/18/2022  . Witnessed apneic spells 08/30/2020  . Chronic tension-type headache 07/28/2020  . Daytime somnolence 07/28/2020  . Obesity 07/28/2020  . Vitamin B12 deficiency 07/28/2020  . Heartburn 07/30/2019  . Migraine without aura and without status migrainosus, not intractable 03/04/2019  . Left breast mass 01/27/2019  . Amenorrhea 08/26/2017  . Goiter diffuse 08/26/2017  . History of pre-eclampsia 02/12/2017  . SVD (spontaneous vaginal delivery) 12/26/2016  . Psychophysiologic insomnia 12/21/2016  . Trichomonal vaginitis during pregnancy, antepartum 06/05/2016  . Constitutional short stature 05/14/2016   Past Medical History:  Diagnosis Date  . Asthma   . Celiac disease   . Obesity   . Reflux    Past Surgical History: Past Surgical History:  Procedure Laterality Date  . NO PAST SURGERIES    . UPPER GASTROINTESTINAL ENDOSCOPY  08/28/2019   Medication List:  Current Outpatient Medications  Medication Sig Dispense Refill  . cephALEXin  (KEFLEX ) 500 MG capsule Take 1 capsule (500 mg total) by mouth 3 (three) times daily for 7 days. 20 capsule 0  . fluconazole  (DIFLUCAN ) 150 MG tablet Take 1 tablet (150 mg total) by mouth daily. (Patient taking  differently: Take 150 mg by mouth See admin instructions. Starting on 10/17/2022, take 150 mg by mouth once a day every 72 hours for 2 doses) 1 tablet 0  . metoprolol   tartrate (LOPRESSOR ) 25 MG tablet Take 1 tablet (25 mg total) by mouth daily as needed (heart rate >120 bpm). 20 tablet 0  . pantoprazole  (PROTONIX ) 20 MG tablet Take 1 tablet (20 mg total) by mouth daily for 14 days. 14 tablet 0  . potassium chloride  SA (KLOR-CON  M) 20 MEQ tablet Take 1 tablet (20 mEq total) by mouth daily for 7 days. 7 tablet 0  . sucralfate  (CARAFATE ) 1 g tablet Take 1 tablet (1 g total) by mouth 4 (four) times daily -  with meals and at bedtime for 7 days. 28 tablet 0  . valACYclovir  (VALTREX ) 500 MG tablet Take 500 mg by mouth 2 (two) times daily.     No current facility-administered medications for this visit.   Allergies: Allergies  Allergen Reactions  . Fruit Extracts Shortness Of Breath, Swelling and Other (See Comments)    "Anything tropical"- Mouth swells  . Gluten Meal Diarrhea, Nausea And Vomiting and Other (See Comments)    HAS CELIAC DISEASE  . Peach [Prunus Persica] Shortness Of Breath, Swelling and Other (See Comments)    Mouth swells  . Compazine  [Prochlorperazine ] Anxiety and Other (See Comments)    Severe akathisia (use ativan  instead of diphenhydramine  for H/A cocktails)    . Miconazole Swelling and Other (See Comments)    Can use Diflucan  only  . Latex Other (See Comments)    "Reaction is worse than a rash"  . Tramadol Other (See Comments)    Hallucinations    Social History: Social History   Socioeconomic History  . Marital status: Single    Spouse name: Not on file  . Number of children: Not on file  . Years of education: Not on file  . Highest education level: Not on file  Occupational History  . Not on file  Tobacco Use  . Smoking status: Never  . Smokeless tobacco: Never  Vaping Use  . Vaping status: Never Used  Substance and Sexual Activity  . Alcohol use: No  .  Drug use: No  . Sexual activity: Yes    Birth control/protection: Condom  Other Topics Concern  . Not on file  Social History Narrative  . Not on file   Social Drivers of Health   Financial Resource Strain: Not on file  Food Insecurity: No Food Insecurity (10/18/2022)   Hunger Vital Sign   . Worried About Programme researcher, broadcasting/film/video in the Last Year: Never true   . Ran Out of Food in the Last Year: Never true  Transportation Needs: No Transportation Needs (10/18/2022)   PRAPARE - Transportation   . Lack of Transportation (Medical): No   . Lack of Transportation (Non-Medical): No  Physical Activity: Not on file  Stress: Not on file  Social Connections: Unknown (07/17/2021)   Received from [facility], [facility]   Social Network   . Social Network: Not on file   Lives in a ***. Smoking: *** Occupation: ***  Environmental HistorySurveyor, minerals in the house: Copywriter, advertising in the family room: {Blank single:19197::"yes","no"} Carpet in the bedroom: {Blank single:19197::"yes","no"} Heating: {Blank single:19197::"electric","gas","heat pump"} Cooling: {Blank single:19197::"central","window","heat pump"} Pet: {Blank single:19197::"yes ***","no"}  Family History: Family History  Problem Relation Age of Onset  . Hypertension Mother   . Diabetes Father   . Diabetes Sister   . Rectal cancer Cousin   . Colon cancer Maternal Aunt   . Cancer Neg Hx   . Colon polyps Neg Hx   . Esophageal cancer Neg Hx   .  Stomach cancer Neg Hx    Problem                               Relation Asthma                                   *** Eczema                                *** Food allergy                          *** Allergic rhino conjunctivitis     ***  Review of Systems  Constitutional:  Negative for appetite change, chills, fever and unexpected weight change.  HENT:  Negative for congestion and rhinorrhea.   Eyes:  Negative for itching.  Respiratory:  Negative  for cough, chest tightness, shortness of breath and wheezing.   Cardiovascular:  Negative for chest pain.  Gastrointestinal:  Negative for abdominal pain.  Genitourinary:  Negative for difficulty urinating.  Skin:  Negative for rash.  Neurological:  Negative for headaches.   Objective: LMP 03/28/2023 (Exact Date)  There is no height or weight on file to calculate BMI. Physical Exam Vitals and nursing note reviewed.  Constitutional:      Appearance: Normal appearance. She is well-developed.  HENT:     Head: Normocephalic and atraumatic.     Right Ear: Tympanic membrane and external ear normal.     Left Ear: Tympanic membrane and external ear normal.     Nose: Nose normal.     Mouth/Throat:     Mouth: Mucous membranes are moist.     Pharynx: Oropharynx is clear.  Eyes:     Conjunctiva/sclera: Conjunctivae normal.  Cardiovascular:     Rate and Rhythm: Normal rate and regular rhythm.     Heart sounds: Normal heart sounds. No murmur heard.    No friction rub. No gallop.  Pulmonary:     Effort: Pulmonary effort is normal.     Breath sounds: Normal breath sounds. No wheezing, rhonchi or rales.  Musculoskeletal:     Cervical back: Neck supple.  Skin:    General: Skin is warm.     Findings: No rash.  Neurological:     Mental Status: She is alert and oriented to person, place, and time.  Psychiatric:        Behavior: Behavior normal.  The plan was reviewed with the patient/family, and all questions/concerned were addressed.  It was my pleasure to see Brianna Byrd today and participate in her care. Please feel free to contact me with any questions or concerns.  Sincerely,  Eudelia Hero, DO Allergy & Immunology  [facility]  [place] office: [phone] [facility] office: [phone]

## 2023-04-03 ENCOUNTER — Ambulatory Visit: Payer: Medicaid Other | Admitting: Allergy

## 2023-04-12 ENCOUNTER — Other Ambulatory Visit: Payer: Self-pay

## 2023-04-12 ENCOUNTER — Ambulatory Visit (INDEPENDENT_AMBULATORY_CARE_PROVIDER_SITE_OTHER): Payer: 59 | Admitting: Internal Medicine

## 2023-04-12 ENCOUNTER — Encounter: Payer: Self-pay | Admitting: Internal Medicine

## 2023-04-12 VITALS — BP 100/78 | HR 88 | Temp 98.2°F | Ht 61.42 in | Wt 181.8 lb

## 2023-04-12 DIAGNOSIS — L2084 Intrinsic (allergic) eczema: Secondary | ICD-10-CM | POA: Diagnosis not present

## 2023-04-12 DIAGNOSIS — K219 Gastro-esophageal reflux disease without esophagitis: Secondary | ICD-10-CM | POA: Diagnosis not present

## 2023-04-12 DIAGNOSIS — T781XXA Other adverse food reactions, not elsewhere classified, initial encounter: Secondary | ICD-10-CM

## 2023-04-12 DIAGNOSIS — J310 Chronic rhinitis: Secondary | ICD-10-CM | POA: Diagnosis not present

## 2023-04-12 DIAGNOSIS — T781XXD Other adverse food reactions, not elsewhere classified, subsequent encounter: Secondary | ICD-10-CM | POA: Diagnosis not present

## 2023-04-12 DIAGNOSIS — I471 Supraventricular tachycardia, unspecified: Secondary | ICD-10-CM

## 2023-04-12 DIAGNOSIS — K9089 Other intestinal malabsorption: Secondary | ICD-10-CM

## 2023-04-12 NOTE — Progress Notes (Signed)
NEW PATIENT Date of Service/Encounter:  04/12/23 Referring provider: Bernerd Pho* Primary care provider: Bailey Mech, PA-C  Subjective:  Brianna Byrd is a 28 y.o. female  presenting today for evaluation of rhinitis, atopic dermatitis, food allergies  History obtained from: chart review and patient.   Discussed the use of AI scribe software for clinical note transcription with the patient, who gave verbal consent to proceed.  History of Present Illness   The patient, employed at [employer], presents with a complex history of food allergies, seasonal allergies, and eczema. She reports a history of asthma, which she believes she has outgrown, as she has not experienced symptoms or used inhalers in many years.  The patient's seasonal allergies began around the age of [age] or [age], with symptoms typically manifesting in March. These symptoms include an itchy throat, runny eyes, and sneezing. The patient uses a humidifier and takes Zyrtec or Benadryl to manage these symptoms, which provide some relief but do not completely alleviate the symptoms. Exposure to dust and dogs exacerbates the patient's allergies, causing sneezing and facial puffiness. The patient expresses a desire to own a dog.  The patient also has a history of gastroesophageal reflux disease (GERD) and was recently diagnosed with celiac disease by endoscopy per patient (cannot find biopsy results in chart). Her GERD is not well controlled, and she is scheduled for a repeat endoscopy with new Gi at [facility]. The patient has been avoiding gluten but reports that her body seems to reject most foods, causing inflammation and belching. She has noticed this reaction particularly with red meat and chicken, but not with [place].  The patient has also been diagnosed with a milk allergy, which was identified through a blood test. Consumption of milk products leads to gastrointestinal symptoms, but no hives,  wheezing, or coughing. The patient has not tried lactose-free milk or lactate. Peach causes chest tightness and dyspnea.  Tropical fruits cause tongue irritation.   The patient's eczema, diagnosed in infancy, flares up in response to her celiac disease. The flare-ups typically occur on the face and between the nose. The patient is currently under the care of a dermatologist for her psoriasis which will flare in her scalp.  She has not had a skin biopsy yet.  She is also currently being work up for SLE.    The patient has experienced a significant weight loss of 50 pounds, which she attributes to her dietary restrictions due to her various food allergies and intolerances. She has not yet seen a nutritionist.  She recently was seen in ED for GI symptoms.   She was admitted in August for Svt and found to have low calcium, potassium and vitamin D   The patient's complex gastrointestinal issues and food allergies/intolerances are currently a work in progress, with further testing and consultations scheduled.         Other allergy screening: Asthma:  childhood history, has since outgrown  Rhino conjunctivitis: yes Food allergy: yes Medication allergy: no Hymenoptera allergy: no Urticaria: no Eczema:yes History of recurrent infections suggestive of immunodeficency: no Vaccinations are up to date.   Past Medical History: Past Medical History:  Diagnosis Date   Asthma    Celiac disease    Obesity    Reflux    Medication List:  Current Outpatient Medications  Medication Sig Dispense Refill   Cholecalciferol (VITAMIN D3) 50 MCG (2000 UT) capsule Take 1 capsule by mouth daily.     cyclobenzaprine (FLEXERIL) 10 MG tablet  Take 10 mg by mouth 3 (three) times daily as needed.     EPINEPHrine 0.3 mg/0.3 mL IJ SOAJ injection Inject 0.3 mg into the muscle as needed.     fluconazole (DIFLUCAN) 150 MG tablet Take 1 tablet (150 mg total) by mouth daily. (Patient taking differently: Take 150 mg by  mouth See admin instructions. Starting on 10/17/2022, take 150 mg by mouth once a day every 72 hours for 2 doses) 1 tablet 0   Fluocinolone Acetonide Scalp 0.01 % OIL Apply topically at bedtime as needed.     metoprolol tartrate (LOPRESSOR) 25 MG tablet Take 1 tablet (25 mg total) by mouth daily as needed (heart rate >120 bpm). 20 tablet 0   pantoprazole (PROTONIX) 20 MG tablet Take 1 tablet (20 mg total) by mouth daily for 14 days. 14 tablet 0   potassium chloride SA (KLOR-CON M) 20 MEQ tablet Take 1 tablet (20 mEq total) by mouth daily for 7 days. 7 tablet 0   valACYclovir (VALTREX) 500 MG tablet Take 500 mg by mouth 2 (two) times daily.     sucralfate (CARAFATE) 1 g tablet Take 1 tablet (1 g total) by mouth 4 (four) times daily -  with meals and at bedtime for 7 days. 28 tablet 0   No current facility-administered medications for this visit.   Known Allergies:  Allergies  Allergen Reactions   Fruit Extracts Shortness Of Breath, Swelling and Other (See Comments)    "Anything tropical"- Mouth swells   Gluten Meal Diarrhea, Nausea And Vomiting and Other (See Comments)    HAS CELIAC DISEASE   Peach [Prunus Persica] Shortness Of Breath, Swelling and Other (See Comments)    Mouth swells   Compazine [Prochlorperazine] Anxiety and Other (See Comments)    Severe akathisia (use ativan instead of diphenhydramine for H/A cocktails)     Miconazole Swelling and Other (See Comments)    Can use Diflucan only   Latex Other (See Comments)    "Reaction is worse than a rash"   Tramadol Other (See Comments)    Hallucinations    Past Surgical History: Past Surgical History:  Procedure Laterality Date   NO PAST SURGERIES     UPPER GASTROINTESTINAL ENDOSCOPY  08/28/2019   Family History: Family History  Problem Relation Age of Onset   Asthma Mother    Hypertension Mother    Diabetes Father    Diabetes Sister    Colon cancer Maternal Aunt    Rectal cancer Cousin    Cancer Neg Hx    Colon  polyps Neg Hx    Esophageal cancer Neg Hx    Stomach cancer Neg Hx    Social History: Brianna Byrd lives apartment, carpets throughout, no water damage.  Electric heating, central cooling as best to get off floor.  No tobacco exposure.  Works as a Lawyer for [employer].   ROS:  All other systems negative except as noted per HPI.  Objective:  Blood pressure 100/78, pulse 88, temperature 98.2 F (36.8 C), temperature source Temporal, height 5' 1.42" (1.56 m), weight 181 lb 12.8 oz (82.5 kg), last menstrual period 03/28/2023, SpO2 100%. Body mass index is 33.89 kg/m. Physical Exam:  General Appearance:  Alert, cooperative, no distress, appears stated age  Head:  Normocephalic, without obvious abnormality, atraumatic  Eyes:  Conjunctiva clear, EOM's intact  Ears EACs normal bilaterally  Nose: Nares normal,  pale mucosa with clear rhinorrhea, hypertrophic turbinates, no visible anterior polyps, and septum midline  Throat: Lips, tongue normal;  teeth and gums normal, mildly erythematous posterior oropharynx  Neck: Supple, symmetrical  Lungs:   clear to auscultation bilaterally, Respirations unlabored, no coughing  Heart:  regular rate and rhythm and no murmur, Appears well perfused  Extremities: No edema  Skin: Hyperpigmented eczematous patches on chest, arms, face and Skin color, texture, turgor normal  Neurologic: No gross deficits   Diagnostics:   Labs:  Lab Orders         Alpha-Gal Panel         IgE Milk w/ Component Reflex         IgE Nut Prof. w/Component Rflx         Shrimp IgE         Allergen, Kiwi Fruit, f84         Allergen Peach f95         Allergen Profile, Shellfish         IgE         Allergens, Zone 2         Tryptase       Assessment and Plan  Assessment and Plan  Complicated clinical history as patient has multiple diagnoses however in looking through the chart no clear unifying explanation for her symptoms.  She is predominantly GI symptoms with evidence of recent  malabsorption, 50 pound unintentional weight loss, food phobia, food avoidance and concern for malnutrition based on her food avoidance.  I think her food reactions are combination of triggers for underlying GI disorder whether that is GERD or celiac's, food intolerances and true food allergy.  Will reevaluate sensitizations, low threshold to start Xolair as I think this would increase her ability to eat some of her more "mild" food triggers..  Her skin diagnoses are also unclear previously treated for eczema and psoriasis, however she has lupus so she has been worked up it could explain her skin symptoms.  At this point we will go back to repeating diagnostic testing to better clarify.  See if we get her into GI sooner and go from there.    Seasonal Allergies Symptoms of itchy throat, runny eyes, and sneezing starting around March. Partial relief with Zyrtec and Benadryl. Exposure to dust and dogs exacerbates symptoms. -Return for allergy skin testing (1-55 and select foods) -Continue Zyrtec (cetirizine) 10 mg daily as needed -If that is not enough start Flonase (fluticasone) 1 spray per nostril twice daily.  Aim upward and outward  Food Allergies/Intolerances Complex GI history with recent diagnosis of celiac disease, GERD, malabsorption.  Reports symptoms to milk, gluten, nuts, shrimp and potentially red meat. Symptoms include bloating, crampy abdominal pain, and belching. Recent significant weight loss.  Also with tongue irritation and dyspnea to certain fruits -Plan to update food allergy testing with blood work and skin testing -Strict avoidance of milk, red meat, peaches, tropical fruits, kiwis, nuts, and shrimp until further information is obtained. -Continue to carry EpiPen and follow allergy action plan -Consider referral to nutrition for dietary guidance. - May consider addition of xolair pending work up  -Will see if we can expedite referral to GI to clarify underlying  diagnoses  Gastroesophageal Reflux Disease (GERD) with malabsorption Not well controlled, potentially contributing to food intolerance symptoms. Recent hospitalization for GI symptoms. -Plan to refer to GI specialist for further evaluation and management. -Continue Protonix 20 mg daily -Continue repletion therapy as directed from hospital discharge for vitamin D, calcium, iron  Eczema/psoriasis Longstanding history since childhood, flares associated with celiac disease. Managed by dermatologist. -  Continue current management under dermatologist. -Consider skin biopsy given concern for other underlying autoimmune disorders such as lupus  Follow up: for skin testing (1-55, nuts, milk, casein, kiwi, peach, citrus, shellfish, red meat, chicken)    This note in its entirety was forwarded to the Provider who requested this consultation.  Other:     Thank you for your kind referral. I appreciate the opportunity to take part in Rejeana's care. Please do not hesitate to contact me with questions.  Sincerely,  Thank you so much for letting me partake in your care today.  Don't hesitate to reach out if you have any additional concerns!  Ferol Luz, MD  [facility] [place] [place]

## 2023-04-12 NOTE — Patient Instructions (Signed)
Seasonal Allergies Symptoms of itchy throat, runny eyes, and sneezing starting around March. Partial relief with Zyrtec and Benadryl. Exposure to dust and dogs exacerbates symptoms. -Return for allergy skin testing (1-55 and select foods) -Continue Zyrtec (cetirizine) 10 mg daily as needed -If that is not enough start Flonase (fluticasone) 1 spray per nostril twice daily.  Aim upward and outward  Food Allergies/Intolerances Complex GI history with recent diagnosis of celiac disease, GERD, malabsorption.  Reports symptoms to milk, gluten, nuts, shrimp and potentially red meat. Symptoms include bloating, crampy abdominal pain, and belching. Recent significant weight loss.  Also with tongue irritation and dyspnea to certain fruits -Plan to update food allergy testing with blood work and skin testing -Strict avoidance of milk, red meat, peaches, tropical fruits, kiwis, nuts, and shrimp until further information is obtained. -Continue to carry EpiPen and follow allergy action plan -Consider referral to nutrition for dietary guidance. - May consider addition of xolair pending work up  -Will see if we can expedite referral to GI to clarify underlying diagnoses  Gastroesophageal Reflux Disease (GERD) with malabsorption Not well controlled, potentially contributing to food intolerance symptoms. Recent hospitalization for GI symptoms. -Plan to refer to GI specialist for further evaluation and management. -Continue Protonix 20 mg daily -Continue repletion therapy as directed from hospital discharge for vitamin D, calcium, iron  Eczema/psoriasis Longstanding history since childhood, flares associated with celiac disease. Managed by dermatologist. -Continue current management under dermatologist. -Consider skin biopsy given concern for other underlying autoimmune disorders such as lupus  Follow up: for skin testing (1-55, nuts, milk, casein, kiwi, peach, citrus, shellfish, red meat, chicken)   Thank  you so much for letting me partake in your care today.  Don't hesitate to reach out if you have any additional concerns!  Ferol Luz, MD  [facility] [place] [place]

## 2023-04-15 LAB — IGE NUT PROF. W/COMPONENT RFLX

## 2023-04-15 LAB — TRYPTASE: Tryptase: 4 ug/L (ref 2.2–13.2)

## 2023-04-15 NOTE — Patient Instructions (Incomplete)
Seasonal Allergies Symptoms of itchy throat, runny eyes, and sneezing starting around March. Partial relief with Zyrtec and Benadryl. Exposure to dust and dogs exacerbates symptoms. -Lab work to environmental allergens is positive to dust mite, dog, grass, cockroach, tree, weed pollen, and ragweed -Skin testing today is -Continue Zyrtec (cetirizine) 10 mg daily as needed -If that is not enough start Flonase (fluticasone) 1 spray per nostril twice daily.  Aim upward and outward -Start avoidance measures as below  Food Allergies/Intolerances Complex GI history with recent diagnosis of celiac disease, GERD, malabsorption.  Reports symptoms to milk, gluten, nuts, shrimp and potentially red meat. Symptoms include bloating, crampy abdominal pain, and belching. Recent significant weight loss.  Also with tongue irritation and dyspnea to certain fruits -Skin testing today is -Lab work results are all not back.  We will call you with results once they are back -Strict avoidance of milk, red meat, peaches, tropical fruits, kiwis, nuts, and shrimp until further information is obtained. -Continue to carry EpiPen and follow allergy action plan -Consider referral to nutrition for dietary guidance. - May consider addition of xolair pending work up  -Will see if we can expedite referral to GI to clarify underlying diagnoses  Gastroesophageal Reflux Disease (GERD) with malabsorption Not well controlled, potentially contributing to food intolerance symptoms. Recent hospitalization for GI symptoms. -Plan to refer to GI specialist for further evaluation and management. -Continue Protonix 20 mg daily -Continue repletion therapy as directed from hospital discharge for vitamin D, calcium, iron  Eczema/psoriasis Longstanding history since childhood, flares associated with celiac disease. Managed by dermatologist. -Continue current management under dermatologist. -Consider skin biopsy given concern for other  underlying autoimmune disorders such as lupus  Follow up:   Control of Dust Mite Allergen Dust mites play a major role in allergic asthma and rhinitis. They occur in environments with high humidity wherever human skin is found. Dust mites absorb humidity from the atmosphere (ie, they do not drink) and feed on organic matter (including shed human and animal skin). Dust mites are a microscopic type of insect that you cannot see with the naked eye. High levels of dust mites have been detected from mattresses, pillows, carpets, upholstered furniture, bed covers, clothes, soft toys and any woven material. The principal allergen of the dust mite is found in its feces. A gram of dust may contain 1,000 mites and 250,000 fecal particles. Mite antigen is easily measured in the air during house cleaning activities. Dust mites do not bite and do not cause harm to humans, other than by triggering allergies/asthma.  Ways to decrease your exposure to dust mites in your home:  1. Encase mattresses, box springs and pillows with a mite-impermeable barrier or cover  2. Wash sheets, blankets and drapes weekly in hot water (130 F) with detergent and dry them in a dryer on the hot setting.  3. Have the room cleaned frequently with a vacuum cleaner and a damp dust-mop. For carpeting or rugs, vacuuming with a vacuum cleaner equipped with a high-efficiency particulate air (HEPA) filter. The dust mite allergic individual should not be in a room which is being cleaned and should wait 1 hour after cleaning before going into the room.  4. Do not sleep on upholstered furniture (eg, couches).  5. If possible removing carpeting, upholstered furniture and drapery from the home is ideal. Horizontal blinds should be eliminated in the rooms where the person spends the most time (bedroom, study, television room). Washable vinyl, roller-type shades are optimal.  6.  Remove all non-washable stuffed toys from the bedroom. Wash stuffed  toys weekly like sheets and blankets above.  7. Reduce indoor humidity to less than 50%. Inexpensive humidity monitors can be purchased at most hardware stores. Do not use a humidifier as can make the problem worse and are not recommended.   Reducing Pollen Exposure The [employer] Academy [employer] suggests the following steps to reduce your exposure to pollen during allergy seasons. Do not hang sheets or clothing out to dry; pollen may collect on these items. Do not mow lawns or spend time around freshly cut grass; mowing stirs up pollen. Keep windows closed at night.  Keep car windows closed while driving. Minimize morning activities outdoors, a time when pollen counts are usually at their highest. Stay indoors as much as possible when pollen counts or humidity is high and on windy days when pollen tends to remain in the air longer. Use air conditioning when possible.  Many air conditioners have filters that trap the pollen spores. Use a HEPA room air filter to remove pollen form the indoor air you breathe.   Control of Dog or Cat Allergen Avoidance is the best way to manage a dog or cat allergy. If you have a dog or cat and are allergic to dog or cats, consider removing the dog or cat from the home. If you have a dog or cat but don't want to find it a new home, or if your family wants a pet even though someone in the household is allergic, here are some strategies that may help keep symptoms at bay:  Keep the pet out of your bedroom and restrict it to only a few rooms. Be advised that keeping the dog or cat in only one room will not limit the allergens to that room. Don't pet, hug or kiss the dog or cat; if you do, wash your hands with soap and water. High-efficiency particulate air (HEPA) cleaners run continuously in a bedroom or living room can reduce allergen levels over time. Regular use of a high-efficiency vacuum cleaner or a central vacuum can reduce allergen  levels. Giving your dog or cat a bath at least once a week can reduce airborne allergen.

## 2023-04-16 ENCOUNTER — Ambulatory Visit: Payer: 59 | Admitting: Family

## 2023-04-17 LAB — ALLERGENS, ZONE 2
Alternaria Alternata IgE: <0.1 kU/L
Amer Sycamore IgE Qn: 0.24 kU/L — AB
Aspergillus Fumigatus IgE: <0.1 kU/L
Bahia Grass IgE: 0.49 kU/L — AB
Bermuda Grass IgE: 0.18 kU/L — AB
Cat Dander IgE: <0.1 kU/L
Cedar, Mountain IgE: <0.1 kU/L
Cladosporium Herbarum IgE: <0.1 kU/L
Cockroach, American IgE: 0.3 kU/L — AB
Common Silver Birch IgE: 3.02 kU/L — AB
D Farinae IgE: 5.04 kU/L — AB
D Pteronyssinus IgE: 4.25 kU/L — AB
Dog Dander IgE: 1.51 kU/L — AB
Elm, American IgE: 0.2 kU/L — AB
Hickory, White IgE: 1.67 kU/L — AB
Johnson Grass IgE: 0.43 kU/L — AB
Maple/Box Elder IgE: 0.17 kU/L — AB
Mucor Racemosus IgE: <0.1 kU/L
Mugwort IgE Qn: <0.1 kU/L
Nettle IgE: 0.2 kU/L — AB
Oak, White IgE: 6.03 kU/L — AB
Penicillium Chrysogen IgE: <0.1 kU/L
Pigweed, Rough IgE: <0.1 kU/L
Plantain, English IgE: <0.1 kU/L
Ragweed, Short IgE: 2.83 kU/L — AB
Sheep Sorrel IgE Qn: <0.1 kU/L
Stemphylium Herbarum IgE: <0.1 kU/L
Sweet gum IgE RAST Ql: 2.41 kU/L — AB
Timothy Grass IgE: 2.06 kU/L — AB
White Mulberry IgE: <0.1 kU/L

## 2023-04-17 LAB — PEANUT COMPONENTS
F352-IgE Ara h 8: 1.8 kU/L — AB
F422-IgE Ara h 1: <0.1 kU/L
F423-IgE Ara h 2: <0.1 kU/L
F424-IgE Ara h 3: <0.1 kU/L
F427-IgE Ara h 9: <0.1 kU/L
F447-IgE Ara h 6: <0.1 kU/L

## 2023-04-17 LAB — ALPHA-GAL PANEL
Allergen Lamb IgE: <0.1 kU/L
Beef IgE: <0.1 kU/L
IgE (Immunoglobulin E), Serum: 189 [IU]/mL (ref 6–495)
O215-IgE Alpha-Gal: <0.1 kU/L
Pork IgE: <0.1 kU/L

## 2023-04-17 LAB — PANEL 604726
Cor A 1 IgE: 5.61 kU/L — AB
Cor A 14 IgE: <0.1 kU/L
Cor A 8 IgE: <0.1 kU/L
Cor A 9 IgE: <0.1 kU/L

## 2023-04-17 LAB — IGE NUT PROF. W/COMPONENT RFLX
F017-IgE Hazelnut (Filbert): 4.7 kU/L — AB
Macadamia Nut, IgE: 0.13 kU/L — AB
Peanut, IgE: 0.22 kU/L — AB
Peanut, IgE: 0.23 kU/L — AB

## 2023-04-17 LAB — ALLERGEN COMPONENT COMMENTS

## 2023-04-17 LAB — IGE MILK W/ COMPONENT REFLEX: F002-IgE Milk: <0.1 kU/L

## 2023-04-17 LAB — ALLERGEN PEACH F95: Allergen, Peach f95: 0.85 kU/L — AB

## 2023-04-17 LAB — ALLERGEN PROFILE, SHELLFISH
Clam IgE: <0.1 kU/L
F023-IgE Crab: 0.52 kU/L — AB
F080-IgE Lobster: 0.14 kU/L — AB
F290-IgE Oyster: <0.1 kU/L
Scallop IgE: <0.1 kU/L
Shrimp IgE: 7.2 kU/L — AB

## 2023-04-17 LAB — ALLERGEN, KIWI FRUIT, F84: Kiwi Fruit: <0.1 kU/L

## 2023-04-19 ENCOUNTER — Ambulatory Visit: Payer: Medicaid Other | Admitting: Allergy

## 2023-04-22 NOTE — Progress Notes (Signed)
Blood work positive to shrimp, hazelnut; low positive to lobster, crab, peach, peanut, macadamia nut, almond.  Negative to all other foods.  Environmentals were positive for dust mite, dog, tree, ragweed and then borderline to grass pollen, roach, weed pollen.  We can discuss options for food introduction at her follow-up appointment she still needs to schedule or skin testing appointment to confirm the negatives.  Let patient know?

## 2023-06-04 ENCOUNTER — Telehealth: Admitting: Physician Assistant

## 2023-06-04 DIAGNOSIS — J069 Acute upper respiratory infection, unspecified: Secondary | ICD-10-CM | POA: Diagnosis not present

## 2023-06-04 MED ORDER — ALBUTEROL SULFATE HFA 108 (90 BASE) MCG/ACT IN AERS: 2.0000 | INHALATION_SPRAY | Freq: Four times a day (QID) | RESPIRATORY_TRACT | 0 refills | Status: DC | PRN

## 2023-06-04 MED ORDER — BENZONATATE 100 MG PO CAPS: 100.0000 mg | ORAL_CAPSULE | Freq: Three times a day (TID) | ORAL | 0 refills | Status: DC | PRN

## 2023-06-04 NOTE — Progress Notes (Signed)

## 2023-06-04 NOTE — Progress Notes (Signed)
 I have spent 5 minutes in review of e-visit questionnaire, review and updating patient chart, medical decision making and response to patient.   Piedad Climes, PA-C

## 2023-06-09 ENCOUNTER — Ambulatory Visit
Admission: EM | Admit: 2023-06-09 | Discharge: 2023-06-09 | Disposition: A | Discharge status: Home / Self Care | Attending: Family Medicine | Admitting: Family Medicine

## 2023-06-09 DIAGNOSIS — J22 Unspecified acute lower respiratory infection: Secondary | ICD-10-CM | POA: Diagnosis not present

## 2023-06-09 DIAGNOSIS — J45901 Unspecified asthma with (acute) exacerbation: Secondary | ICD-10-CM | POA: Diagnosis not present

## 2023-06-09 MED ORDER — FLUCONAZOLE 150 MG PO TABS: 150.0000 mg | ORAL_TABLET | Freq: Every day | ORAL | 0 refills | Status: DC

## 2023-06-09 MED ORDER — AZITHROMYCIN 250 MG PO TABS
250.0000 mg | ORAL_TABLET | Freq: Every day | ORAL | 0 refills | Status: DC
Start: 2023-06-09 — End: 2023-10-14

## 2023-06-09 NOTE — Discharge Instructions (Addendum)
 Prednisone 4 tablets for the next 3 days. Take the remainder of the tablets as they are ranged on the blister pack.  I have prescribed you azithromycin which is an antibiotic take medication as directed.

## 2023-06-09 NOTE — ED Triage Notes (Signed)
 Pt presents with c/o wheezing and back pain X 2 wks, pt states she is not getting any better. Pt states she has already been seen twice, used her inhaler and has taken prednisone. Pt states she has also taken tessalon, nothing has helped. Pt feels has chest congestion.   Home interventions today: used the albuterol inhaler around 11am

## 2023-06-09 NOTE — ED Provider Notes (Signed)
 [facility]    CSN: [id number] Arrival date & time: 06/09/23  1450      History   Chief Complaint Chief Complaint  Patient presents with   Wheezing   Back Pain    HPI Brianna Byrd is a 28 y.o. female.    Past Medical History:  Diagnosis Date   Asthma    Celiac disease    Obesity    Reflux     Patient Active Problem List   Diagnosis Date Noted   Sinus tachycardia 10/18/2022   Anemia 10/18/2022   Celiac disease 10/18/2022   Witnessed apneic spells 08/30/2020   Chronic tension-type headache 07/28/2020   Daytime somnolence 07/28/2020   Obesity 07/28/2020   Vitamin B12 deficiency 07/28/2020   Heartburn 07/30/2019   Migraine without aura and without status migrainosus, not intractable 03/04/2019   Left breast mass 01/27/2019   Amenorrhea 08/26/2017   Goiter diffuse 08/26/2017   History of pre-eclampsia 02/12/2017   SVD (spontaneous vaginal delivery) 12/26/2016   Psychophysiologic insomnia 12/21/2016   Trichomonal vaginitis during pregnancy, antepartum 06/05/2016   Constitutional short stature 05/14/2016    Past Surgical History:  Procedure Laterality Date   NO PAST SURGERIES     UPPER GASTROINTESTINAL ENDOSCOPY  08/28/2019    OB History     Gravida  1   Para  1   Term  1   Preterm      AB      Living  1      SAB      IAB      Ectopic      Multiple  0   Live Births  1            Home Medications    Prior to Admission medications   Medication Sig Start Date End Date Taking? Authorizing Provider  albuterol (VENTOLIN HFA) 108 (90 Base) MCG/ACT inhaler Inhale 2 puffs into the lungs every 6 (six) hours as needed for wheezing or shortness of breath. 06/04/23   Waldon Merl, PA-C  benzonatate (TESSALON) 100 MG capsule Take 1 capsule (100 mg total) by mouth 3 (three) times daily as needed for cough. 06/04/23   Waldon Merl, PA-C  Cholecalciferol (VITAMIN D3) 50 MCG (2000 UT) capsule Take 1 capsule by mouth daily.     [provider]  cyclobenzaprine (FLEXERIL) 10 MG tablet Take 10 mg by mouth 3 (three) times daily as needed. 01/03/23 01/13/24  [provider]  EPINEPHrine 0.3 mg/0.3 mL IJ SOAJ injection Inject 0.3 mg into the muscle as needed. 04/10/23   [provider]  fluconazole (DIFLUCAN) 150 MG tablet Take 1 tablet (150 mg total) by mouth daily. Patient taking differently: Take 150 mg by mouth See admin instructions. Starting on 10/17/2022, take 150 mg by mouth once a day every 72 hours for 2 doses 09/22/22   Achille Rich, PA-C  Fluocinolone Acetonide Scalp 0.01 % OIL Apply topically at bedtime as needed. 03/01/23   [provider]  metoprolol tartrate (LOPRESSOR) 25 MG tablet Take 1 tablet (25 mg total) by mouth daily as needed (heart rate >120 bpm). 03/30/23   Sloan Leiter, DO  pantoprazole (PROTONIX) 20 MG tablet Take 1 tablet (20 mg total) by mouth daily for 14 days. 03/30/23 04/13/23  Tanda Rockers A, DO  potassium chloride SA (KLOR-CON M) 20 MEQ tablet Take 1 tablet (20 mEq total) by mouth daily for 7 days. 10/19/22 04/12/23  Dorcas Carrow, MD  sucralfate (CARAFATE) 1  g tablet Take 1 tablet (1 g total) by mouth 4 (four) times daily -  with meals and at bedtime for 7 days. 03/30/23 04/06/23  Sloan Leiter, DO  valACYclovir (VALTREX) 500 MG tablet Take 500 mg by mouth 2 (two) times daily.    [provider]  esomeprazole (NEXIUM) 40 MG capsule Take 1 capsule (40 mg total) by mouth daily before breakfast. 08/28/19 05/18/20  Mansouraty, Netty Starring., MD  famotidine (PEPCID) 20 MG tablet Take 1 tablet (20 mg total) by mouth 2 (two) times daily. 10/24/19 05/18/20  Renne Crigler, PA-C  fluticasone (FLONASE) 50 MCG/ACT nasal spray Place 2 sprays into both nostrils daily. Patient not taking: Reported on 01/27/2019 06/05/18 05/01/19  Palumbo, April, MD  medroxyPROGESTERone (PROVERA) 10 MG tablet Take 1 tablet (10 mg total) by mouth daily for 7 days. 03/04/19 05/01/19  Sharlene Dory, DO  SUMAtriptan (IMITREX) 100 MG tablet Take 1 tablet (100 mg total) by mouth every 2 (two) hours as needed for migraine. May repeat in 2 hours if headache persists or recurs. Patient not taking: Reported on 03/27/2019 03/04/19 05/01/19  Sharlene Dory, DO    Family History Family History  Problem Relation Age of Onset   Asthma Mother    Hypertension Mother    Diabetes Father    Diabetes Sister    Colon cancer Maternal Aunt    Rectal cancer Cousin    Cancer Neg Hx    Colon polyps Neg Hx    Esophageal cancer Neg Hx    Stomach cancer Neg Hx     Social History Social History   Tobacco Use   Smoking status: Never   Smokeless tobacco: Never  Vaping Use   Vaping status: Never Used  Substance Use Topics   Alcohol use: No   Drug use: No     Allergies   Fruit extracts, Gluten meal, Peach [prunus persica], Compazine [prochlorperazine], Miconazole, Latex, and Tramadol   Review of Systems Review of Systems  Respiratory:  Positive for wheezing.   Musculoskeletal:  Positive for back pain.     Physical Exam Triage Vital Signs ED Triage Vitals  Encounter Vitals Group     BP 06/09/23 1533 118/74     Systolic BP Percentile --      Diastolic BP Percentile --      Pulse Rate 06/09/23 1533 95     Resp 06/09/23 1533 16     Temp 06/09/23 1533 98.7 F (37.1 C)     Temp Source 06/09/23 1533 Oral     SpO2 06/09/23 1533 99 %     Weight --      Height --      Head Circumference --      Peak Flow --      Pain Score 06/09/23 1532 6     Pain Loc --      Pain Education --      Exclude from Growth Chart --    No data found.  Updated Vital Signs BP 118/74 (BP Location: Right Arm)   Pulse 95   Temp 98.7 F (37.1 C) (Oral)   Resp 16   LMP 06/01/2023 (Exact Date)   SpO2 99%   Visual Acuity Right Eye Distance:   Left Eye Distance:   Bilateral Distance:    Right Eye Near:   Left Eye Near:    Bilateral Near:     Physical Exam   UC Treatments /  Results  Labs (all labs ordered  are listed, but only abnormal results are displayed) Labs Reviewed - No data to display  EKG   Radiology No results found.  Procedures Procedures (including critical care time)  Medications Ordered in UC Medications - No data to display  Initial Impression / Assessment and Plan / UC Course  I have reviewed the triage vital signs and the nursing notes.  Pertinent labs & imaging results that were available during my care of the patient were reviewed by me and considered in my medical decision making (see chart for details).     *** Final Clinical Impressions(s) / UC Diagnoses   Final diagnoses:  None   Discharge Instructions   None    ED Prescriptions   None    PDMP not reviewed this encounter.

## 2023-10-14 ENCOUNTER — Telehealth: Admitting: Physician Assistant

## 2023-10-14 DIAGNOSIS — T3695XA Adverse effect of unspecified systemic antibiotic, initial encounter: Secondary | ICD-10-CM | POA: Diagnosis not present

## 2023-10-14 DIAGNOSIS — K047 Periapical abscess without sinus: Secondary | ICD-10-CM

## 2023-10-14 DIAGNOSIS — L309 Dermatitis, unspecified: Secondary | ICD-10-CM | POA: Diagnosis not present

## 2023-10-14 DIAGNOSIS — B379 Candidiasis, unspecified: Secondary | ICD-10-CM | POA: Diagnosis not present

## 2023-10-14 MED ORDER — AMOXICILLIN 400 MG/5ML PO SUSR: 400.0000 mg | Freq: Three times a day (TID) | ORAL | 0 refills | Status: AC

## 2023-10-14 MED ORDER — FLUCONAZOLE 150 MG PO TABS
150.0000 mg | ORAL_TABLET | ORAL | 0 refills | Status: AC | PRN
Start: 2023-10-14 — End: ?

## 2023-10-14 MED ORDER — FLUOCINOLONE ACETONIDE SCALP 0.01 % EX OIL: TOPICAL_OIL | CUTANEOUS | 0 refills | Status: AC

## 2023-10-14 MED ORDER — KETOCONAZOLE 2 % EX CREA: 1.0000 | TOPICAL_CREAM | Freq: Every day | CUTANEOUS | 0 refills | Status: AC

## 2023-10-14 NOTE — Progress Notes (Signed)
 Virtual Visit Consent   Brianna Byrd, you are scheduled for a virtual visit with a [facility] provider today. Just as with appointments in the office, your consent must be obtained to participate. Your consent will be active for this visit and any virtual visit you may have with one of our providers in the next 365 days. If you have a MyChart account, a copy of this consent can be sent to you electronically.  As this is a virtual visit, video technology does not allow for your provider to perform a traditional examination. This may limit your provider's ability to fully assess your condition. If your provider identifies any concerns that need to be evaluated in person or the need to arrange testing (such as labs, EKG, etc.), we will make arrangements to do so. Although advances in technology are sophisticated, we cannot ensure that it will always work on either your end or our end. If the connection with a video visit is poor, the visit may have to be switched to a telephone visit. With either a video or telephone visit, we are not always able to ensure that we have a secure connection.  By engaging in this virtual visit, you consent to the provision of healthcare and authorize for your insurance to be billed (if applicable) for the services provided during this visit. Depending on your insurance coverage, you may receive a charge related to this service.  I need to obtain your verbal consent now. Are you willing to proceed with your visit today? Brianna Byrd has provided verbal consent on 10/14/2023 for a virtual visit (video or telephone). Brianna CHRISTELLA Dickinson, PA-C  Date: 10/14/2023 12:04 PM   Virtual Visit via Video Note   IDelon CHRISTELLA Byrd, connected with  Brianna Byrd  ([id number], 04/23/1995) on 10/14/23 at 11:00 AM EDT by a video-enabled telemedicine application and verified that I am speaking with the correct person using two identifiers.  Location: Patient: Virtual  Visit Location Patient: Home Provider: Virtual Visit Location Provider: Home Office   I discussed the limitations of evaluation and management by telemedicine and the availability of in person appointments. The patient expressed understanding and agreed to proceed.    History of Present Illness: Brianna Byrd is a 28 y.o. who identifies as a female who was assigned female at birth, and is being seen today for multiple issues.  1) facial dermatitis- has seen Dermatology before and was prescribed Ketoconazole  and Fluocinolone  scalp oil for treatment. She is having a recurrence of symptoms and is requesting refills of these medications as they helped previously. She is unable to see her Dermatologist in a timely manner for follow up.   She is also having this rash now occur on her chest also.  2) She reports having swollen lymph nodes on the right side around her ear and under her jaw. She also is feeling a lump in the right lower jaw area and in the gumline. She is having painful chewing and sensitivity.    Problems:  Patient Active Problem List   Diagnosis Date Noted   Sinus tachycardia 10/18/2022   Anemia 10/18/2022   Celiac disease 10/18/2022   Witnessed apneic spells 08/30/2020   Chronic tension-type headache 07/28/2020   Daytime somnolence 07/28/2020   Obesity 07/28/2020   Vitamin B12 deficiency 07/28/2020   Heartburn 07/30/2019   Migraine without aura and without status migrainosus, not intractable 03/04/2019   Left breast mass 01/27/2019   Amenorrhea 08/26/2017   Goiter diffuse 08/26/2017  History of pre-eclampsia 02/12/2017   SVD (spontaneous vaginal delivery) 12/26/2016   Psychophysiologic insomnia 12/21/2016   Trichomonal vaginitis during pregnancy, antepartum 06/05/2016   Constitutional short stature 05/14/2016    Allergies:  Allergies  Allergen Reactions   Fruit Extracts Shortness Of Breath, Swelling and Other (See Comments)    Anything tropical- Mouth  swells   Gluten Meal Diarrhea, Nausea And Vomiting and Other (See Comments)    HAS CELIAC DISEASE   Peach [Prunus Persica] Shortness Of Breath, Swelling and Other (See Comments)    Mouth swells   Compazine  [Prochlorperazine ] Anxiety and Other (See Comments)    Severe akathisia (use ativan  instead of diphenhydramine  for H/A cocktails)     Miconazole Swelling and Other (See Comments)    Can use Diflucan  only   Latex Other (See Comments)    Reaction is worse than a rash   Tramadol Other (See Comments)    Hallucinations    Medications:  Current Outpatient Medications:    amoxicillin  (AMOXIL ) 400 MG/5ML suspension, Take 5 mLs (400 mg total) by mouth 3 (three) times daily for 7 days., Disp: 105 mL, Rfl: 0   fluconazole  (DIFLUCAN ) 150 MG tablet, Take 1 tablet (150 mg total) by mouth every 3 (three) days as needed., Disp: 2 tablet, Rfl: 0   ketoconazole  (NIZORAL ) 2 % cream, Apply 1 Application topically daily., Disp: 15 g, Rfl: 0   albuterol  (VENTOLIN  HFA) 108 (90 Base) MCG/ACT inhaler, Inhale 2 puffs into the lungs every 6 (six) hours as needed for wheezing or shortness of breath., Disp: 8 g, Rfl: 0   benzonatate  (TESSALON ) 100 MG capsule, Take 1 capsule (100 mg total) by mouth 3 (three) times daily as needed for cough., Disp: 30 capsule, Rfl: 0   Cholecalciferol (VITAMIN D3) 50 MCG (2000 UT) capsule, Take 1 capsule by mouth daily., Disp: , Rfl:    cyclobenzaprine  (FLEXERIL ) 10 MG tablet, Take 10 mg by mouth 3 (three) times daily as needed., Disp: , Rfl:    EPINEPHrine 0.3 mg/0.3 mL IJ SOAJ injection, Inject 0.3 mg into the muscle as needed., Disp: , Rfl:    Fluocinolone  Acetonide Scalp 0.01 % OIL, Apply topically at bedtime as needed, Disp: 118 mL, Rfl: 0   metoprolol  tartrate (LOPRESSOR ) 25 MG tablet, Take 1 tablet (25 mg total) by mouth daily as needed (heart rate >120 bpm)., Disp: 20 tablet, Rfl: 0   pantoprazole  (PROTONIX ) 20 MG tablet, Take 1 tablet (20 mg total) by mouth daily for 14  days., Disp: 14 tablet, Rfl: 0   potassium chloride  SA (KLOR-CON  M) 20 MEQ tablet, Take 1 tablet (20 mEq total) by mouth daily for 7 days., Disp: 7 tablet, Rfl: 0   sucralfate  (CARAFATE ) 1 g tablet, Take 1 tablet (1 g total) by mouth 4 (four) times daily -  with meals and at bedtime for 7 days., Disp: 28 tablet, Rfl: 0   valACYclovir  (VALTREX ) 500 MG tablet, Take 500 mg by mouth 2 (two) times daily., Disp: , Rfl:   Observations/Objective: Patient is well-developed, well-nourished in no acute distress.  Resting comfortably at home.  Head is normocephalic, atraumatic.  No labored breathing.  Speech is clear and coherent with logical content.  Patient is alert and oriented at baseline.  Fine papular rash diffuse over entire face, reports burning and tightness  Assessment and Plan: 1. Dermatitis (Primary) - ketoconazole  (NIZORAL ) 2 % cream; Apply 1 Application topically daily.  Dispense: 15 g; Refill: 0 - Fluocinolone  Acetonide Scalp 0.01 % OIL; Apply  topically at bedtime as needed  Dispense: 118 mL; Refill: 0  2. Dental abscess - amoxicillin  (AMOXIL ) 400 MG/5ML suspension; Take 5 mLs (400 mg total) by mouth 3 (three) times daily for 7 days.  Dispense: 105 mL; Refill: 0  3. Antibiotic-induced yeast infection - fluconazole  (DIFLUCAN ) 150 MG tablet; Take 1 tablet (150 mg total) by mouth every 3 (three) days as needed.  Dispense: 2 tablet; Refill: 0  - Medications refilled for Dermatitis as requested  - Amoxicillin  prescribed for dental abscess - Fluconazole  for antibiotic induced yeast infections - Keep skin clean and dry - Moisturize well - Seek in person evaluation if either issue worsens or fails to improve  Follow Up Instructions: I discussed the assessment and treatment plan with the patient. The patient was provided an opportunity to ask questions and all were answered. The patient agreed with the plan and demonstrated an understanding of the instructions.  A copy of instructions  were sent to the patient via MyChart unless otherwise noted below.    The patient was advised to call back or seek an in-person evaluation if the symptoms worsen or if the condition fails to improve as anticipated.    Brianna CHRISTELLA Dickinson, PA-C

## 2023-10-14 NOTE — Patient Instructions (Signed)
 Jadalee Norway, thank you for joining [name], PA-C for today's virtual visit.  While this provider is not your primary care provider (PCP), if your PCP is located in our provider database this encounter information will be shared with them immediately following your visit.   A [facility] MyChart account gives you access to today's visit and all your visits, tests, and labs performed at [facility]  click here if you don't have a [facility] MyChart account or go to [email or web address]  Consent: (Patient) Brianna Byrd provided verbal consent for this virtual visit at the beginning of the encounter.  Current Medications:  Current Outpatient Medications:    amoxicillin  (AMOXIL ) 400 MG/5ML suspension, Take 5 mLs (400 mg total) by mouth 3 (three) times daily for 7 days., Disp: 105 mL, Rfl: 0   fluconazole  (DIFLUCAN ) 150 MG tablet, Take 1 tablet (150 mg total) by mouth every 3 (three) days as needed., Disp: 2 tablet, Rfl: 0   ketoconazole  (NIZORAL ) 2 % cream, Apply 1 Application topically daily., Disp: 15 g, Rfl: 0   albuterol  (VENTOLIN  HFA) 108 (90 Base) MCG/ACT inhaler, Inhale 2 puffs into the lungs every 6 (six) hours as needed for wheezing or shortness of breath., Disp: 8 g, Rfl: 0   benzonatate  (TESSALON ) 100 MG capsule, Take 1 capsule (100 mg total) by mouth 3 (three) times daily as needed for cough., Disp: 30 capsule, Rfl: 0   Cholecalciferol (VITAMIN D3) 50 MCG (2000 UT) capsule, Take 1 capsule by mouth daily., Disp: , Rfl:    cyclobenzaprine  (FLEXERIL ) 10 MG tablet, Take 10 mg by mouth 3 (three) times daily as needed., Disp: , Rfl:    EPINEPHrine 0.3 mg/0.3 mL IJ SOAJ injection, Inject 0.3 mg into the muscle as needed., Disp: , Rfl:    Fluocinolone  Acetonide Scalp 0.01 % OIL, Apply topically at bedtime as needed, Disp: 118 mL, Rfl: 0   metoprolol  tartrate (LOPRESSOR ) 25 MG tablet, Take 1 tablet (25 mg total) by mouth daily as needed (heart rate >120  bpm)., Disp: 20 tablet, Rfl: 0   pantoprazole  (PROTONIX ) 20 MG tablet, Take 1 tablet (20 mg total) by mouth daily for 14 days., Disp: 14 tablet, Rfl: 0   potassium chloride  SA (KLOR-CON  M) 20 MEQ tablet, Take 1 tablet (20 mEq total) by mouth daily for 7 days., Disp: 7 tablet, Rfl: 0   sucralfate  (CARAFATE ) 1 g tablet, Take 1 tablet (1 g total) by mouth 4 (four) times daily -  with meals and at bedtime for 7 days., Disp: 28 tablet, Rfl: 0   valACYclovir  (VALTREX ) 500 MG tablet, Take 500 mg by mouth 2 (two) times daily., Disp: , Rfl:    Medications ordered in this encounter:  Meds ordered this encounter  Medications   ketoconazole  (NIZORAL ) 2 % cream    Sig: Apply 1 Application topically daily.    Dispense:  15 g    Refill:  0    Supervising Provider:   BLAISE ALEENE KIDD [[id number]]   amoxicillin  (AMOXIL ) 400 MG/5ML suspension    Sig: Take 5 mLs (400 mg total) by mouth 3 (three) times daily for 7 days.    Dispense:  105 mL    Refill:  0    Supervising Provider:   BLAISE ALEENE KIDD [[id number]]   fluconazole  (DIFLUCAN ) 150 MG tablet    Sig: Take 1 tablet (150 mg total) by mouth every 3 (three) days as needed.    Dispense:  2 tablet    Refill:  0    Supervising Provider:   BLAISE ALEENE KIDD [[id number]]   Fluocinolone  Acetonide Scalp 0.01 % OIL    Sig: Apply topically at bedtime as needed    Dispense:  118 mL    Refill:  0    Supervising Provider:   BLAISE ALEENE KIDD [[id number]]     *If you need refills on other medications prior to your next appointment, please contact your pharmacy*  Follow-Up: Call back or seek an in-person evaluation if the symptoms worsen or if the condition fails to improve as anticipated.  [facility] [phone]  Other Instructions  Dental Abscess  A dental abscess is an infection around a tooth that may involve pain, swelling, and a collection of pus, as well as other symptoms. Treatment is important to help with symptoms and to prevent the  infection from spreading. The general types of dental abscesses are: Pulpal abscess. This abscess may form from the inner part of the tooth (pulp). Periodontal abscess. This abscess may form from the gum. What are the causes? This condition is caused by a bacterial infection in or around the tooth. It may result from: Severe tooth decay (cavities). Trauma to the tooth, such as a broken or chipped tooth. What increases the risk? This condition is more likely to develop in males. It is also more likely to develop in people who: Have cavities. Have severe gum disease. Eat sugary snacks between meals. Use tobacco products. Have diabetes. Have a weakened disease-fighting system (immune system). Do not brush and care for their teeth regularly. What are the signs or symptoms? Mild symptoms of this condition include: Tenderness. Bad breath. Fever. A bitter taste in the mouth. Pain in and around the infected tooth. Moderate symptoms of this condition include: Swollen neck glands. Chills. Pus drainage. Swelling and redness around the infected tooth, in the mouth, or in the face. Severe pain in and around the infected tooth. Severe symptoms of this condition include: Difficulty swallowing. Difficulty opening the mouth. Nausea. Vomiting. How is this diagnosed? This condition is diagnosed based on: Your symptoms and your medical and dental history. An examination of the infected tooth. During the exam, your dental care provider may tap on the infected tooth. You may also need to have X-rays taken of the affected area. How is this treated? This condition is treated by getting rid of the infection. This may be done with: Antibiotic medicines. These may be used in certain situations. Antibacterial mouth rinse. Incision and drainage. This procedure is done by making an incision in the abscess to drain out the pus. Removing pus is the first priority in treating an abscess. A root canal.  This may be performed to save the tooth. Your dental care provider accesses the visible part of your tooth (crown) with a drill and removes any infected pulp. Then the space is filled and sealed off. Tooth extraction. The tooth is pulled out if it cannot be saved by other treatment. You may also receive treatment for pain, such as: Acetaminophen  or NSAIDs. Gels that contain a numbing medicine. An injection to block the pain near your nerve. Follow these instructions at home: Medicines Take over-the-counter and prescription medicines only as told by your dental care provider. If you were prescribed an antibiotic, take it as told by your dental care provider. Do not stop taking the antibiotic even if you start to feel better. If you were prescribed a gel that contains a numbing medicine, use it exactly as  told in the directions. Do not use these gels for children who are younger than 42 years of age. Use an antibacterial mouth rinse as told by your dental care provider. General instructions  Gargle with a mixture of salt and water 3-4 times a day or as needed. To make salt water, completely dissolve -1 tsp (3-6 g) of salt in 1 cup (237 mL) of warm water. Eat a soft diet while your abscess is healing. Drink enough fluid to keep your urine pale yellow. Do not apply heat to the outside of your mouth. Do not use any products that contain nicotine or tobacco. These products include cigarettes, chewing tobacco, and vaping devices, such as e-cigarettes. If you need help quitting, ask your dental care provider. Keep all follow-up visits. This is important. How is this prevented?  Excellent dental home care, which includes brushing your teeth every morning and night with fluoride toothpaste. Floss one time each day. Get regularly scheduled dental cleanings. Consider having a dental sealant applied on teeth that have deep grooves to prevent cavities. Drink fluoridated water regularly. This includes most  tap water. Check the label on bottled water to see if it contains fluoride. Reduce or eliminate sugary drinks. Eat healthy meals and snacks. Wear a mouth guard or face shield to protect your teeth while playing sports. Contact a health care provider if: Your pain is worse and is not helped by medicine. You have swelling. You see pus around the tooth. You have a fever or chills. Get help right away if: Your symptoms suddenly get worse. You have a very bad headache. You have problems breathing or swallowing. You have trouble opening your mouth. You have swelling in your neck or around your eye. These symptoms may represent a serious problem that is an emergency. Do not wait to see if the symptoms will go away. Get medical help right away. Call your local emergency services (911 in the [place] Do not drive yourself to the hospital. Summary A dental abscess is a collection of pus in or around a tooth that results from an infection. A dental abscess may result from severe tooth decay, trauma to the tooth, or severe gum disease around a tooth. Symptoms include severe pain, swelling, redness, and drainage of pus in and around the infected tooth. The first priority in treating a dental abscess is to drain out the pus. Treatment may also involve removing damage inside the tooth (root canal) or extracting the tooth. This information is not intended to replace advice given to you by your health care provider. Make sure you discuss any questions you have with your health care provider. Document Revised: 05/12/2020 Document Reviewed: 05/12/2020 Elsevier Patient Education  2024 Elsevier Inc.   If you have been instructed to have an in-person evaluation today at a local Urgent Care facility, please use the link below. It will take you to a list of all of our available [facility], including address, phone number and hours of operation. Please do not delay care.  [facility]  If you or a family member do not have a primary care provider, use the link below to schedule a visit and establish care. When you choose a [facility] primary care physician or advanced practice provider, you gain a long-term partner in health. Find a Primary Care Provider  Learn more about [facility]'s in-office and virtual care options: [facility] - Get Care Now

## 2023-10-21 ENCOUNTER — Telehealth: Admitting: Physician Assistant

## 2023-10-21 ENCOUNTER — Encounter

## 2023-10-21 DIAGNOSIS — L2084 Intrinsic (allergic) eczema: Secondary | ICD-10-CM

## 2023-10-21 MED ORDER — KETOCONAZOLE 2 % EX CREA: TOPICAL_CREAM | Freq: Two times a day (BID) | CUTANEOUS | 1 refills | Status: AC | PRN

## 2023-10-21 NOTE — Patient Instructions (Signed)
 Brianna Byrd, thank you for joining [name], PA-C for today's virtual visit.  While this provider is not your primary care provider (PCP), if your PCP is located in our provider database this encounter information will be shared with them immediately following your visit.   A [facility] MyChart account gives you access to today's visit and all your visits, tests, and labs performed at [facility]  click here if you don't have a [facility] MyChart account or go to [email or web address]  Consent: (Patient) Brianna Byrd provided verbal consent for this virtual visit at the beginning of the encounter.  Current Medications:  Current Outpatient Medications:    fluticasone  0.05%-ketoconazole  2% 1:1 cream mixture, Apply topically 2 (two) times daily as needed., Disp: 60 g, Rfl: 1   albuterol  (VENTOLIN  HFA) 108 (90 Base) MCG/ACT inhaler, Inhale 2 puffs into the lungs every 6 (six) hours as needed for wheezing or shortness of breath., Disp: 8 g, Rfl: 0   amoxicillin  (AMOXIL ) 400 MG/5ML suspension, Take 5 mLs (400 mg total) by mouth 3 (three) times daily for 7 days., Disp: 105 mL, Rfl: 0   benzonatate  (TESSALON ) 100 MG capsule, Take 1 capsule (100 mg total) by mouth 3 (three) times daily as needed for cough., Disp: 30 capsule, Rfl: 0   Cholecalciferol (VITAMIN D3) 50 MCG (2000 UT) capsule, Take 1 capsule by mouth daily., Disp: , Rfl:    cyclobenzaprine  (FLEXERIL ) 10 MG tablet, Take 10 mg by mouth 3 (three) times daily as needed., Disp: , Rfl:    EPINEPHrine 0.3 mg/0.3 mL IJ SOAJ injection, Inject 0.3 mg into the muscle as needed., Disp: , Rfl:    fluconazole  (DIFLUCAN ) 150 MG tablet, Take 1 tablet (150 mg total) by mouth every 3 (three) days as needed., Disp: 2 tablet, Rfl: 0   Fluocinolone  Acetonide Scalp 0.01 % OIL, Apply topically at bedtime as needed, Disp: 118 mL, Rfl: 0   ketoconazole  (NIZORAL ) 2 % cream, Apply 1 Application topically daily., Disp: 15 g,  Rfl: 0   metoprolol  tartrate (LOPRESSOR ) 25 MG tablet, Take 1 tablet (25 mg total) by mouth daily as needed (heart rate >120 bpm)., Disp: 20 tablet, Rfl: 0   pantoprazole  (PROTONIX ) 20 MG tablet, Take 1 tablet (20 mg total) by mouth daily for 14 days., Disp: 14 tablet, Rfl: 0   potassium chloride  SA (KLOR-CON  M) 20 MEQ tablet, Take 1 tablet (20 mEq total) by mouth daily for 7 days., Disp: 7 tablet, Rfl: 0   sucralfate  (CARAFATE ) 1 g tablet, Take 1 tablet (1 g total) by mouth 4 (four) times daily -  with meals and at bedtime for 7 days., Disp: 28 tablet, Rfl: 0   valACYclovir  (VALTREX ) 500 MG tablet, Take 500 mg by mouth 2 (two) times daily., Disp: , Rfl:    Medications ordered in this encounter:  Meds ordered this encounter  Medications   fluticasone  0.05%-ketoconazole  2% 1:1 cream mixture    Sig: Apply topically 2 (two) times daily as needed.    Dispense:  60 g    Refill:  1    Supervising Provider:   BLAISE ALEENE KIDD [id number]     *If you need refills on other medications prior to your next appointment, please contact your pharmacy*  Follow-Up: Call back or seek an in-person evaluation if the symptoms worsen or if the condition fails to improve as anticipated.  [facility] [phone]   If you have been instructed to have an in-person evaluation today  at a local Urgent Care facility, please use the link below. It will take you to a list of all of our available [facility], including address, phone number and hours of operation. Please do not delay care.  [facility]  If you or a family member do not have a primary care provider, use the link below to schedule a visit and establish care. When you choose a [facility] primary care physician or advanced practice provider, you gain a long-term partner in health. Find a Primary Care Provider  Learn more about [facility]'s in-office and virtual care options: [facility] Get Care Now

## 2023-10-21 NOTE — Progress Notes (Signed)
 Virtual Visit Consent   Brianna Byrd, you are scheduled for a virtual visit with a  provider today. Just as with appointments in the office, your consent must be obtained to participate. Your consent will be active for this visit and any virtual visit you may have with one of our providers in the next 365 days. If you have a MyChart account, a copy of this consent can be sent to you electronically.  As this is a virtual visit, video technology does not allow for your provider to perform a traditional examination. This may limit your provider's ability to fully assess your condition. If your provider identifies any concerns that need to be evaluated in person or the need to arrange testing (such as labs, EKG, etc.), we will make arrangements to do so. Although advances in technology are sophisticated, we cannot ensure that it will always work on either your end or our end. If the connection with a video visit is poor, the visit may have to be switched to a telephone visit. With either a video or telephone visit, we are not always able to ensure that we have a secure connection.  By engaging in this virtual visit, you consent to the provision of healthcare and authorize for your insurance to be billed (if applicable) for the services provided during this visit. Depending on your insurance coverage, you may receive a charge related to this service.  I need to obtain your verbal consent now. Are you willing to proceed with your visit today? Brianna Byrd has provided verbal consent on 10/21/2023 for a virtual visit (video or telephone). Brianna CHRISTELLA Dickinson, PA-C  Date: 10/21/2023 10:17 AM   Virtual Visit via Video Note   I, Brianna Byrd, connected with  Brianna Byrd  ([id number], 08-10-1995) on 10/21/23 at 10:15 AM EDT by a video-enabled telemedicine application and verified that I am speaking with the correct person using two identifiers.  Location: Patient: Virtual  Visit Location Patient: Home Provider: Virtual Visit Location Provider: Home Office   I discussed the limitations of evaluation and management by telemedicine and the availability of in person appointments. The patient expressed understanding and agreed to proceed.    History of Present Illness: Brianna Byrd is a 28 y.o. who identifies as a female who was assigned female at birth, and is being seen today for facial dermatitis.  HPI: Rash This is a recurrent problem. The current episode started 1 to 4 weeks ago. The problem has been waxing and waning since onset. The affected locations include the face. The rash is characterized by dryness, burning, itchiness and scaling. She was exposed to nothing. Pertinent negatives include no congestion, fatigue or fever. Past treatments include antibiotics and anti-itch cream (topical ketoconazole , aquaphor). The treatment provided no relief.    Problems:  Patient Active Problem List   Diagnosis Date Noted   Sinus tachycardia 10/18/2022   Anemia 10/18/2022   Celiac disease 10/18/2022   Witnessed apneic spells 08/30/2020   Chronic tension-type headache 07/28/2020   Daytime somnolence 07/28/2020   Obesity 07/28/2020   Vitamin B12 deficiency 07/28/2020   Heartburn 07/30/2019   Migraine without aura and without status migrainosus, not intractable 03/04/2019   Left breast mass 01/27/2019   Amenorrhea 08/26/2017   Goiter diffuse 08/26/2017   History of pre-eclampsia 02/12/2017   SVD (spontaneous vaginal delivery) 12/26/2016   Psychophysiologic insomnia 12/21/2016   Trichomonal vaginitis during pregnancy, antepartum 06/05/2016   Constitutional short stature 05/14/2016    Allergies:  Allergies  Allergen Reactions   Fruit Extracts Shortness Of Breath, Swelling and Other (See Comments)    Anything tropical- Mouth swells   Gluten Meal Diarrhea, Nausea And Vomiting and Other (See Comments)    HAS CELIAC DISEASE   Peach [Prunus Persica]  Shortness Of Breath, Swelling and Other (See Comments)    Mouth swells   Compazine  [Prochlorperazine ] Anxiety and Other (See Comments)    Severe akathisia (use ativan  instead of diphenhydramine  for H/A cocktails)     Miconazole Swelling and Other (See Comments)    Can use Diflucan  only   Latex Other (See Comments)    Reaction is worse than a rash   Tramadol Other (See Comments)    Hallucinations    Medications:  Current Outpatient Medications:    fluticasone  0.05%-ketoconazole  2% 1:1 cream mixture, Apply topically 2 (two) times daily as needed., Disp: 60 g, Rfl: 1   albuterol  (VENTOLIN  HFA) 108 (90 Base) MCG/ACT inhaler, Inhale 2 puffs into the lungs every 6 (six) hours as needed for wheezing or shortness of breath., Disp: 8 g, Rfl: 0   amoxicillin  (AMOXIL ) 400 MG/5ML suspension, Take 5 mLs (400 mg total) by mouth 3 (three) times daily for 7 days., Disp: 105 mL, Rfl: 0   benzonatate  (TESSALON ) 100 MG capsule, Take 1 capsule (100 mg total) by mouth 3 (three) times daily as needed for cough., Disp: 30 capsule, Rfl: 0   Cholecalciferol (VITAMIN D3) 50 MCG (2000 UT) capsule, Take 1 capsule by mouth daily., Disp: , Rfl:    cyclobenzaprine  (FLEXERIL ) 10 MG tablet, Take 10 mg by mouth 3 (three) times daily as needed., Disp: , Rfl:    EPINEPHrine 0.3 mg/0.3 mL IJ SOAJ injection, Inject 0.3 mg into the muscle as needed., Disp: , Rfl:    fluconazole  (DIFLUCAN ) 150 MG tablet, Take 1 tablet (150 mg total) by mouth every 3 (three) days as needed., Disp: 2 tablet, Rfl: 0   Fluocinolone  Acetonide Scalp 0.01 % OIL, Apply topically at bedtime as needed, Disp: 118 mL, Rfl: 0   ketoconazole  (NIZORAL ) 2 % cream, Apply 1 Application topically daily., Disp: 15 g, Rfl: 0   metoprolol  tartrate (LOPRESSOR ) 25 MG tablet, Take 1 tablet (25 mg total) by mouth daily as needed (heart rate >120 bpm)., Disp: 20 tablet, Rfl: 0   pantoprazole  (PROTONIX ) 20 MG tablet, Take 1 tablet (20 mg total) by mouth daily for 14 days.,  Disp: 14 tablet, Rfl: 0   potassium chloride  SA (KLOR-CON  M) 20 MEQ tablet, Take 1 tablet (20 mEq total) by mouth daily for 7 days., Disp: 7 tablet, Rfl: 0   sucralfate  (CARAFATE ) 1 g tablet, Take 1 tablet (1 g total) by mouth 4 (four) times daily -  with meals and at bedtime for 7 days., Disp: 28 tablet, Rfl: 0   valACYclovir  (VALTREX ) 500 MG tablet, Take 500 mg by mouth 2 (two) times daily., Disp: , Rfl:   Observations/Objective: Patient is well-developed, well-nourished in no acute distress.  Resting comfortably at home.  Head is normocephalic, atraumatic.  No labored breathing.  Speech is clear and coherent with logical content.  Patient is alert and oriented at baseline.    Assessment and Plan: 1. Intrinsic eczema (Primary) - fluticasone  0.05%-ketoconazole  2% 1:1 cream mixture; Apply topically 2 (two) times daily as needed.  Dispense: 60 g; Refill: 1  - Recurrent eczematous dermatitis on face - Had been prescribed generic Ketoconazole  which is not helping - Has been prescribed Ketoconazole -Fluticasone   compound cream which is more effective,  this has been prescribed today - Continue other treatments as previously discussed  Follow Up Instructions: I discussed the assessment and treatment plan with the patient. The patient was provided an opportunity to ask questions and all were answered. The patient agreed with the plan and demonstrated an understanding of the instructions.  A copy of instructions were sent to the patient via MyChart unless otherwise noted below.    The patient was advised to call back or seek an in-person evaluation if the symptoms worsen or if the condition fails to improve as anticipated.    Brianna CHRISTELLA Dickinson, PA-C

## 2023-12-03 ENCOUNTER — Telehealth: Admitting: Physician Assistant

## 2023-12-03 DIAGNOSIS — K047 Periapical abscess without sinus: Secondary | ICD-10-CM

## 2023-12-03 MED ORDER — AMOXICILLIN-POT CLAVULANATE 400-57 MG/5ML PO SUSR: ORAL | 0 refills | Status: AC

## 2023-12-03 MED ORDER — CHLORHEXIDINE GLUCONATE 0.12 % MT SOLN: 15.0000 mL | Freq: Two times a day (BID) | OROMUCOSAL | 0 refills | Status: AC

## 2023-12-03 NOTE — Progress Notes (Signed)
 Virtual Visit Consent   Brianna Byrd, you are scheduled for a virtual visit with a [facility] provider today. Just as with appointments in the office, your consent must be obtained to participate. Your consent will be active for this visit and any virtual visit you may have with one of our providers in the next 365 days. If you have a MyChart account, a copy of this consent can be sent to you electronically.  As this is a virtual visit, video technology does not allow for your provider to perform a traditional examination. This may limit your provider's ability to fully assess your condition. If your provider identifies any concerns that need to be evaluated in person or the need to arrange testing (such as labs, EKG, etc.), we will make arrangements to do so. Although advances in technology are sophisticated, we cannot ensure that it will always work on either your end or our end. If the connection with a video visit is poor, the visit may have to be switched to a telephone visit. With either a video or telephone visit, we are not always able to ensure that we have a secure connection.  By engaging in this virtual visit, you consent to the provision of healthcare and authorize for your insurance to be billed (if applicable) for the services provided during this visit. Depending on your insurance coverage, you may receive a charge related to this service.  I need to obtain your verbal consent now. Are you willing to proceed with your visit today? Brianna Byrd has provided verbal consent on 12/03/2023 for a virtual visit (video or telephone). Brianna Byrd, [place]  Date: 12/03/2023 12:50 PM   Virtual Visit via Video Note   I, Brianna Byrd, connected with  Sacred Roa  ([id number], 01/10/1996) on 12/03/23 at 12:45 PM EDT by a video-enabled telemedicine application and verified that I am speaking with the correct person using two identifiers.  Location: Patient: Virtual  Visit Location Patient: Home Provider: Virtual Visit Location Provider: Home Office   I discussed the limitations of evaluation and management by telemedicine and the availability of in person appointments. The patient expressed understanding and agreed to proceed.    History of Present Illness: Brianna Byrd is a 28 y.o. who identifies as a female who was assigned female at birth, and is being seen today for possible dental abscess. Endorses pain and swelling of R lower premolar over the past week. Has had infection with this issue in the past -- has root canal pending. Denies fever, chills.  HPI: HPI  Problems:  Patient Active Problem List   Diagnosis Date Noted   Sinus tachycardia 10/18/2022   Anemia 10/18/2022   Celiac disease 10/18/2022   Witnessed apneic spells 08/30/2020   Chronic tension-type headache 07/28/2020   Daytime somnolence 07/28/2020   Obesity 07/28/2020   Vitamin B12 deficiency 07/28/2020   Heartburn 07/30/2019   Migraine without aura and without status migrainosus, not intractable 03/04/2019   Left breast mass 01/27/2019   Amenorrhea 08/26/2017   Goiter diffuse 08/26/2017   History of pre-eclampsia 02/12/2017   SVD (spontaneous vaginal delivery) 12/26/2016   Psychophysiologic insomnia 12/21/2016   Trichomonal vaginitis during pregnancy, antepartum 06/05/2016   Constitutional short stature 05/14/2016    Allergies:  Allergies  Allergen Reactions   Fruit Extracts Shortness Of Breath, Swelling and Other (See Comments)    Anything tropical- Mouth swells   Gluten Meal Diarrhea, Nausea And Vomiting and Other (See Comments)    HAS CELIAC  DISEASE   Peach [Prunus Persica] Shortness Of Breath, Swelling and Other (See Comments)    Mouth swells   Compazine  [Prochlorperazine ] Anxiety and Other (See Comments)    Severe akathisia (use ativan  instead of diphenhydramine  for H/A cocktails)     Miconazole Swelling and Other (See Comments)    Can use Diflucan  only    Latex Other (See Comments)    Reaction is worse than a rash   Tramadol Other (See Comments)    Hallucinations    Medications:  Current Outpatient Medications:    amoxicillin -clavulanate (AUGMENTIN ) 400-57 MG/5ML suspension, Take PO 10 mL twice daily for 10 days., Disp: 100 mL, Rfl: 0   chlorhexidine  (PERIDEX ) 0.12 % solution, Use as directed 15 mLs in the mouth or throat 2 (two) times daily., Disp: 120 mL, Rfl: 0   Cholecalciferol (VITAMIN D3) 50 MCG (2000 UT) capsule, Take 1 capsule by mouth daily., Disp: , Rfl:    cyclobenzaprine  (FLEXERIL ) 10 MG tablet, Take 10 mg by mouth 3 (three) times daily as needed., Disp: , Rfl:    EPINEPHrine 0.3 mg/0.3 mL IJ SOAJ injection, Inject 0.3 mg into the muscle as needed., Disp: , Rfl:    fluconazole  (DIFLUCAN ) 150 MG tablet, Take 1 tablet (150 mg total) by mouth every 3 (three) days as needed., Disp: 2 tablet, Rfl: 0   Fluocinolone  Acetonide Scalp 0.01 % OIL, Apply topically at bedtime as needed, Disp: 118 mL, Rfl: 0   fluticasone  0.05%-ketoconazole  2% 1:1 cream mixture, Apply topically 2 (two) times daily as needed., Disp: 60 g, Rfl: 1   ketoconazole  (NIZORAL ) 2 % cream, Apply 1 Application topically daily., Disp: 15 g, Rfl: 0   metoprolol  tartrate (LOPRESSOR ) 25 MG tablet, Take 1 tablet (25 mg total) by mouth daily as needed (heart rate >120 bpm)., Disp: 20 tablet, Rfl: 0   pantoprazole  (PROTONIX ) 20 MG tablet, Take 1 tablet (20 mg total) by mouth daily for 14 days., Disp: 14 tablet, Rfl: 0   potassium chloride  SA (KLOR-CON  M) 20 MEQ tablet, Take 1 tablet (20 mEq total) by mouth daily for 7 days., Disp: 7 tablet, Rfl: 0   sucralfate  (CARAFATE ) 1 g tablet, Take 1 tablet (1 g total) by mouth 4 (four) times daily -  with meals and at bedtime for 7 days., Disp: 28 tablet, Rfl: 0   valACYclovir  (VALTREX ) 500 MG tablet, Take 500 mg by mouth 2 (two) times daily., Disp: , Rfl:   Observations/Objective: Patient is well-developed, well-nourished in no acute  distress.  Resting comfortably at home.  Head is normocephalic, atraumatic.  No labored breathing. Speech is clear and coherent with logical content.  Patient is alert and oriented at baseline.   Assessment and Plan: 1. Dental infection (Primary) - chlorhexidine  (PERIDEX ) 0.12 % solution; Use as directed 15 mLs in the mouth or throat 2 (two) times daily.  Dispense: 120 mL; Refill: 0 - amoxicillin -clavulanate (AUGMENTIN ) 400-57 MG/5ML suspension; Take PO 10 mL twice daily for 10 days.  Dispense: 100 mL; Refill: 0  Supportive measures and OTC medications reviewed with patient. Peridex  solution per orders. Liquid Augmentin  (patient cannot swallow pills) per orders. Dental follow-up discussed..   Follow Up Instructions: I discussed the assessment and treatment plan with the patient. The patient was provided an opportunity to ask questions and all were answered. The patient agreed with the plan and demonstrated an understanding of the instructions.  A copy of instructions were sent to the patient via MyChart unless otherwise noted below.  The patient was advised to call back or seek an in-person evaluation if the symptoms worsen or if the condition fails to improve as anticipated.    Brianna Velma Lunger, PA-C

## 2023-12-03 NOTE — Patient Instructions (Signed)
 Brianna Byrd, thank you for joining Brianna Velma Lunger, Brianna Byrd for today's virtual visit.  While this provider is not your primary care provider (PCP), if your PCP is located in our provider database this encounter information will be shared with them immediately following your visit.   A Brianna Byrd account gives you access to today's visit and all your visits, tests, and labs performed at [facility]rd  click here if you don't have a Brianna Byrd account or go to [email or web address]  Consent: (Patient) Brianna Byrd provided verbal consent for this virtual visit at the beginning of the encounter.  Current Medications:  Current Outpatient Medications:    albuterol  (VENTOLIN  HFA) 108 (90 Base) MCG/ACT inhaler, Inhale 2 puffs into the lungs every 6 (six) hours as needed for wheezing or shortness of breath., Disp: 8 g, Rfl: 0   benzonatate  (TESSALON ) 100 MG capsule, Take 1 capsule (100 mg total) by mouth 3 (three) times daily as needed for cough., Disp: 30 capsule, Rfl: 0   Cholecalciferol (VITAMIN D3) 50 MCG (2000 UT) capsule, Take 1 capsule by mouth daily., Disp: , Rfl:    cyclobenzaprine  (FLEXERIL ) 10 MG tablet, Take 10 mg by mouth 3 (three) times daily as needed., Disp: , Rfl:    EPINEPHrine 0.3 mg/0.3 mL IJ SOAJ injection, Inject 0.3 mg into the muscle as needed., Disp: , Rfl:    fluconazole  (DIFLUCAN ) 150 MG tablet, Take 1 tablet (150 mg total) by mouth every 3 (three) days as needed., Disp: 2 tablet, Rfl: 0   Fluocinolone  Acetonide Scalp 0.01 % OIL, Apply topically at bedtime as needed, Disp: 118 mL, Rfl: 0   fluticasone  0.05%-ketoconazole  2% 1:1 cream mixture, Apply topically 2 (two) times daily as needed., Disp: 60 g, Rfl: 1   ketoconazole  (NIZORAL ) 2 % cream, Apply 1 Application topically daily., Disp: 15 g, Rfl: 0   metoprolol  tartrate (LOPRESSOR ) 25 MG tablet, Take 1 tablet (25 mg total) by mouth daily as needed (heart rate >120 bpm)., Disp: 20  tablet, Rfl: 0   pantoprazole  (PROTONIX ) 20 MG tablet, Take 1 tablet (20 mg total) by mouth daily for 14 days., Disp: 14 tablet, Rfl: 0   potassium chloride  SA (KLOR-CON  M) 20 MEQ tablet, Take 1 tablet (20 mEq total) by mouth daily for 7 days., Disp: 7 tablet, Rfl: 0   sucralfate  (CARAFATE ) 1 g tablet, Take 1 tablet (1 g total) by mouth 4 (four) times daily -  with meals and at bedtime for 7 days., Disp: 28 tablet, Rfl: 0   valACYclovir  (VALTREX ) 500 MG tablet, Take 500 mg by mouth 2 (two) times daily., Disp: , Rfl:    Medications ordered in this encounter:  No orders of the defined types were placed in this encounter.    *If you need refills on other medications prior to your next appointment, please contact your pharmacy*  Follow-Up: Call back or seek an in-person evaluation if the symptoms worsen or if the condition fails to improve as anticipated.  [facility] [phone]  Other Instructions Dental Abscess  A dental abscess is an area of pus in or around a tooth. It comes from an infection. It can cause pain and other symptoms. Treatment will help with symptoms and prevent the infection from spreading. What are the causes? This condition is caused by an infection in or around the tooth. This can be from: Very bad tooth decay (cavities). A bad injury to the tooth, such as a broken or chipped tooth.  What increases the risk? The risk to get an abscess is higher in males. It is also more likely in people who: Have dental decay. Have very bad gum disease. Eat sugary snacks between meals. Use tobacco. Have diabetes. Have a weak disease-fighting system (immune system). Do not brush their teeth regularly. What are the signs or symptoms? Some mild symptoms are: Tenderness. Bad breath. Fever. A sharp, sour taste in the mouth. Pain in and around the infected tooth. Worse symptoms of this condition include: Swollen neck glands. Chills. Pus draining around the  tooth. Swelling and redness around the tooth, the mouth, or the face. Very bad pain in and around the tooth. The worst symptoms can include: Difficulty swallowing. Difficulty opening your mouth. Feeling like you may vomit or vomiting. How is this treated? This is treated by getting rid of the infection. Your dentist will discuss ways to do this, including: Antibiotic medicines. Antibacterial mouth rinse. An incision in the abscess to drain out the pus. A root canal. Removing the tooth. Follow these instructions at home: Medicines Take over-the-counter and prescription medicines only as told by your dentist. If you were prescribed an antibiotic medicine, take it as told by your dentist. Do not stop taking it even if you start to feel better. If you were prescribed a gel that has numbing medicine in it, use it exactly as told. Ask your dentist if you should avoid driving or using machines while you are taking your medicine. General instructions Rinse your mouth often with salt water. To make salt water, dissolve -1 tsp (3-6 g) of salt in 1 cup (237 mL) of warm water. Eat a soft diet while your mouth is healing. Drink enough fluid to keep your pee (urine) pale yellow. Do not apply heat to the outside of your mouth. Do not smoke or use any products that contain nicotine or tobacco. If you need help quitting, ask your dentist. Keep all follow-up visits. Prevent an abscess Brush your teeth every morning and every night. Use fluoride toothpaste. Floss your teeth each day. Get dental cleanings as often as told by your dentist. Think about getting dental sealant put on teeth that have deep holes (decay). Drink water that has fluoride in it. Most tap water has fluoride. Check the label on bottled water to see if it has fluoride in it. Drink water instead of sugary drinks. Eat healthy meals and snacks. Wear a mouth guard or face shield when you play sports. Contact a doctor if: Your pain  is worse and medicine does not help. Get help right away if: You have a fever or chills. Your symptoms suddenly get worse. You have a very bad headache. You have problems breathing or swallowing. You have trouble opening your mouth. You have swelling in your neck or close to your eye. These symptoms may be an emergency. Get help right away. Call your local emergency services (911 in the [place] Do not wait to see if the symptoms will go away. Do not drive yourself to the hospital. Summary A dental abscess is an area of pus in or around a tooth. It is caused by an infection. Treatment will help with symptoms and prevent the infection from spreading. Take over-the-counter and prescription medicines only as told by your dentist. To prevent an abscess, take good care of your teeth. Brush your teeth every morning and night. Use floss every day. Get dental cleanings as often as told by your dentist. This information is not intended to replace  advice given to you by your health care provider. Make sure you discuss any questions you have with your health care provider. Document Revised: 05/11/2020 Document Reviewed: 05/12/2020 Elsevier Patient Education  2024 Elsevier Inc.   If you have been instructed to have an in-person evaluation today at a local Urgent Care facility, please use the link below. It will take you to a list of all of our available [facility], including address, phone number and hours of operation. Please do not delay care.  [facility]  If you or a family member do not have a primary care provider, use the link below to schedule a visit and establish care. When you choose a [facility] primary care physician or advanced practice provider, you gain a long-term partner in health. Find a Primary Care Provider  Learn more about [facility] in-office and virtual care options: [facility] - Get Care Now

## 2024-01-05 ENCOUNTER — Telehealth: Admitting: Nurse Practitioner

## 2024-01-05 DIAGNOSIS — R399 Unspecified symptoms and signs involving the genitourinary system: Secondary | ICD-10-CM

## 2024-01-05 DIAGNOSIS — K900 Celiac disease: Secondary | ICD-10-CM

## 2024-01-05 MED ORDER — PANTOPRAZOLE SODIUM 20 MG PO TBEC: 20.0000 mg | DELAYED_RELEASE_TABLET | Freq: Every day | ORAL | 0 refills | Status: AC

## 2024-01-05 MED ORDER — NITROFURANTOIN MONOHYD MACRO 100 MG PO CAPS: 100.0000 mg | ORAL_CAPSULE | Freq: Two times a day (BID) | ORAL | 0 refills | Status: AC

## 2024-01-05 NOTE — Progress Notes (Signed)
 Virtual Visit Consent   Brianna Byrd, you are scheduled for a virtual visit with a [facility] provider today. Just as with appointments in the office, your consent must be obtained to participate. Your consent will be active for this visit and any virtual visit you may have with one of our providers in the next 365 days. If you have a MyChart account, a copy of this consent can be sent to you electronically.  As this is a virtual visit, video technology does not allow for your provider to perform a traditional examination. This may limit your provider's ability to fully assess your condition. If your provider identifies any concerns that need to be evaluated in person or the need to arrange testing (such as labs, EKG, etc.), we will make arrangements to do so. Although advances in technology are sophisticated, we cannot ensure that it will always work on either your end or our end. If the connection with a video visit is poor, the visit may have to be switched to a telephone visit. With either a video or telephone visit, we are not always able to ensure that we have a secure connection.  By engaging in this virtual visit, you consent to the provision of healthcare and authorize for your insurance to be billed (if applicable) for the services provided during this visit. Depending on your insurance coverage, you may receive a charge related to this service.  I need to obtain your verbal consent now. Are you willing to proceed with your visit today? Brianna Byrd has provided verbal consent on 01/05/2024 for a virtual visit (video or telephone). Brianna LELON Servant, NP  Date: 01/05/2024 12:11 PM   Virtual Visit via Video Note   I, Brianna Byrd, connected with  Brianna Byrd  ([id number], [birth year]) on 01/05/24 at 11:45 AM EDT by a video-enabled telemedicine application and verified that I am speaking with the correct person using two identifiers.  Location: Patient: Virtual Visit  Location Patient: Home Provider: Virtual Visit Location Provider: Home Office   I discussed the limitations of evaluation and management by telemedicine and the availability of in person appointments. The patient expressed understanding and agreed to proceed.    History of Present Illness: Brianna Byrd is a 28 y.o. who identifies as a female who was assigned female at birth, and is being seen today for UTI symptoms.  Over the past 3 days Brianna Byrd has been experiencing lower pelvic pressure and pain along with urinary urgency and dysuria.  She denies nausea or vomiting, fever or back pain.  P    Problems:  Patient Active Problem List   Diagnosis Date Noted   Sinus tachycardia 10/18/2022   Anemia 10/18/2022   Celiac disease 10/18/2022   Witnessed apneic spells 08/30/2020   Chronic tension-type headache 07/28/2020   Daytime somnolence 07/28/2020   Obesity 07/28/2020   Vitamin B12 deficiency 07/28/2020   Heartburn 07/30/2019   Migraine without aura and without status migrainosus, not intractable 03/04/2019   Left breast mass 01/27/2019   Amenorrhea 08/26/2017   Goiter diffuse 08/26/2017   History of pre-eclampsia 02/12/2017   SVD (spontaneous vaginal delivery) 12/26/2016   Psychophysiologic insomnia 12/21/2016   Trichomonal vaginitis during pregnancy, antepartum 06/05/2016   Constitutional short stature 05/14/2016    Allergies:  Allergies  Allergen Reactions   Fruit Extracts Shortness Of Breath, Swelling and Other (See Comments)    Anything tropical- Mouth swells   Gluten Meal Diarrhea, Nausea And Vomiting and Other (See Comments)  HAS CELIAC DISEASE   Peach [Prunus Persica] Shortness Of Breath, Swelling and Other (See Comments)    Mouth swells   Compazine  [Prochlorperazine ] Anxiety and Other (See Comments)    Severe akathisia (use ativan  instead of diphenhydramine  for H/A cocktails)     Miconazole Swelling and Other (See Comments)    Can use Diflucan  only    Latex Other (See Comments)    Reaction is worse than a rash   Tramadol Other (See Comments)    Hallucinations    Medications:  Current Outpatient Medications:    amoxicillin -clavulanate (AUGMENTIN ) 400-57 MG/5ML suspension, Take PO 10 mL twice daily for 10 days., Disp: 100 mL, Rfl: 0   chlorhexidine  (PERIDEX ) 0.12 % solution, Use as directed 15 mLs in the mouth or throat 2 (two) times daily., Disp: 120 mL, Rfl: 0   Cholecalciferol (VITAMIN D3) 50 MCG (2000 UT) capsule, Take 1 capsule by mouth daily., Disp: , Rfl:    cyclobenzaprine  (FLEXERIL ) 10 MG tablet, Take 10 mg by mouth 3 (three) times daily as needed., Disp: , Rfl:    EPINEPHrine 0.3 mg/0.3 mL IJ SOAJ injection, Inject 0.3 mg into the muscle as needed., Disp: , Rfl:    fluconazole  (DIFLUCAN ) 150 MG tablet, Take 1 tablet (150 mg total) by mouth every 3 (three) days as needed., Disp: 2 tablet, Rfl: 0   Fluocinolone  Acetonide Scalp 0.01 % OIL, Apply topically at bedtime as needed, Disp: 118 mL, Rfl: 0   fluticasone  0.05%-ketoconazole  2% 1:1 cream mixture, Apply topically 2 (two) times daily as needed., Disp: 60 g, Rfl: 1   ketoconazole  (NIZORAL ) 2 % cream, Apply 1 Application topically daily., Disp: 15 g, Rfl: 0   metoprolol  tartrate (LOPRESSOR ) 25 MG tablet, Take 1 tablet (25 mg total) by mouth daily as needed (heart rate >120 bpm)., Disp: 20 tablet, Rfl: 0   nitrofurantoin , macrocrystal-monohydrate, (MACROBID ) 100 MG capsule, Take 1 capsule (100 mg total) by mouth 2 (two) times daily for 5 days., Disp: 10 capsule, Rfl: 0   pantoprazole  (PROTONIX ) 20 MG tablet, Take 1 tablet (20 mg total) by mouth daily., Disp: 30 tablet, Rfl: 0   potassium chloride  SA (KLOR-CON  M) 20 MEQ tablet, Take 1 tablet (20 mEq total) by mouth daily for 7 days., Disp: 7 tablet, Rfl: 0   sucralfate  (CARAFATE ) 1 g tablet, Take 1 tablet (1 g total) by mouth 4 (four) times daily -  with meals and at bedtime for 7 days., Disp: 28 tablet, Rfl: 0   valACYclovir  (VALTREX )  500 MG tablet, Take 500 mg by mouth 2 (two) times daily., Disp: , Rfl:   Observations/Objective: Patient is well-developed, well-nourished in no acute distress.  Resting comfortably at home.  Head is normocephalic, atraumatic.  No labored breathing.  Speech is clear and coherent with logical content.  Patient is alert and oriented at baseline.    Assessment and Plan: 1. UTI symptoms (Primary) - nitrofurantoin , macrocrystal-monohydrate, (MACROBID ) 100 MG capsule; Take 1 capsule (100 mg total) by mouth 2 (two) times daily for 5 days.  Dispense: 10 capsule; Refill: 0  2. Celiac disease - pantoprazole  (PROTONIX ) 20 MG tablet; Take 1 tablet (20 mg total) by mouth daily.  Dispense: 30 tablet; Refill: 0    Follow Up Instructions: I discussed the assessment and treatment plan with the patient. The patient was provided an opportunity to ask questions and all were answered. The patient agreed with the plan and demonstrated an understanding of the instructions.  A copy of instructions were sent  to the patient via MyChart unless otherwise noted below.     The patient was advised to call back or seek an in-person evaluation if the symptoms worsen or if the condition fails to improve as anticipated.    Joei Frangos W Matilde Pottenger, NP

## 2024-01-05 NOTE — Patient Instructions (Signed)
 Sherrye Lehr, thank you for joining [name], NP for today's virtual visit.  While this provider is not your primary care provider (PCP), if your PCP is located in our provider database this encounter information will be shared with them immediately following your visit.   A [facility] MyChart account gives you access to today's visit and all your visits, tests, and labs performed at [facility]  click here if you don't have a [facility] MyChart account or go to [email or web address]  Consent: (Patient) Brianna Byrd provided verbal consent for this virtual visit at the beginning of the encounter.  Current Medications:  Current Outpatient Medications:    nitrofurantoin , macrocrystal-monohydrate, (MACROBID ) 100 MG capsule, Take 1 capsule (100 mg total) by mouth 2 (two) times daily for 5 days., Disp: 10 capsule, Rfl: 0   amoxicillin -clavulanate (AUGMENTIN ) 400-57 MG/5ML suspension, Take PO 10 mL twice daily for 10 days., Disp: 100 mL, Rfl: 0   chlorhexidine  (PERIDEX ) 0.12 % solution, Use as directed 15 mLs in the mouth or throat 2 (two) times daily., Disp: 120 mL, Rfl: 0   Cholecalciferol (VITAMIN D3) 50 MCG (2000 UT) capsule, Take 1 capsule by mouth daily., Disp: , Rfl:    cyclobenzaprine  (FLEXERIL ) 10 MG tablet, Take 10 mg by mouth 3 (three) times daily as needed., Disp: , Rfl:    EPINEPHrine 0.3 mg/0.3 mL IJ SOAJ injection, Inject 0.3 mg into the muscle as needed., Disp: , Rfl:    fluconazole  (DIFLUCAN ) 150 MG tablet, Take 1 tablet (150 mg total) by mouth every 3 (three) days as needed., Disp: 2 tablet, Rfl: 0   Fluocinolone  Acetonide Scalp 0.01 % OIL, Apply topically at bedtime as needed, Disp: 118 mL, Rfl: 0   fluticasone  0.05%-ketoconazole  2% 1:1 cream mixture, Apply topically 2 (two) times daily as needed., Disp: 60 g, Rfl: 1   ketoconazole  (NIZORAL ) 2 % cream, Apply 1 Application topically daily., Disp: 15 g, Rfl: 0   metoprolol  tartrate (LOPRESSOR ) 25  MG tablet, Take 1 tablet (25 mg total) by mouth daily as needed (heart rate >120 bpm)., Disp: 20 tablet, Rfl: 0   pantoprazole  (PROTONIX ) 20 MG tablet, Take 1 tablet (20 mg total) by mouth daily., Disp: 30 tablet, Rfl: 0   potassium chloride  SA (KLOR-CON  M) 20 MEQ tablet, Take 1 tablet (20 mEq total) by mouth daily for 7 days., Disp: 7 tablet, Rfl: 0   sucralfate  (CARAFATE ) 1 g tablet, Take 1 tablet (1 g total) by mouth 4 (four) times daily -  with meals and at bedtime for 7 days., Disp: 28 tablet, Rfl: 0   valACYclovir  (VALTREX ) 500 MG tablet, Take 500 mg by mouth 2 (two) times daily., Disp: , Rfl:    Medications ordered in this encounter:  Meds ordered this encounter  Medications   nitrofurantoin , macrocrystal-monohydrate, (MACROBID ) 100 MG capsule    Sig: Take 1 capsule (100 mg total) by mouth 2 (two) times daily for 5 days.    Dispense:  10 capsule    Refill:  0    Supervising Provider:   BLAISE ALEENE KIDD [[id number]]   pantoprazole  (PROTONIX ) 20 MG tablet    Sig: Take 1 tablet (20 mg total) by mouth daily.    Dispense:  30 tablet    Refill:  0    Supervising Provider:   LAMPTEY, PHILIP O [phone]     *If you need refills on other medications prior to your next appointment, please contact your pharmacy*  Follow-Up: Call back or seek an  in-person evaluation if the symptoms worsen or if the condition fails to improve as anticipated.  [facility] [phone]  Other Instructions    If you have been instructed to have an in-person evaluation today at a local Urgent Care facility, please use the link below. It will take you to a list of all of our available [facility], including address, phone number and hours of operation. Please do not delay care.  [facility]  If you or a family member do not have a primary care provider, use the link below to schedule a visit and establish care. When you choose a [facility] primary care physician or  advanced practice provider, you gain a long-term partner in health. Find a Primary Care Provider  Learn more about [facility]'s in-office and virtual care options: [facility] - Get Care Now
